# Patient Record
Sex: Female | Born: 1945
Health system: Southern US, Community
[De-identification: ages and names within clinical notes are randomized; demographics above are authoritative.]

## PROBLEM LIST (undated history)

## (undated) DIAGNOSIS — F0390 Unspecified dementia without behavioral disturbance: Secondary | ICD-10-CM

## (undated) DIAGNOSIS — E785 Hyperlipidemia, unspecified: Secondary | ICD-10-CM

## (undated) DIAGNOSIS — I10 Essential (primary) hypertension: Secondary | ICD-10-CM

## (undated) HISTORY — DX: Unspecified dementia, unspecified severity, without behavioral disturbance, psychotic disturbance, mood disturbance, and anxiety: F03.90

## (undated) HISTORY — DX: Hyperlipidemia, unspecified: E78.5

## (undated) HISTORY — DX: Essential (primary) hypertension: I10

---

## 2013-03-15 DIAGNOSIS — F919 Conduct disorder, unspecified: Secondary | ICD-10-CM | POA: Diagnosis not present

## 2013-03-15 DIAGNOSIS — F329 Major depressive disorder, single episode, unspecified: Secondary | ICD-10-CM | POA: Diagnosis not present

## 2013-03-15 DIAGNOSIS — F3289 Other specified depressive episodes: Secondary | ICD-10-CM | POA: Diagnosis not present

## 2013-03-17 DIAGNOSIS — F3289 Other specified depressive episodes: Secondary | ICD-10-CM | POA: Diagnosis not present

## 2013-03-17 DIAGNOSIS — F0280 Dementia in other diseases classified elsewhere without behavioral disturbance: Secondary | ICD-10-CM | POA: Diagnosis not present

## 2013-03-17 DIAGNOSIS — F509 Eating disorder, unspecified: Secondary | ICD-10-CM | POA: Diagnosis not present

## 2013-03-17 DIAGNOSIS — Z560 Unemployment, unspecified: Secondary | ICD-10-CM | POA: Diagnosis not present

## 2013-03-17 DIAGNOSIS — F339 Major depressive disorder, recurrent, unspecified: Secondary | ICD-10-CM | POA: Diagnosis not present

## 2013-03-17 DIAGNOSIS — G309 Alzheimer's disease, unspecified: Secondary | ICD-10-CM | POA: Diagnosis not present

## 2013-03-17 DIAGNOSIS — R636 Underweight: Secondary | ICD-10-CM | POA: Diagnosis not present

## 2013-03-17 DIAGNOSIS — Z681 Body mass index (BMI) 19 or less, adult: Secondary | ICD-10-CM | POA: Diagnosis not present

## 2013-03-17 DIAGNOSIS — F329 Major depressive disorder, single episode, unspecified: Secondary | ICD-10-CM | POA: Diagnosis not present

## 2013-06-12 DIAGNOSIS — F068 Other specified mental disorders due to known physiological condition: Secondary | ICD-10-CM | POA: Diagnosis not present

## 2013-06-12 DIAGNOSIS — Z0000 Encounter for general adult medical examination without abnormal findings: Secondary | ICD-10-CM | POA: Diagnosis not present

## 2013-06-25 DIAGNOSIS — F329 Major depressive disorder, single episode, unspecified: Secondary | ICD-10-CM | POA: Diagnosis not present

## 2013-06-25 DIAGNOSIS — F3289 Other specified depressive episodes: Secondary | ICD-10-CM | POA: Diagnosis not present

## 2013-09-15 DIAGNOSIS — F0393 Unspecified dementia, unspecified severity, with mood disturbance: Secondary | ICD-10-CM | POA: Diagnosis not present

## 2013-09-15 DIAGNOSIS — F0390 Unspecified dementia without behavioral disturbance: Secondary | ICD-10-CM | POA: Diagnosis not present

## 2013-12-16 ENCOUNTER — Ambulatory Visit: Payer: Medicare Other | Attending: Family Medicine | Admitting: Family Medicine

## 2013-12-16 ENCOUNTER — Encounter: Payer: Self-pay | Admitting: Family Medicine

## 2013-12-16 VITALS — BP 122/71 | HR 72 | Temp 98.0°F | Resp 18 | Ht 68.0 in | Wt 113.0 lb

## 2013-12-16 DIAGNOSIS — Z23 Encounter for immunization: Secondary | ICD-10-CM

## 2013-12-16 DIAGNOSIS — F0390 Unspecified dementia without behavioral disturbance: Secondary | ICD-10-CM | POA: Insufficient documentation

## 2013-12-16 DIAGNOSIS — E785 Hyperlipidemia, unspecified: Secondary | ICD-10-CM

## 2013-12-16 DIAGNOSIS — F0391 Unspecified dementia with behavioral disturbance: Secondary | ICD-10-CM | POA: Diagnosis not present

## 2013-12-16 DIAGNOSIS — I10 Essential (primary) hypertension: Secondary | ICD-10-CM

## 2013-12-16 DIAGNOSIS — Z760 Encounter for issue of repeat prescription: Secondary | ICD-10-CM | POA: Insufficient documentation

## 2013-12-16 LAB — LIPID PANEL
Cholesterol: 220 mg/dL — ABNORMAL HIGH (ref 0–200)
HDL: 61 mg/dL (ref >39)
LDL CALC: 139 mg/dL — AB (ref 0–99)
TRIGLYCERIDES: 98 mg/dL (ref <150)
Total CHOL/HDL Ratio: 3.6 Ratio
VLDL: 20 mg/dL (ref 0–40)

## 2013-12-16 LAB — COMPLETE METABOLIC PANEL WITHOUT GFR
ALT: 21 U/L (ref 0–35)
AST: 29 U/L (ref 0–37)
Albumin: 4.7 g/dL (ref 3.5–5.2)
Alkaline Phosphatase: 63 U/L (ref 39–117)
BUN: 21 mg/dL (ref 6–23)
CO2: 30 meq/L (ref 19–32)
Calcium: 9.5 mg/dL (ref 8.4–10.5)
Chloride: 100 meq/L (ref 96–112)
Creat: 0.94 mg/dL (ref 0.50–1.10)
GFR, Est African American: 73 mL/min
GFR, Est Non African American: 63 mL/min
Glucose, Bld: 137 mg/dL — ABNORMAL HIGH (ref 70–99)
Potassium: 4.4 meq/L (ref 3.5–5.3)
Sodium: 141 meq/L (ref 135–145)
Total Bilirubin: 0.5 mg/dL (ref 0.2–1.2)
Total Protein: 7.9 g/dL (ref 6.0–8.3)

## 2013-12-16 LAB — VITAMIN B12: Vitamin B-12: 1316 pg/mL — ABNORMAL HIGH (ref 211–911)

## 2013-12-16 MED ORDER — SIMVASTATIN 20 MG PO TABS: 20.0000 mg | ORAL_TABLET | Freq: Every day | ORAL | Status: DC

## 2013-12-16 MED ORDER — HYDROCHLOROTHIAZIDE 25 MG PO TABS: 25.0000 mg | ORAL_TABLET | Freq: Every day | ORAL | Status: DC

## 2013-12-16 MED ORDER — DONEPEZIL HCL 10 MG PO TABS: 10.0000 mg | ORAL_TABLET | Freq: Every day | ORAL | Status: DC

## 2013-12-16 NOTE — Assessment & Plan Note (Addendum)
A: normal BP P:  Refilled HCTZ CMP

## 2013-12-16 NOTE — Assessment & Plan Note (Signed)
A: stable on aricept. Check B12, HIV.  P: Refilled aricept  MMSE at f/u

## 2013-12-16 NOTE — Progress Notes (Signed)
Establish care Medicine refill
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
## 2013-12-16 NOTE — Assessment & Plan Note (Signed)
A: on statin P: Check lipids Refill simvastatin

## 2013-12-16 NOTE — Patient Instructions (Signed)
Deanna [name],  Thank you for coming in today. It was a pleasure meeting you. I look forward to being your primary doctor.   I have sent refills for your medications to the onsite pharmacy.   You will be called with lab results  F/u in 3 months for hypertension and dementia, sooner if needed.   Recommend screening: mammogram and colonoscopy.   Dr. Adrian Blackwater

## 2013-12-17 LAB — HIV ANTIBODY (ROUTINE TESTING W REFLEX): HIV 1&2 Ab, 4th Generation: NONREACTIVE

## 2013-12-22 ENCOUNTER — Telehealth: Payer: Self-pay | Admitting: Family Medicine

## 2013-12-22 ENCOUNTER — Telehealth: Payer: Self-pay | Admitting: *Deleted

## 2013-12-22 NOTE — Telephone Encounter (Signed)
Message copied by Betti Cruz on Tue Dec 22, 2013  2:05 PM ------      Message from: Boykin Nearing      Created: Tue Dec 22, 2013 12:09 PM       Screening for dementia labs: HIV negative, B12 normal       CMP- normal       Cholesterol high, continue simvastatin, we will repeat LDL  in 6 months ------

## 2013-12-22 NOTE — Telephone Encounter (Signed)
Pt returning nurse's call, please f/u with pt.   °

## 2013-12-22 NOTE — Telephone Encounter (Signed)
Unable to LVM, voice message full

## 2013-12-24 ENCOUNTER — Telehealth: Payer: Self-pay | Admitting: Family Medicine

## 2013-12-24 NOTE — Telephone Encounter (Signed)
Pt's son calling back in regards to phone call the pt. Received from [place], please f/u with pt.

## 2013-12-25 ENCOUNTER — Telehealth: Payer: Self-pay | Admitting: Emergency Medicine

## 2013-12-25 NOTE — Telephone Encounter (Signed)
Pt son given labs results with instructions to diet/exercise

## 2014-03-11 ENCOUNTER — Telehealth: Payer: Self-pay | Admitting: Family Medicine

## 2014-03-11 ENCOUNTER — Encounter: Payer: Self-pay | Admitting: *Deleted

## 2014-03-11 ENCOUNTER — Telehealth: Payer: Self-pay | Admitting: *Deleted

## 2014-03-11 ENCOUNTER — Ambulatory Visit: Payer: Medicare Other | Attending: Family Medicine | Admitting: Family Medicine

## 2014-03-11 ENCOUNTER — Encounter: Payer: Self-pay | Admitting: Family Medicine

## 2014-03-11 VITALS — BP 107/72 | HR 78 | Temp 97.5°F | Resp 16 | Ht 68.0 in | Wt 110.0 lb

## 2014-03-11 DIAGNOSIS — R634 Abnormal weight loss: Secondary | ICD-10-CM | POA: Diagnosis not present

## 2014-03-11 DIAGNOSIS — F0390 Unspecified dementia without behavioral disturbance: Secondary | ICD-10-CM | POA: Insufficient documentation

## 2014-03-11 DIAGNOSIS — H43391 Other vitreous opacities, right eye: Secondary | ICD-10-CM | POA: Diagnosis not present

## 2014-03-11 DIAGNOSIS — R636 Underweight: Secondary | ICD-10-CM | POA: Diagnosis not present

## 2014-03-11 DIAGNOSIS — I10 Essential (primary) hypertension: Secondary | ICD-10-CM | POA: Diagnosis not present

## 2014-03-11 DIAGNOSIS — E785 Hyperlipidemia, unspecified: Secondary | ICD-10-CM | POA: Insufficient documentation

## 2014-03-11 MED ORDER — ENSURE PLUS PO LIQD: 237.0000 mL | Freq: Two times a day (BID) | ORAL | Status: DC

## 2014-03-11 MED ORDER — MEMANTINE HCL 5 MG PO TABS
5.0000 mg | ORAL_TABLET | Freq: Two times a day (BID) | ORAL | Status: DC
Start: 2014-03-11 — End: 2014-03-11

## 2014-03-11 MED ORDER — HYDROCHLOROTHIAZIDE 12.5 MG PO TABS: 12.5000 mg | ORAL_TABLET | Freq: Every day | ORAL | Status: DC

## 2014-03-11 NOTE — Progress Notes (Signed)
   Subjective:    Patient ID: Deanna [name], female    DOB: [birth year], 69 y.o.   MRN: [id number] CC: f/u HTN, HLD,  and dementia  HPI 69 yo F presents with her son whom she lives with for f/u:  1. HTN: taking HCTZ. No headache, chest pain or shortness of breath. No syncope, presyncope or dizziness.   2. Dementia: taking aricept. Has some confusion around the house, like when walking to restroom. No mood lability. Cannot recall recent events clearly. Living with son. Doing crossword puzzles. Planning to volunteer outside of the home. Appetite is fair. Was taking ensure supplements prior to 2 month visit to [place] to stay with her sisters. Interested in seeing as neurologist for additional evaluation and treatment of dementia.   3. Floaters in R eye: x 2-3 weeks. No pain. No flashing lights. No loss of vision. Has worn reading glasses in the past.   Soc Hx: non smoker  Review of Systems As per HPI     Objective:   Physical Exam BP 107/72 mmHg  Pulse 78  Temp(Src) 97.5 F (36.4 C) (Oral)  Resp 16  Ht 5\' 8"  (1.727 m)  Wt 110 lb (49.896 kg)  BMI 16.73 kg/m2  SpO2 99%  Wt Readings from Last 3 Encounters:  03/11/14 110 lb (49.896 kg)  12/16/13 113 lb (51.256 kg)    BP Readings from Last 3 Encounters:  03/11/14 107/72  12/16/13 122/71  General appearance: alert, cooperative, appears older than stated age and no distress, thin  Eyes: negative findings: lids and lashes normal and conjunctivae and sclerae normal, positive findings: pupillary abnormality: constricted b/l, slightly cloudy on the R.  Lungs: clear to auscultation bilaterally Heart: regular rate and rhythm, S1, S2 normal, no murmur, click, rub or gallop Extremities: extremities normal, atraumatic, no cyanosis or edema  Visual acuity 20/50 both eyes.       Assessment & Plan:

## 2014-03-11 NOTE — Progress Notes (Signed)
Pt comes in for 3 mnth f/u Htn,Cholesterol and Dementia Pt is compliant with taking medications daily Need refills on all meds Son is requesting Neuro referral for worsening Dementia Poor appetite noted with 3lb weight loss from last visit

## 2014-03-11 NOTE — Patient Instructions (Signed)
Deanna [name],  Thank you for coming back to see me,   1. HTN: BP very well controlled. If you develop dizziness, lightheadedness please call me right away.  Drink plenty of water. Due for repeat labs in 06/2013.   2. Dementia: Adding namenda with goal of slowing progression, 5 mg once daiky for 10 days then twice daily (max dose is 20 mg daily divided into 10 mg doses).  Continue aricept Continue stimulating activites Neurology referral.  3. Floaters in R eye:  Opthalmology referral for dilated eye exam to rule out cataract and any potential problem in the back of your eye.  4. Weight loss: Add ensure 20 minutes before meals for appetite stimulation.   F/u in 3 months  Dr. Adrian Blackwater

## 2014-03-11 NOTE — Assessment & Plan Note (Signed)
A: Dementia: persistent. Suspect slight progression. Still able to perform ADLs.  P: presribed namenda, but the co-pay was around $300. namenda on hold for now.  Continue aricept Continue stimulating activities, adding more community activities.  Neurology referral.

## 2014-03-11 NOTE — Assessment & Plan Note (Signed)
A: BP well controlled. No signs of orthostasis. SBP down to 107 from 122.  P: Continue HCTZ, decrease to 12.5 mg daily.

## 2014-03-11 NOTE — Assessment & Plan Note (Signed)
Weight loss: Add ensure 20 minutes before meals for appetite stimulation.

## 2014-03-11 NOTE — Telephone Encounter (Signed)
Attempted to call patient. Unable to leave VM as mailbox full. Changes to treatment plan   1. Regarding dementia: since the namenda co-pay was so high (around $300) we will not start this medication now.  Perhaps the neurologist may have a more affordable medication suggestion or other treatment options. Referral placed.   2. Regarding BP: Well controlled But given SBP lower than previous 122 last visit to 107 today, please decrease HCTZ to 12.5 mg ( 1/2 of 25 mg tablet) daily. Dr. Adrian Blackwater does not want Deanna [name] to experience symptoms of low BP.   Call with questions.  Dr. Adrian Blackwater

## 2014-03-11 NOTE — Telephone Encounter (Signed)
Unable to contact Pt, voice mail is full Letter send to pt

## 2014-03-24 ENCOUNTER — Ambulatory Visit (INDEPENDENT_AMBULATORY_CARE_PROVIDER_SITE_OTHER): Payer: Medicare Other | Admitting: Neurology

## 2014-03-24 ENCOUNTER — Encounter: Payer: Self-pay | Admitting: Neurology

## 2014-03-24 VITALS — BP 112/65 | HR 77 | Ht 68.0 in | Wt 112.8 lb

## 2014-03-24 DIAGNOSIS — R300 Dysuria: Secondary | ICD-10-CM

## 2014-03-24 DIAGNOSIS — R5383 Other fatigue: Secondary | ICD-10-CM | POA: Insufficient documentation

## 2014-03-24 DIAGNOSIS — R4189 Other symptoms and signs involving cognitive functions and awareness: Secondary | ICD-10-CM | POA: Insufficient documentation

## 2014-03-24 DIAGNOSIS — R5382 Chronic fatigue, unspecified: Secondary | ICD-10-CM | POA: Diagnosis not present
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
## 2014-03-24 NOTE — Patient Instructions (Signed)
Overall you are doing fairly well but I do want to suggest a few things today:   Remember to drink plenty of fluid, eat healthy meals and do not skip any meals. Try to eat protein with a every meal and eat a healthy snack such as fruit or nuts in between meals. Try to keep a regular sleep-wake schedule and try to exercise daily, particularly in the form of walking, 20-30 minutes a day, if you can.   As far as your medications are concerned, I would like to suggest: Holding off on any new medications until workup is complete.   As far as diagnostic testing: MRI of the brain, lab tests, Neuropsychiatric testing  I would like to see you back in 3 months, sooner if we need to. Please call us with any interim questions, concerns, problems, updates or refill requests.   Please also call us for any test results so we can go over those with you on the phone.  My clinical assistant and will answer any of your questions and relay your messages to me and also relay most of my messages to you.   Our phone number is [phone]. We also have an after hours call service for urgent matters and there is a physician on-call for urgent questions. For any emergencies you know to call 911 or go to the nearest emergency room

## 2014-03-25 LAB — URINALYSIS, ROUTINE W REFLEX MICROSCOPIC
Bilirubin, UA: NEGATIVE
GLUCOSE, UA: NEGATIVE
Leukocytes, UA: NEGATIVE
NITRITE UA: NEGATIVE
Specific Gravity, UA: 1.027 (ref 1.005–1.030)
Urobilinogen, Ur: 0.2 mg/dL (ref 0.2–1.0)
pH, UA: 5.5 (ref 5.0–7.5)

## 2014-03-25 LAB — MICROSCOPIC EXAMINATION: Casts: NONE SEEN /lpf

## 2014-03-25 LAB — SPECIMEN STATUS REPORT

## 2014-03-26 LAB — CBC
HEMATOCRIT: 44 % (ref 34.0–46.6)
HEMOGLOBIN: 14.4 g/dL (ref 11.1–15.9)
MCH: 29.8 pg (ref 26.6–33.0)
MCHC: 32.7 g/dL (ref 31.5–35.7)
MCV: 91 fL (ref 79–97)
Platelets: 373 10*3/uL (ref 150–379)
RBC: 4.84 x10E6/uL (ref 3.77–5.28)
RDW: 13.3 % (ref 12.3–15.4)
WBC: 6.1 10*3/uL (ref 3.4–10.8)

## 2014-03-26 LAB — COMPREHENSIVE METABOLIC PANEL
ALT: 17 IU/L (ref 0–32)
AST: 26 IU/L (ref 0–40)
Albumin/Globulin Ratio: 1.8 (ref 1.1–2.5)
Albumin: 4.9 g/dL — ABNORMAL HIGH (ref 3.6–4.8)
Alkaline Phosphatase: 68 IU/L (ref 39–117)
BUN / CREAT RATIO: 17 (ref 11–26)
BUN: 19 mg/dL (ref 8–27)
CO2: 29 mmol/L (ref 18–29)
CREATININE: 1.11 mg/dL — AB (ref 0.57–1.00)
Calcium: 10.4 mg/dL — ABNORMAL HIGH (ref 8.7–10.3)
Chloride: 97 mmol/L (ref 97–108)
GFR calc non Af Amer: 52 mL/min/{1.73_m2} — ABNORMAL LOW (ref >59)
GFR, EST AFRICAN AMERICAN: 60 mL/min/{1.73_m2} (ref >59)
GLUCOSE: 114 mg/dL — AB (ref 65–99)
Globulin, Total: 2.8 g/dL (ref 1.5–4.5)
POTASSIUM: 5.6 mmol/L — AB (ref 3.5–5.2)
Sodium: 144 mmol/L (ref 134–144)
TOTAL PROTEIN: 7.7 g/dL (ref 6.0–8.5)
Total Bilirubin: 0.5 mg/dL (ref 0.0–1.2)

## 2014-03-26 LAB — THYROID PANEL WITH TSH
Free Thyroxine Index: 2.8 (ref 1.2–4.9)
T3 Uptake Ratio: 33 % (ref 24–39)
T4, Total: 8.4 ug/dL (ref 4.5–12.0)
TSH: 2.7 u[IU]/mL (ref 0.450–4.500)

## 2014-03-26 LAB — AMMONIA: Ammonia: 165 ug/dL (ref 19–87)

## 2014-03-26 LAB — VITAMIN B1, WHOLE BLOOD: Thiamine: 124.1 nmol/L (ref 66.5–200.0)

## 2014-03-26 LAB — SYPHILIS: RPR WITH REFLEX TO RPR TITER: RPR Ser Ql: NONREACTIVE

## 2014-03-26 LAB — SEDIMENTATION RATE: SED RATE: 6 mm/h (ref 0–40)

## 2014-03-26 LAB — METHYLMALONIC ACID, SERUM: Methylmalonic Acid: 167 nmol/L (ref 0–378)

## 2014-03-26 LAB — SPECIMEN STATUS REPORT

## 2014-04-05 ENCOUNTER — Telehealth: Payer: Self-pay | Admitting: Neurology

## 2014-04-05 NOTE — Telephone Encounter (Signed)
Deanna [name] - I have tried calling this patient a few times and and the voicemail is full so I can't leave a message. Can you try again tomorrow and if they do not answer, we will need to send them a letter. Her Ammonia level is high. I wanted to ask patient some more questions to figure out why that it (she doesn't have liver disease as far as I know but other things can cause high ammonia levels (GI bleeding, renal disease, alcohol, narcotics and others). This may the cause of her encephalopathy. I would like to repeat the test if you can get through to the patient or her son. Otherwise lets send a letter with the results and see if she can follow up with her primary care. Thank you.

## 2014-04-06 ENCOUNTER — Ambulatory Visit
Admission: RE | Admit: 2014-04-06 | Discharge: 2014-04-06 | Disposition: A | Discharge status: Home / Self Care | Payer: Medicare Other | Source: Ambulatory Visit | Attending: Neurology | Admitting: Neurology

## 2014-04-06 DIAGNOSIS — R4189 Other symptoms and signs involving cognitive functions and awareness: Secondary | ICD-10-CM

## 2014-04-06 NOTE — Telephone Encounter (Signed)
Tried calling patient. Voicemail inbox is full and I was unable to leave a message. I will go ahead and send a letter regarding lab results and to follow up were her PCP.

## 2014-04-07 LAB — COMPREHENSIVE DRUG ANALYSIS,UR: PDF: 0

## 2014-04-09 ENCOUNTER — Encounter: Payer: Self-pay | Admitting: *Deleted

## 2014-04-09 NOTE — Progress Notes (Signed)
Printed out MRI results and will mail them to the patient.

## 2014-04-16 NOTE — Telephone Encounter (Signed)
Pt's son is returning your call regarding lab results.  Please call back and advise.

## 2014-04-20 ENCOUNTER — Telehealth: Payer: Self-pay | Admitting: Family Medicine

## 2014-04-20 NOTE — Telephone Encounter (Signed)
Pt calling for MRI results, please f/u with pt.

## 2014-04-20 NOTE — Telephone Encounter (Signed)
Dr. Jaynee Eagles tried calling again and left a message with the lab results and MRI brain results. Told them to have her follow up with her PCP.

## 2014-04-25 NOTE — Telephone Encounter (Signed)
Patient to call [facility] [phone]  For results They have been trying to get ahold of patient to give results

## 2014-04-26 DIAGNOSIS — H43393 Other vitreous opacities, bilateral: Secondary | ICD-10-CM | POA: Diagnosis not present

## 2014-04-26 DIAGNOSIS — H2513 Age-related nuclear cataract, bilateral: Secondary | ICD-10-CM | POA: Diagnosis not present

## 2014-04-26 NOTE — Telephone Encounter (Signed)
Stated has results already

## 2014-05-07 ENCOUNTER — Telehealth: Payer: Self-pay | Admitting: Family Medicine

## 2014-05-07 ENCOUNTER — Telehealth: Payer: Self-pay | Admitting: Emergency Medicine

## 2014-05-07 NOTE — Telephone Encounter (Signed)
Called patient's son to clarify reason for FL2 form- [facility] vs SNF vs home care.  Patient and son pursuing ICF for dementia.   Please call patient's son.  FL2 for and copy of MRI report ready for pick up

## 2014-05-07 NOTE — Telephone Encounter (Signed)
FL2  

## 2014-05-11 NOTE — Telephone Encounter (Signed)
Patient called office and wanted the FL-2 faxed to  [phone] Attn [place] [facility] [place], same was sent.

## 2014-05-17 ENCOUNTER — Ambulatory Visit: Payer: Medicare Other

## 2014-05-19 ENCOUNTER — Ambulatory Visit: Payer: Medicare Other

## 2014-05-26 ENCOUNTER — Telehealth: Payer: Self-pay | Admitting: Emergency Medicine

## 2014-05-26 ENCOUNTER — Encounter: Payer: Self-pay | Admitting: Emergency Medicine

## 2014-05-26 NOTE — Telephone Encounter (Signed)
From [facility], ref state compliance order.

## 2014-06-10 ENCOUNTER — Ambulatory Visit: Payer: Medicare Other | Attending: Psychology | Admitting: Psychology

## 2014-06-10 DIAGNOSIS — F03A0 Unspecified dementia, mild, without behavioral disturbance, psychotic disturbance, mood disturbance, and anxiety: Secondary | ICD-10-CM

## 2014-06-10 DIAGNOSIS — F0390 Unspecified dementia without behavioral disturbance: Secondary | ICD-10-CM | POA: Diagnosis not present
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
## 2014-06-11 ENCOUNTER — Encounter: Payer: Self-pay | Admitting: Psychology

## 2014-06-15 ENCOUNTER — Telehealth: Payer: Self-pay | Admitting: Family Medicine

## 2014-06-15 ENCOUNTER — Ambulatory Visit: Payer: Medicare Other | Attending: Family Medicine | Admitting: Family Medicine

## 2014-06-15 ENCOUNTER — Encounter: Payer: Self-pay | Admitting: Family Medicine

## 2014-06-15 VITALS — BP 123/73 | HR 73 | Temp 98.3°F | Resp 18 | Ht 68.0 in | Wt 123.0 lb

## 2014-06-15 DIAGNOSIS — R252 Cramp and spasm: Secondary | ICD-10-CM | POA: Insufficient documentation

## 2014-06-15 DIAGNOSIS — E7220 Disorder of urea cycle metabolism, unspecified: Secondary | ICD-10-CM | POA: Insufficient documentation

## 2014-06-15 DIAGNOSIS — R7989 Other specified abnormal findings of blood chemistry: Secondary | ICD-10-CM | POA: Diagnosis not present

## 2014-06-15 DIAGNOSIS — F0390 Unspecified dementia without behavioral disturbance: Secondary | ICD-10-CM

## 2014-06-15 DIAGNOSIS — R109 Unspecified abdominal pain: Secondary | ICD-10-CM

## 2014-06-15 DIAGNOSIS — I10 Essential (primary) hypertension: Secondary | ICD-10-CM

## 2014-06-15 DIAGNOSIS — R799 Abnormal finding of blood chemistry, unspecified: Secondary | ICD-10-CM | POA: Diagnosis not present

## 2014-06-15 LAB — COMPLETE METABOLIC PANEL WITH GFR
ALK PHOS: 61 U/L (ref 39–117)
ALT: 54 U/L — ABNORMAL HIGH (ref 0–35)
AST: 33 U/L (ref 0–37)
Albumin: 4.4 g/dL (ref 3.5–5.2)
BUN: 18 mg/dL (ref 6–23)
CALCIUM: 9.5 mg/dL (ref 8.4–10.5)
CO2: 30 mEq/L (ref 19–32)
Chloride: 103 mEq/L (ref 96–112)
Creat: 0.91 mg/dL (ref 0.50–1.10)
GFR, EST NON AFRICAN AMERICAN: 65 mL/min
GFR, Est African American: 76 mL/min
Glucose, Bld: 90 mg/dL (ref 70–99)
POTASSIUM: 5.1 meq/L (ref 3.5–5.3)
Sodium: 143 mEq/L (ref 135–145)
Total Bilirubin: 0.5 mg/dL (ref 0.2–1.2)
Total Protein: 7.4 g/dL (ref 6.0–8.3)

## 2014-06-15 LAB — POCT URINALYSIS DIPSTICK
Bilirubin, UA: NEGATIVE
Glucose, UA: NEGATIVE
KETONES UA: NEGATIVE
LEUKOCYTES UA: NEGATIVE
Nitrite, UA: NEGATIVE
PH UA: 5.5
PROTEIN UA: NEGATIVE
SPEC GRAV UA: 1.025
Urobilinogen, UA: 1

## 2014-06-15 NOTE — Patient Instructions (Addendum)
  1. Elevated ammonia level: No asterixis on exam. Thought content is normal. Following instructions well.  Repeating  ammonia level, CMP-pending  UA Urine culture -pending  FOBT- negative   2. Hand cramping: Please stop hydrochlorothiazide. Blood pressure checks daily at [facility] for the next week.  Goal BP < 150/90 Will need a different BP medicine (norvasc 2.5 mg)  if BP > 150/90 x 2 days   You will be called with results  F/u in 3 months   Dr. Adrian Blackwater

## 2014-06-15 NOTE — Assessment & Plan Note (Signed)
A: nomotensive today with hand cramping. P: d/c HCTZ

## 2014-06-15 NOTE — Progress Notes (Signed)
F/U Complaining on cramping on fingers

## 2014-06-15 NOTE — Progress Notes (Signed)
   Subjective:    Patient ID: Deanna [name], female    DOB: October 26, 1945, 69 y.o.   MRN: [id number] CC: elevated ammonia levels, hand cramping  HPI 69 yo F with dementia, presents alone for OV brought in by driver from [facility]   1. Elevated ammonia level: denies confusion, dysuria, blood in stool.  2. Hand cramping: when holding the phone or gripping for prolonged time. Fingers only. No numbness. Just cramping.   Soc Hx: non smoker  Review of Systems  Constitutional: Negative.   Gastrointestinal: Negative for diarrhea, constipation and blood in stool.  Genitourinary: Positive for frequency. Negative for dysuria.  Musculoskeletal: Positive for myalgias.  Neurological: Negative.        Objective:   Physical Exam BP 123/73 mmHg  Pulse 73  Temp(Src) 98.3 F (36.8 C) (Oral)  Resp 18  Ht 5\' 8"  (1.727 m)  Wt 123 lb (55.792 kg)  BMI 18.71 kg/m2  SpO2 100%  Wt Readings from Last 3 Encounters:  06/15/14 123 lb (55.792 kg)  04/06/14 113 lb (51.256 kg)  03/24/14 112 lb 12.8 oz (51.166 kg)   BP Readings from Last 3 Encounters:  06/15/14 123/73  03/24/14 112/65  03/11/14 107/72    General appearance: alert, cooperative and no distress Eyes: conjunctivae/corneas clear. PERRL, EOM's intact.  Throat: lips, mucosa, and tongue normal; teeth and gums normal Back: mild L flank tenderness Lungs: clear to auscultation bilaterally Heart: regular rate and rhythm, S1, S2 normal, no murmur, click, rub or gallop Abdomen: soft, non-tender; bowel sounds normal; no masses,  no organomegaly  Rectal: normal tone, guaic negative stool in rectal vault  Neurologic: Alert and oriented X 3, normal strength and tone. Normal symmetric reflexes. Normal coordination and gait, negative asterixis       Assessment & Plan:

## 2014-06-15 NOTE — Assessment & Plan Note (Signed)
  1. Elevated ammonia level: No asterixis on exam. Thought content is normal. Following instructions well.  Repeating  ammonia level, CMP-pending  UA Urine culture -pending  FOBT- negative

## 2014-06-15 NOTE — Telephone Encounter (Signed)
Patient's son called back. I gave him an update of the plan and evaluation of elevated ammonia level (see office note). Also gave feedback about my concern that she came unaccompanied to the visit.  Son voices appreciation of feedback. He will likely come with his mom with future appointments.

## 2014-06-15 NOTE — Telephone Encounter (Signed)
Called patient's son. Left VM.  Stable exam today.  Evaluating elevated ammonia level- repeat blood work, UA and U culture, FOBT. Stopping HCTZ due to hand cramping.

## 2014-06-16 LAB — URINE CULTURE
COLONY COUNT: NO GROWTH
Organism ID, Bacteria: NO GROWTH

## 2014-06-16 LAB — AMMONIA: Ammonia: 60 umol/L — ABNORMAL HIGH (ref 16–53)

## 2014-06-17 ENCOUNTER — Telehealth: Payer: Self-pay | Admitting: *Deleted

## 2014-06-17 NOTE — Telephone Encounter (Signed)
-----   Message from Boykin Nearing, MD sent at 06/16/2014  9:04 AM EDT ----- Normal CMP except slightly elevated ALT, liver transaminase

## 2014-06-17 NOTE — Telephone Encounter (Signed)
Left voice message to return call 

## 2014-06-17 NOTE — Telephone Encounter (Signed)
-----   Message from Boykin Nearing, MD sent at 06/16/2014  2:22 PM EDT ----- Ammonia level down to 60. This is just slightly elevated. At normal ranges is 16-53. As long as urine culture normal, no further w/u needed

## 2014-07-20 ENCOUNTER — Encounter: Payer: Self-pay | Admitting: Neurology

## 2014-07-20 ENCOUNTER — Ambulatory Visit (INDEPENDENT_AMBULATORY_CARE_PROVIDER_SITE_OTHER): Payer: Medicare Other | Admitting: Neurology

## 2014-07-20 VITALS — BP 139/70 | HR 76 | Temp 98.1°F | Ht 68.0 in | Wt 132.8 lb

## 2014-07-20 DIAGNOSIS — F0390 Unspecified dementia without behavioral disturbance: Secondary | ICD-10-CM
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
## 2014-11-09 ENCOUNTER — Telehealth: Payer: Self-pay | Admitting: Family Medicine

## 2014-11-09 NOTE — Telephone Encounter (Signed)
Called [facility] to clarify request for flu shot on site during flu clinic scheduled for 12/13/14  Signed request for shot and faxed

## 2014-12-14 DIAGNOSIS — Z23 Encounter for immunization: Secondary | ICD-10-CM | POA: Diagnosis not present

## 2015-04-18 ENCOUNTER — Other Ambulatory Visit: Payer: Self-pay | Admitting: Family Medicine

## 2015-04-18 NOTE — Telephone Encounter (Signed)
Pt. Son called requesting medications refills on the following medications:  donepezil (ARICEPT) 10 MG tablet  simvastatin (ZOCOR) 20 MG tablet  Please f/u with pt.

## 2015-04-21 NOTE — Telephone Encounter (Signed)
Pt. Called requesting a med refill on the following medications:  simvastatin (ZOCOR) 20 MG tablet  donepezil (ARICEPT) 10 MG tablet   Please f/u with pt.

## 2015-04-22 MED FILL — SIMVASTATIN 20 MG TABLET: 20 | 30 days supply | Qty: 90 | Fill #0

## 2015-04-22 MED FILL — DONEPEZIL HCL 10 MG TABLET: 10 | 30 days supply | Qty: 30 | Fill #0

## 2015-05-23 MED FILL — DONEPEZIL HCL 10 MG TABLET: 10 | 30 days supply | Qty: 30 | Fill #1

## 2015-06-29 MED FILL — SIMVASTATIN 20 MG TABLET: 20 | 90 days supply | Qty: 90 | Fill #1

## 2015-07-15 MED FILL — DONEPEZIL HCL 10 MG TABLET: 10 | 30 days supply | Qty: 30 | Fill #2

## 2015-07-20 ENCOUNTER — Ambulatory Visit: Payer: Medicare Other | Admitting: Neurology

## 2015-08-03 ENCOUNTER — Encounter (HOSPITAL_COMMUNITY): Payer: Self-pay | Admitting: *Deleted

## 2015-08-03 ENCOUNTER — Emergency Department (HOSPITAL_COMMUNITY)
Admission: EM | Admit: 2015-08-03 | Discharge: 2015-08-04 | Disposition: A | Discharge status: Home / Self Care | Payer: Medicare Other | Attending: Emergency Medicine | Admitting: Emergency Medicine

## 2015-08-03 DIAGNOSIS — R441 Visual hallucinations: Secondary | ICD-10-CM | POA: Diagnosis present

## 2015-08-03 DIAGNOSIS — I10 Essential (primary) hypertension: Secondary | ICD-10-CM | POA: Diagnosis not present

## 2015-08-03 DIAGNOSIS — Z79899 Other long term (current) drug therapy: Secondary | ICD-10-CM | POA: Diagnosis not present

## 2015-08-03 DIAGNOSIS — F0391 Unspecified dementia with behavioral disturbance: Secondary | ICD-10-CM | POA: Insufficient documentation

## 2015-08-03 DIAGNOSIS — F03918 Unspecified dementia, unspecified severity, with other behavioral disturbance: Secondary | ICD-10-CM | POA: Diagnosis present

## 2015-08-03 LAB — RAPID URINE DRUG SCREEN, HOSP PERFORMED
Amphetamines: NOT DETECTED
Barbiturates: NOT DETECTED
Benzodiazepines: NOT DETECTED
COCAINE: NOT DETECTED
OPIATES: NOT DETECTED
Tetrahydrocannabinol: NOT DETECTED

## 2015-08-03 LAB — COMPREHENSIVE METABOLIC PANEL
ALBUMIN: 4.3 g/dL (ref 3.5–5.0)
ALT: 17 U/L (ref 14–54)
AST: 22 U/L (ref 15–41)
Alkaline Phosphatase: 65 U/L (ref 38–126)
Anion gap: 7 (ref 5–15)
BUN: 11 mg/dL (ref 6–20)
CHLORIDE: 106 mmol/L (ref 101–111)
CO2: 29 mmol/L (ref 22–32)
CREATININE: 0.87 mg/dL (ref 0.44–1.00)
Calcium: 8.8 mg/dL — ABNORMAL LOW (ref 8.9–10.3)
GFR calc Af Amer: >60 mL/min (ref >60)
GFR calc non Af Amer: >60 mL/min (ref >60)
GLUCOSE: 125 mg/dL — AB (ref 65–99)
POTASSIUM: 3.5 mmol/L (ref 3.5–5.1)
Sodium: 142 mmol/L (ref 135–145)
Total Bilirubin: 0.8 mg/dL (ref 0.3–1.2)
Total Protein: 7 g/dL (ref 6.5–8.1)

## 2015-08-03 LAB — URINALYSIS, ROUTINE W REFLEX MICROSCOPIC
Bilirubin Urine: NEGATIVE
GLUCOSE, UA: NEGATIVE mg/dL
Ketones, ur: NEGATIVE mg/dL
LEUKOCYTES UA: NEGATIVE
NITRITE: NEGATIVE
PH: 6 (ref 5.0–8.0)
Protein, ur: NEGATIVE mg/dL
Specific Gravity, Urine: 1.027 (ref 1.005–1.030)

## 2015-08-03 LAB — CBC
HEMATOCRIT: 40.1 % (ref 36.0–46.0)
HEMOGLOBIN: 13 g/dL (ref 12.0–15.0)
MCH: 29.3 pg (ref 26.0–34.0)
MCHC: 32.4 g/dL (ref 30.0–36.0)
MCV: 90.5 fL (ref 78.0–100.0)
Platelets: 436 10*3/uL — ABNORMAL HIGH (ref 150–400)
RBC: 4.43 MIL/uL (ref 3.87–5.11)
RDW: 13.5 % (ref 11.5–15.5)
WBC: 7.6 10*3/uL (ref 4.0–10.5)

## 2015-08-03 LAB — AMMONIA: Ammonia: 27 umol/L (ref 9–35)

## 2015-08-03 LAB — URINE MICROSCOPIC-ADD ON

## 2015-08-03 LAB — ETHANOL: Alcohol, Ethyl (B): <5 mg/dL (ref <5)

## 2015-08-03 MED ORDER — RISPERIDONE 0.5 MG PO TABS
0.5000 mg | ORAL_TABLET | Freq: Every day | ORAL | Status: DC
  Administered 2015-08-03: 0.5 mg via ORAL
  Filled 2015-08-03: qty 1

## 2015-08-03 MED ORDER — DONEPEZIL HCL 5 MG PO TABS
10.0000 mg | ORAL_TABLET | Freq: Every day | ORAL | Status: DC
  Administered 2015-08-03: 10 mg via ORAL
  Filled 2015-08-03: qty 2

## 2015-08-03 MED ORDER — LORAZEPAM 0.5 MG PO TABS
0.5000 mg | ORAL_TABLET | Freq: Two times a day (BID) | ORAL | Status: DC | PRN
  Administered 2015-08-03: 0.5 mg via ORAL
  Filled 2015-08-03: qty 1

## 2015-08-03 MED ORDER — SIMVASTATIN 20 MG PO TABS
20.0000 mg | ORAL_TABLET | Freq: Every day | ORAL | Status: DC
  Administered 2015-08-03 – 2015-08-04 (×2): 20 mg via ORAL
  Filled 2015-08-03 (×2): qty 1

## 2015-08-03 NOTE — ED Provider Notes (Signed)
CSN: [id number]     Arrival date & time 08/03/15  1250 History   First MD Initiated Contact with Patient 08/03/15 1307     Chief Complaint  Patient presents with  . Medical Clearance     (Consider location/radiation/quality/duration/timing/severity/associated sxs/prior Treatment) HPI Patient has a history of dementia. Her son reports at baseline she has problems with short-term memory recall. Yesterday symptoms were much worse with intermittent laughing and episodes of combativeness. She was having visual hallucinations and confusion. He reports she had the impression that people and the television were judging her and interacting with her. He has not observed his mother to be sick. She has not had fevers or chills. No coughing, vomiting or diarrhea. Patient is uncertain about urinary symptoms. The son reports she may have a UTI. She is only on 2 medications, Aricept and Zocor. No recent medication changes. The patient had recently been staying with her other son in [place] for approximately 8 weeks. Her son with her today reports he had picked her up in [place] this weekend. He states his brother described episodes of confusion where she was confusing the 2 brothers with each other and thinking that they were going to the [place]. Past Medical History  Diagnosis Date  . Dementia Dx 2015  . Hyperlipidemia DX 2015  . Hypertension Dx 2015   Past Surgical History  Procedure Laterality Date  . Cesarean section  1975, 1978    Family History  Problem Relation Age of Onset  . Cancer Neg Hx   . Heart disease Neg Hx   . Dementia Neg Hx    Social History  Substance Use Topics  . Smoking status: Never Smoker   . Smokeless tobacco: Never Used  . Alcohol Use: No   OB History    No data available     Review of Systems 10 Systems reviewed and are negative for acute change except as noted in the HPI.   Allergies  Review of patient's allergies indicates no known allergies.  Home  Medications   Prior to Admission medications   Medication Sig Start Date End Date Taking? Authorizing Provider  donepezil (ARICEPT) 10 MG tablet TAKE 1 TABLET BY MOUTH AT BEDTIME. 04/22/15  Yes Josalyn Funches, MD  simvastatin (ZOCOR) 20 MG tablet TAKE 1 TABLET BY MOUTH DAILY. 04/22/15  Yes Josalyn Funches, MD  citalopram (CELEXA) 10 MG tablet Take 1 tablet (10 mg total) by mouth daily. 08/04/15   Patrecia Pour, NP  ENSURE PLUS (ENSURE PLUS) LIQD Take 237 mLs by mouth 2 (two) times daily before a meal. 30 minutes before 2 meals Patient not taking: Reported on 08/03/2015 03/11/14   Boykin Nearing, MD  LORazepam (ATIVAN) 0.5 MG tablet Take 1 tablet (0.5 mg total) by mouth 2 (two) times daily as needed for anxiety (agitation). 08/04/15   Patrecia Pour, NP  risperiDONE (RISPERDAL) 0.5 MG tablet Take 1 tablet (0.5 mg total) by mouth at bedtime. 08/04/15   Patrecia Pour, NP   BP 145/87 mmHg  Pulse 97  Temp(Src) 98.2 F (36.8 C) (Oral)  Resp 16  SpO2 100% Physical Exam  Constitutional: She appears well-developed and well-nourished.  Patient is alert and calm at this time. She has no respiratory distress.  HENT:  Head: Normocephalic and atraumatic.  Mouth/Throat: Oropharynx is clear and moist.  Eyes: EOM are normal. Pupils are equal, round, and reactive to light. No scleral icterus.  Neck: Neck supple. No thyromegaly present.  Cardiovascular: Normal rate, regular  rhythm, normal heart sounds and intact distal pulses.   Pulmonary/Chest: Effort normal and breath sounds normal.  Abdominal: Soft. Bowel sounds are normal. She exhibits no distension. There is no tenderness.  Musculoskeletal: Normal range of motion. She exhibits no edema or tenderness.  Lymphadenopathy:    She has no cervical adenopathy.  Neurological: She is alert. She has normal strength. No cranial nerve deficit. She exhibits normal muscle tone. Coordination normal. GCS eye subscore is 4. GCS verbal subscore is 5. GCS motor subscore is  6.  Skin: Skin is warm, dry and intact.  Psychiatric: She has a normal mood and affect.    ED Course  Procedures (including critical care time) Labs Review Labs Reviewed  COMPREHENSIVE METABOLIC PANEL - Abnormal; Notable for the following:    Glucose, Bld 125 (*)    Calcium 8.8 (*)    All other components within normal limits  CBC - Abnormal; Notable for the following:    Platelets 436 (*)    All other components within normal limits  URINALYSIS, ROUTINE W REFLEX MICROSCOPIC (NOT AT [facility]) - Abnormal; Notable for the following:    APPearance CLOUDY (*)    Hgb urine dipstick TRACE (*)    All other components within normal limits  URINE MICROSCOPIC-ADD ON - Abnormal; Notable for the following:    Squamous Epithelial / LPF 6-30 (*)    Bacteria, UA MANY (*)    All other components within normal limits  ETHANOL  URINE RAPID DRUG SCREEN, HOSP PERFORMED  [facility]    Imaging Review No results found. I have personally reviewed and evaluated these images and lab results as part of my medical decision-making.   EKG Interpretation None      MDM   Final diagnoses:  Dementia, with behavioral disturbance   Patient is medically cleared. Vital signs are stable. Diagnostic evaluation does not suggest any infection or electrolyte derangement. On physical examination the patient is clinically well in appearance. She will be evaluated for behavioral disturbance with some degree of baseline dementia.    Charlesetta Shanks, MD 08/06/15 [phone]

## 2015-08-03 NOTE — Progress Notes (Signed)
Entered in d/c instructions [facility] on 08/17/2015 YOU HAVE A SCHEDULED 1 YR FOLLOW UP WITH ANTONIA AHEM AT 0900 08/17/15 AT [place] [phone]

## 2015-08-03 NOTE — BHH Counselor (Signed)
Per Waylan Boga, NP patient to remain in ED overnight for med clearance/ observance. Contacted charge nurse at [facility] ED and informed her of disposition. Khaliel Morey K. Nash Shearer, LPC-A, [facility]  Counselor 08/03/2015 5:46 PM

## 2015-08-03 NOTE — BH Assessment (Signed)
Tele Assessment Note   Deanna [name] is an 70 y.o. female, who presents to [name] ED accompanied by son  Deanna [name] who has durable power of attorney. Patient son states that he was concerned due to recent behaviors regarding AVH and states of confusion and combatitive states from patient earlier today and last night. Per son, patient resides with son and his wife and they were concerned about possible UTI and medical causes of recent behavior. Patient was diagnosed with dementia at or around unspecified time frame and was in hosptal for about x 2 months due to AVH and delusional states where she was first evaluated and diagnosed with dementia.  Patient denies current SI/HI or past history of and son confirms. Patient acknowledges past history of dementia and confused states with loss of memory and AVH. Patient deniess current AVH or delusions, and son confirms. However, concern was for earlier this date delusional sates from patent and AVH. Per patient and son AVH is with no command. Patient acknowledges past hospitilization inpatient psychiatric for delusional sates and AVH at  [facility]. Patient denies current or past history of outpatient psychiatric care. Patient denies hx. Of substance abuse.   Patient is dressed in scrubs and at time of assessement, pt was coherent and slightly audible yet cooperative. Patient is alert and oriented x4. Patient speech was within normal limits and motor behavior appeared normal. Patient thought process is coherent. Patient does not appear to be responding to internal stimuli. Patient was cooperative throughout the assessment and  Son states that he is agreeable to inpatient psychiatric treatment, unable to confirm with patient at current.   Diagnosis: Dimentia  Past Medical History:  Past Medical History  Diagnosis Date  . Dementia Dx 2015  . Hyperlipidemia DX 2015  . Hypertension Dx 2015    Past Surgical History  Procedure Laterality Date  .  Cesarean section  1975, 1978     Family History:  Family History  Problem Relation Age of Onset  . Cancer Neg Hx   . Heart disease Neg Hx   . Dementia Neg Hx     Social History:  reports that she has never smoked. She has never used smokeless tobacco. She reports that she does not drink alcohol or use illicit drugs.  Additional Social History:  Alcohol / Drug Use Pain Medications: SEE MAR Prescriptions: SEE MAR Over the Counter: SEE MAR History of alcohol / drug use?: No history of alcohol / drug abuse Longest period of sobriety (when/how long): N/A  CIWA: CIWA-Ar BP: 117/81 mmHg Pulse Rate: 83 COWS:    PATIENT STRENGTHS: (choose at least two) Active sense of humor Average or above average intelligence Capable of independent living  Allergies: No Known Allergies  Home Medications:  (Not in a hospital admission)  OB/GYN Status:  No LMP recorded. Patient is postmenopausal.  General Assessment Data Location of Assessment: [facility] TTS Assessment: In system Is this a Tele or Face-to-Face Assessment?: Tele Assessment Is this an Initial Assessment or a Re-assessment for this encounter?: Initial Assessment Marital status: Single Is patient pregnant?: No Pregnancy Status: No Living Arrangements: Children (lives with son/ caregiver) Can pt return to current living arrangement?: Yes Admission Status: Voluntary Is patient capable of signing voluntary admission?: Yes Referral Source: Self/Family/Friend Insurance type:  Passenger transport manager)     Crisis Care Plan Living Arrangements: Children (lives with son/ caregiver) Name of Psychiatrist: none Name of Therapist: none  Education Status Is patient currently in school?: No Current Grade:  na Highest grade of school patient has completed: na Name of school: na Contact person: Deanna [name] (Son)  Risk to self with the past 6 months Suicidal Ideation: No Has patient been a risk to self within the past 6 months prior to  admission? : No Suicidal Intent: No Has patient had any suicidal intent within the past 6 months prior to admission? : No Is patient at risk for suicide?: No Suicidal Plan?: No-Not Currently/Within Last 6 Months Has patient had any suicidal plan within the past 6 months prior to admission? : No Access to Means: No What has been your use of drugs/alcohol within the last 12 months?: na Previous Attempts/Gestures: No How many times?: 0 Other Self Harm Risks: n/a Triggers for Past Attempts: None known Intentional Self Injurious Behavior: None Family Suicide History: No Recent stressful life event(s): Other (Comment) Persecutory voices/beliefs?: No Depression: No Substance abuse history and/or treatment for substance abuse?: No Suicide prevention information given to non-admitted patients: Not applicable  Risk to Others within the past 6 months Homicidal Ideation: No-Not Currently/Within Last 6 Months Does patient have any lifetime risk of violence toward others beyond the six months prior to admission? : No Thoughts of Harm to Others: No Current Homicidal Intent: No Current Homicidal Plan: No Access to Homicidal Means: No Identified Victim: n History of harm to others?: No Assessment of Violence: None Noted Violent Behavior Description: na Does patient have access to weapons?: No Criminal Charges Pending?: No Does patient have a court date: No Is patient on probation?: No  Psychosis Hallucinations: Auditory, Visual (none current/ per son most recent episode dimenia with avh') Delusions: None noted  Mental Status Report Appearance/Hygiene: In scrubs Eye Contact: Good Motor Activity: Unremarkable Speech: Soft, Slow, Other (Comment) (slightly hard understand but audible) Level of Consciousness: Alert Mood: Ashamed/humiliated Affect: Appropriate to circumstance Anxiety Level: Moderate Thought Processes: Coherent, Relevant Judgement: Partial Orientation: Person, Place, Time,  Situation, Appropriate for developmental age Obsessive Compulsive Thoughts/Behaviors: None  Cognitive Functioning Concentration: Decreased Memory: Recent Intact, Remote Intact IQ: Average Insight: Fair Impulse Control: Fair Appetite: Good Weight Loss: 0 (0) Weight Gain: 0 Sleep: No Change Total Hours of Sleep: 5 Vegetative Symptoms: None  ADLScreening [facility] Assessment Services) Patient's cognitive ability adequate to safely complete daily activities?: Yes Patient able to express need for assistance with ADLs?: Yes Independently performs ADLs?: Yes (appropriate for developmental age)  Prior Inpatient Therapy Prior Inpatient Therapy: Yes Prior Therapy Dates: unknown Prior Therapy Facilty/Provider(s): [facility] Reason for Treatment: dimentia  Prior Outpatient Therapy Prior Outpatient Therapy: No Prior Therapy Dates: n Prior Therapy Facilty/Provider(s): n Reason for Treatment: n Does patient have an ACCT team?: No Does patient have Intensive In-House Services?  : No Does patient have [facility] services? : No Does patient have P4CC services?: No  ADL Screening (condition at time of admission) Patient's cognitive ability adequate to safely complete daily activities?: Yes Is the patient deaf or have difficulty hearing?: No Does the patient have difficulty seeing, even when wearing glasses/contacts?: No Does the patient have difficulty concentrating, remembering, or making decisions?: Yes (hx. of dimentia w/ recent episode) Patient able to express need for assistance with ADLs?: Yes Does the patient have difficulty dressing or bathing?: No Independently performs ADLs?: Yes (appropriate for developmental age) Does the patient have difficulty walking or climbing stairs?: No Weakness of Legs: None Weakness of Arms/Hands: None  Home Assistive Devices/Equipment Home Assistive Devices/Equipment: None    Abuse/Neglect Assessment (Assessment to be complete while patient  is alone) Physical Abuse: Denies Verbal Abuse: Denies Sexual Abuse: Denies Exploitation of patient/patient's resources: Denies Self-Neglect: Denies Values / Beliefs Cultural Requests During Hospitalization: None Spiritual Requests During Hospitalization: None   Advance Directives (For Healthcare) Does patient have an advance directive?: Yes Type of Advance Directive: [place] (pt son has durable power of attorney & Advance Medical Directive) Does patient want to make changes to advanced directive?: No - Patient declined Copy of advanced directive(s) in chart?: No - copy requested    Additional Information 1:1 In Past 12 Months?: No CIRT Risk: No Elopement Risk: No Does patient have medical clearance?: No (pt to be observed overnight for med clearance)     Disposition: Per Diamantina Monks, NP pt to remain overnight for medical clearance/ observation. Disposition Initial Assessment Completed for this Encounter: Yes Disposition of Patient: Other dispositions Other disposition(s):  (stay overnight med clearance)  Kristeen Mans 08/03/2015 5:19 PM

## 2015-08-03 NOTE — ED Notes (Signed)
Bed: [id number] Expected date:  Expected time:  Means of arrival:  Comments: Triage 4

## 2015-08-03 NOTE — ED Notes (Signed)
MD at bedside. Pfeiffer EDP

## 2015-08-03 NOTE — ED Notes (Addendum)
Pt's son reports yesterday pt became combative and aggressive towards family and was having visual hallucinations.  Reports hx of dementia and was dx x 2 years ago and was hospitalized in [place].  Son reports pt lives with him, his wife does not feel safe in the house with pt d/t her behavior  Yesterday.  Pt is A&O  X 3.  Pt is calm and cooperative at this time.  Son is concerned that pt might have UTI.

## 2015-08-04 DIAGNOSIS — F03918 Unspecified dementia, unspecified severity, with other behavioral disturbance: Secondary | ICD-10-CM | POA: Diagnosis present

## 2015-08-04 DIAGNOSIS — F0391 Unspecified dementia with behavioral disturbance: Secondary | ICD-10-CM

## 2015-08-04 MED ORDER — CITALOPRAM HYDROBROMIDE 10 MG PO TABS
10.0000 mg | ORAL_TABLET | Freq: Every day | ORAL | Status: DC
  Administered 2015-08-04: 10 mg via ORAL
  Filled 2015-08-04: qty 1

## 2015-08-04 MED ORDER — DONEPEZIL HCL 5 MG PO TABS: 10.0000 mg | ORAL_TABLET | Freq: Every day | ORAL | Status: DC

## 2015-08-04 MED ORDER — CITALOPRAM HYDROBROMIDE 10 MG PO TABS: 10.0000 mg | ORAL_TABLET | Freq: Every day | ORAL | Status: DC

## 2015-08-04 MED ORDER — RISPERIDONE 0.5 MG PO TABS: 0.5000 mg | ORAL_TABLET | Freq: Every day | ORAL | Status: DC

## 2015-08-04 MED ORDER — LORAZEPAM 0.5 MG PO TABS: 0.5000 mg | ORAL_TABLET | Freq: Two times a day (BID) | ORAL | Status: DC | PRN

## 2015-08-04 NOTE — ED Notes (Signed)
Pt waiting for contact with son for transport and home assistance.

## 2015-08-04 NOTE — ED Notes (Signed)
Pt started to walk out of room. Easily verbally redirected. Pt continues to be anxious about leaving

## 2015-08-04 NOTE — Consult Note (Signed)
Deanna [name] Consult   Reason for Consult:  Aggression, visual hallucination Referring Physician:  EDP Patient Identification: Deanna [name] MRN:  [id number] Principal Diagnosis: Dementia with behavioral disturbance Diagnosis:   Patient Active Problem List   Diagnosis Date Noted  . Dementia with behavioral disturbance [F03.91] 08/04/2015    Priority: High  . Increased ammonia level [R79.89] 06/15/2014  . Left flank pain [R10.9] 06/15/2014  . Cognitive changes [R41.89] 03/24/2014  . Fatigue [R53.83] 03/24/2014  . Vitreous floaters of right eye [H43.391] 03/11/2014  . Underweight [R63.6] 03/11/2014  . HTN (hypertension) [I10] 12/16/2013  . HLD (hyperlipidemia) [E78.5] 12/16/2013  . Dementia [F03.90] 12/16/2013    Total Time spent with patient: 45 minutes  Subjective:   Deanna [name] is a 70 y.o. female patient admitted with aggressive behavior.  HPI: Deanna [name] is an 70 y.o. Female with 2 year history of Dementia who was brought to [name] ED by son Deanna [name] who has durable power of attorney. Patient is a poor historian , she does not know the reason while she was brought to the hospital yesterday. Her son states that he was concerned about his mother's recent change in behavior. Patient has become more combative, aggressive and recently endorsing visual hallucinations. Today, patient seems calm, she denies SI/HI, delusional thinking, psychosis or depressive symptoms. Patient acknowledges past hospitalization inpatient psychiatric for delusional sates and AVH at [facility]. She denies drugs and alcohol abuse.  Past Psychiatric History: History of Delusional d/o  Risk to Self: Suicidal Ideation: No Suicidal Intent: No Is patient at risk for suicide?: No Suicidal Plan?: No-Not Currently/Within Last 6 Months Access to Means: No What has been your use of drugs/alcohol within the last 12 months?: na How many times?: 0 Other Self Harm Risks: n/a Triggers  for Past Attempts: None known Intentional Self Injurious Behavior: None Risk to Others: Homicidal Ideation: No-Not Currently/Within Last 6 Months Thoughts of Harm to Others: No Current Homicidal Intent: No Current Homicidal Plan: No Access to Homicidal Means: No Identified Victim: n History of harm to others?: No Assessment of Violence: None Noted Violent Behavior Description: na Does patient have access to weapons?: No Criminal Charges Pending?: No Does patient have a court date: No Prior Inpatient Therapy: Prior Inpatient Therapy: Yes Prior Therapy Dates: unknown Prior Therapy Facilty/Provider(s): [facility] Reason for Treatment: dimentia Prior Outpatient Therapy: Prior Outpatient Therapy: No Prior Therapy Dates: n Prior Therapy Facilty/Provider(s): n Reason for Treatment: n Does patient have an ACCT team?: No Does patient have Intensive In-House Services?  : No Does patient have [facility] services? : No Does patient have P4CC services?: No  Past Medical History:  Past Medical History  Diagnosis Date  . Dementia Dx 2015  . Hyperlipidemia DX 2015  . Hypertension Dx 2015    Past Surgical History  Procedure Laterality Date  . Cesarean section  1975, 1978    Family History:  Family History  Problem Relation Age of Onset  . Cancer Neg Hx   . Heart disease Neg Hx   . Dementia Neg Hx    Family Psychiatric  History: Social History:  History  Alcohol Use No     History  Drug Use No    Social History   Social History  . Marital Status: Widowed    Spouse Name: N/A  . Number of Children: 2   . Years of Education: GED    Occupational History  . 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 Smoking status: Never Smoker   . Smokeless tobacco: Never Used  . Alcohol Use: No  . Drug Use: No  . Sexual Activity: No   Other Topics Concern  . None   Social History Narrative   From [place].     Lives with son Deanna [name] moved from [place] to [place] in 11/2013.    Patient is right handed    Patient has 2 children    Patient has some college education    Patient is retired       Additional Social History:    Allergies:  No Known Allergies  Labs:  Results for orders placed or performed during the hospital encounter of 08/03/15 (from the past 48 hour(s))  Rapid urine drug screen (hospital performed)     Status: None   Collection Time: 08/03/15  1:34 PM  Result Value Ref Range   Opiates NONE DETECTED NONE DETECTED   Cocaine NONE DETECTED NONE DETECTED   Benzodiazepines NONE DETECTED NONE DETECTED   Amphetamines NONE DETECTED NONE DETECTED   Tetrahydrocannabinol NONE DETECTED NONE DETECTED   Barbiturates NONE DETECTED NONE DETECTED    Comment:        DRUG SCREEN FOR MEDICAL PURPOSES ONLY.  IF CONFIRMATION IS NEEDED FOR ANY PURPOSE, NOTIFY LAB WITHIN 5 DAYS.        LOWEST DETECTABLE LIMITS FOR URINE DRUG SCREEN Drug Class       Cutoff (ng/mL) Amphetamine      1000 Barbiturate      200 Benzodiazepine   540 Tricyclics       086 Opiates          300 Cocaine          300 THC              50   Urinalysis, Routine w reflex microscopic (not at [facility])     Status: Abnormal   Collection Time: 08/03/15  1:34 PM  Result Value Ref Range   Color, Urine YELLOW YELLOW   APPearance CLOUDY (A) CLEAR   Specific Gravity, Urine 1.027 1.005 - 1.030   pH 6.0 5.0 - 8.0   Glucose, UA NEGATIVE NEGATIVE mg/dL   Hgb urine dipstick TRACE (A) NEGATIVE   Bilirubin Urine NEGATIVE NEGATIVE   Ketones, ur NEGATIVE NEGATIVE mg/dL   Protein, ur NEGATIVE NEGATIVE mg/dL   Nitrite NEGATIVE NEGATIVE   Leukocytes, UA NEGATIVE NEGATIVE  Urine microscopic-add on     Status: Abnormal   Collection Time: 08/03/15  1:34 PM  Result Value Ref Range   Squamous Epithelial / LPF 6-30 (A) NONE SEEN   WBC, UA 0-5 0 - 5 WBC/hpf   RBC / HPF 0-5 0 - 5 RBC/hpf   Bacteria, UA MANY (A) NONE SEEN   Urine-Other MUCOUS  PRESENT   Comprehensive metabolic panel     Status: Abnormal   Collection Time: 08/03/15  1:44 PM  Result Value Ref Range   Sodium 142 135 - 145 mmol/L   Potassium 3.5 3.5 - 5.1 mmol/L   Chloride 106 101 - 111 mmol/L   CO2 29 22 - 32 mmol/L   Glucose, Bld 125 (H) 65 - 99 mg/dL   BUN 11 6 - 20 mg/dL   Creatinine, Ser 0.87 0.44 - 1.00 mg/dL   Calcium 8.8 (L) 8.9 - 10.3 mg/dL   Total Protein 7.0 6.5 - 8.1 g/dL   Albumin 4.3 3.5 -  5.0 g/dL   AST 22 15 - 41 U/L   ALT 17 14 - 54 U/L   Alkaline Phosphatase 65 38 - 126 U/L   Total Bilirubin 0.8 0.3 - 1.2 mg/dL   GFR calc non Af Amer >60 >60 mL/min   GFR calc Af Amer >60 >60 mL/min    Comment: (NOTE) The eGFR has been calculated using the CKD EPI equation. This calculation has not been validated in all clinical situations. eGFR's persistently <60 mL/min signify possible Chronic Kidney Disease.    Anion gap 7 5 - 15  cbc     Status: Abnormal   Collection Time: 08/03/15  1:44 PM  Result Value Ref Range   WBC 7.6 4.0 - 10.5 K/uL   RBC 4.43 3.87 - 5.11 MIL/uL   Hemoglobin 13.0 12.0 - 15.0 g/dL   HCT 40.1 36.0 - 46.0 %   MCV 90.5 78.0 - 100.0 fL   MCH 29.3 26.0 - 34.0 pg   MCHC 32.4 30.0 - 36.0 g/dL   RDW 13.5 11.5 - 15.5 %   Platelets 436 (H) 150 - 400 K/uL  Ethanol     Status: None   Collection Time: 08/03/15  1:47 PM  Result Value Ref Range   Alcohol, Ethyl (B) <5 <5 mg/dL    Comment:        LOWEST DETECTABLE LIMIT FOR SERUM ALCOHOL IS 5 mg/dL FOR MEDICAL PURPOSES ONLY   Ammonia     Status: None   Collection Time: 08/03/15  1:48 PM  Result Value Ref Range   Ammonia 27 9 - 35 umol/L    Current Facility-Administered Medications  Medication Dose Route Frequency Provider Last Rate Last Dose  . citalopram (CELEXA) tablet 10 mg  10 mg Oral Daily Corena Pilgrim, MD   10 mg at 08/04/15 0921  . donepezil (ARICEPT) tablet 10 mg  10 mg Oral QHS Thamas Appleyard, MD      . LORazepam (ATIVAN) tablet 0.5 mg  0.5 mg Oral BID PRN  Patrecia Pour, NP   0.5 mg at 08/03/15 2126  . risperiDONE (RISPERDAL) tablet 0.5 mg  0.5 mg Oral QHS Shemeika Starzyk, MD      . simvastatin (ZOCOR) tablet 20 mg  20 mg Oral Daily Patrecia Pour, NP   20 mg at 08/04/15 6734   Current Outpatient Prescriptions  Medication Sig Dispense Refill  . donepezil (ARICEPT) 10 MG tablet TAKE 1 TABLET BY MOUTH AT BEDTIME. 90 tablet 1  . simvastatin (ZOCOR) 20 MG tablet TAKE 1 TABLET BY MOUTH DAILY. 90 tablet 1  . ENSURE PLUS (ENSURE PLUS) LIQD Take 237 mLs by mouth 2 (two) times daily before a meal. 30 minutes before 2 meals (Patient not taking: Reported on 08/03/2015) 14220 mL 0    Musculoskeletal: Strength & Muscle Tone: within normal limits Gait & Station: normal Patient leans: N/A  Psychiatric Specialty Exam: Physical Exam  Psychiatric: Her speech is normal. Judgment normal. Her mood appears anxious. She is aggressive, actively hallucinating and combative. She exhibits abnormal recent memory.    Review of Systems  Constitutional: Negative.   HENT: Negative.   Eyes: Negative.   Respiratory: Negative.   Cardiovascular: Negative.   Gastrointestinal: Negative.   Genitourinary: Negative.   Musculoskeletal: Negative.   Skin: Negative.   Endo/Heme/Allergies: Negative.   Psychiatric/Behavioral: Positive for hallucinations. The patient is nervous/anxious.     Blood pressure 135/65, pulse 89, temperature 97.5 F (36.4 C), temperature source Oral, resp. rate 18, SpO2 100 %.  There is no weight on file to calculate BMI.  General Appearance: Casual  Eye Contact:  Good  Speech:  Clear and Coherent  Volume:  Normal  Mood:  Dysphoric  Affect:  Constricted  Thought Process:  Coherent  Orientation:  Other:  to person and place  Thought Content:  Logical  Suicidal Thoughts:  No  Homicidal Thoughts:  No  Memory:  Immediate;   Fair Recent;   Poor Remote;   Poor  Judgement:  Fair  Insight:  Fair  Psychomotor Activity:  Decreased  Concentration:   Concentration: Fair and Attention Span: Fair  Recall:  [employer] of Knowledge:  Fair  Language:  Good  Akathisia:  No  Handed:  Right  AIMS (if indicated):     Assets:  Communication Skills Social Support  ADL's:  Intact  Cognition:  Impaired,  Moderate  Sleep:   fair     Treatment Plan Summary: Daily contact with patient to assess and evaluate symptoms and progress in treatment. PLAN: -Crisis stabilization. -Start Celexa '10mg'$  daily for aggressive behavior. -Start Risperdal 0.'5mg'$  qhs for psychosis.  Disposition: No evidence of imminent risk to self or others at present.   Patient does not meet criteria for psychiatric inpatient admission. Will benefit from referral to a psychiatrist in the community upon discharge.  Corena Pilgrim, MD 08/04/2015 10:37 AM

## 2015-08-04 NOTE — Discharge Instructions (Addendum)
Dementia °Dementia is a word that is used to describe problems with the brain and how it works. People with dementia have memory loss. They may also have problems with thinking, speaking, or solving problems. It can affect how they act around people, how they do their job, their mood, and their personality. These changes may not show up for a long time. Family or friends may not notice problems in the early part of this disease. °HOME CARE °The following tips are for the person living with, or caring for, the person with dementia. °Make the home safe. °· Remove locks on bathroom doors. °· Use childproof locks on cabinets where alcohol, cleaning supplies, or chemicals are stored. °· Put outlet covers in electrical outlets. °· Put in childproof locks to keep doors and windows safe. °· Remove stove knobs, or put in safety knobs that shut off on their own. °· Lower the temperature on water heaters. °· Label medicines. Lock them in a safe place. °· Keep knives, lighters, matches, power tools, and guns out of reach or in a safe place. °· Remove objects that might break or can hurt the person. °· Make sure lighting is good inside and outside. °· Put in grab bars if needed. °· Use a device that detects falls or other needs for help. °Lessen confusion. °· Keep familiar objects and people around. °· Use night lights or low lit (dim) lights at night. °· Label objects or areas. °· Use reminders, notes, or directions for daily activities or tasks. °· Keep a simple routine that is the same for waking, meals, bathing, dressing, and bedtime. °· Create a calm and quiet home. °· Put up clocks and calendars. °· Keep emergency numbers and the home address near all phones. °· Help show the different times of day. Open the curtains during the day to let light in. °Speak clearly and directly. °· Choose simple words and short sentences. °· Use a gentle, calm voice. °· Do not interrupt. °· If the person has a hard time finding a word to  use, give them the word or thought. °· Ask 1 question at a time. Give enough time for the person to answer. Repeat the question if the person does not answer. °Do things that lessen restlessness. °· Provide a comfortable bed. °· Have the same bedtime routine every night. °· Have a regular walking and activity schedule. °· Lessen naps during the day. °· Do not let the person drink a lot of caffeine. °· Go to events that are not overwhelming. °Eat well and drink fluids. °· Lessen distractions during meal times and snacks. °· Avoid foods that are too hot or too cold. °· Watch how the person chews and swallows. This is to make sure they do not choke. °Other °· Keep all vision, hearing, dental, and medical visits with the doctor. °· Only give medicines as told by the doctor. °· Watch the person's driving ability. Do not let the person drive if he or she cannot drive safely. °· Use a program that helps find a person if they become missing. You may need to register with this program. °GET HELP RIGHT AWAY IF:  °· A fever of 102° F (38.9° C) develops. °· Confusion develops or gets worse. °· Sleepiness develops or gets worse. °· Staying awake is hard to do. °· New behavior problems start like mood swings, aggression, and seeing things that are not there. °· Problems with balance, speech, or falling develop. °· Problems swallowing develop. °· Any   problems of another sickness develop. MAKE SURE YOU:  Understand these instructions.  Will watch his or her condition.  Will get help right away if he or she is not doing well or gets worse.   This information is not intended to replace advice given to you by your health care provider. Make sure you discuss any questions you have with your health care provider.   Document Released: 02/02/2008 Document Revised: 05/14/2011 Document Reviewed: 07/17/2010 Elsevier Interactive Patient Education [employer].    For your ongoing behavioral health needs:  You are  advised to follow up with an outpatient psychiatrist.  Contact one of the following practices at your earliest opportunity to schedule an intake appointment:       [facility] [place], [place] [address]      [phone]       [facility]      [address]      [place], [place] [address]      [phone]

## 2015-08-04 NOTE — ED Notes (Signed)
This nurse called son. He states he will be here closer to 1900 to pick up pt.

## 2015-08-04 NOTE — ED Notes (Addendum)
Pt standing at doorway. Pt is anxious about being picked up. This nurse assured her that her son is supposed to be picking her up around 1800.

## 2015-08-04 NOTE — BH Assessment (Addendum)
Psychiatry team (Dr. Darleene Cleaver and Waylan Boga, DNP) evaluated patient this morning. It was recommended that patient is discharged home with son this am. Patient resides with her son [name] [phone]. He is also noted as having durable power of attorney. The psychiatry team requested that patient's son is contacted and notified of the discharge. The son will also be notified that patient's medications have been adjusted as well as provided with medications for agitation/hallucinations. Writer did in fact contact patient's son and left a voicemail.

## 2015-08-04 NOTE — ED Notes (Signed)
Pt given a Sprite on request.

## 2015-08-04 NOTE — ED Notes (Signed)
Pt given meal tray.

## 2015-08-04 NOTE — Progress Notes (Signed)
Patient's son was  given discharge instructions including prescriptions, medication samples, and f/u appointments. He states understanding. He states receipt of all of his mother's belongings.

## 2015-08-05 MED FILL — ?CITALOPRAM HBR 10 MG TABLE: 10 | 30 days supply | Qty: 30 | Fill #0

## 2015-08-05 MED FILL — risperiDONE 0.5 MG TABS: 0.5 | 30 days supply | Qty: 30 | Fill #0

## 2015-08-06 ENCOUNTER — Emergency Department (HOSPITAL_COMMUNITY)
Admission: EM | Admit: 2015-08-06 | Discharge: 2015-08-08 | Disposition: A | Discharge status: Other Acute Inpatient Hospital | Payer: Medicare Other | Attending: Emergency Medicine | Admitting: Emergency Medicine

## 2015-08-06 ENCOUNTER — Encounter (HOSPITAL_COMMUNITY): Payer: Self-pay | Admitting: Emergency Medicine

## 2015-08-06 DIAGNOSIS — F03918 Unspecified dementia, unspecified severity, with other behavioral disturbance: Secondary | ICD-10-CM | POA: Diagnosis present

## 2015-08-06 DIAGNOSIS — R443 Hallucinations, unspecified: Secondary | ICD-10-CM | POA: Insufficient documentation

## 2015-08-06 DIAGNOSIS — I10 Essential (primary) hypertension: Secondary | ICD-10-CM | POA: Diagnosis not present

## 2015-08-06 DIAGNOSIS — R4182 Altered mental status, unspecified: Secondary | ICD-10-CM | POA: Diagnosis not present

## 2015-08-06 DIAGNOSIS — Z049 Encounter for examination and observation for unspecified reason: Secondary | ICD-10-CM

## 2015-08-06 DIAGNOSIS — F0391 Unspecified dementia with behavioral disturbance: Secondary | ICD-10-CM | POA: Diagnosis present

## 2015-08-06 DIAGNOSIS — E785 Hyperlipidemia, unspecified: Secondary | ICD-10-CM | POA: Insufficient documentation

## 2015-08-06 DIAGNOSIS — Z0389 Encounter for observation for other suspected diseases and conditions ruled out: Secondary | ICD-10-CM | POA: Diagnosis not present

## 2015-08-06 LAB — COMPREHENSIVE METABOLIC PANEL
ALT: 18 U/L (ref 14–54)
AST: 28 U/L (ref 15–41)
Albumin: 4.9 g/dL (ref 3.5–5.0)
Alkaline Phosphatase: 69 U/L (ref 38–126)
Anion gap: 8 (ref 5–15)
BUN: 21 mg/dL — ABNORMAL HIGH (ref 6–20)
CHLORIDE: 105 mmol/L (ref 101–111)
CO2: 29 mmol/L (ref 22–32)
CREATININE: 1.29 mg/dL — AB (ref 0.44–1.00)
Calcium: 9.3 mg/dL (ref 8.9–10.3)
GFR, EST AFRICAN AMERICAN: 48 mL/min — AB (ref >60)
GFR, EST NON AFRICAN AMERICAN: 42 mL/min — AB (ref >60)
Glucose, Bld: 111 mg/dL — ABNORMAL HIGH (ref 65–99)
POTASSIUM: 3.5 mmol/L (ref 3.5–5.1)
Sodium: 142 mmol/L (ref 135–145)
Total Bilirubin: 0.6 mg/dL (ref 0.3–1.2)
Total Protein: 8 g/dL (ref 6.5–8.1)

## 2015-08-06 LAB — RAPID URINE DRUG SCREEN, HOSP PERFORMED
Amphetamines: NOT DETECTED
BARBITURATES: NOT DETECTED
Benzodiazepines: NOT DETECTED
Cocaine: NOT DETECTED
Opiates: NOT DETECTED
TETRAHYDROCANNABINOL: NOT DETECTED

## 2015-08-06 LAB — URINALYSIS, ROUTINE W REFLEX MICROSCOPIC
Glucose, UA: NEGATIVE mg/dL
Hgb urine dipstick: NEGATIVE
KETONES UR: NEGATIVE mg/dL
LEUKOCYTES UA: NEGATIVE
NITRITE: NEGATIVE
PROTEIN: NEGATIVE mg/dL
Specific Gravity, Urine: 1.029 (ref 1.005–1.030)
pH: 5.5 (ref 5.0–8.0)

## 2015-08-06 LAB — CBC WITH DIFFERENTIAL/PLATELET
Basophils Absolute: 0 10*3/uL (ref 0.0–0.1)
Basophils Relative: 0 %
EOS PCT: 0 %
Eosinophils Absolute: 0 10*3/uL (ref 0.0–0.7)
HCT: 42.3 % (ref 36.0–46.0)
Hemoglobin: 13.7 g/dL (ref 12.0–15.0)
LYMPHS ABS: 1.2 10*3/uL (ref 0.7–4.0)
LYMPHS PCT: 17 %
MCH: 29.2 pg (ref 26.0–34.0)
MCHC: 32.4 g/dL (ref 30.0–36.0)
MCV: 90.2 fL (ref 78.0–100.0)
MONO ABS: 0.6 10*3/uL (ref 0.1–1.0)
Monocytes Relative: 8 %
NEUTROS PCT: 75 %
Neutro Abs: 5.4 10*3/uL (ref 1.7–7.7)
Platelets: 469 10*3/uL — ABNORMAL HIGH (ref 150–400)
RBC: 4.69 MIL/uL (ref 3.87–5.11)
RDW: 13.4 % (ref 11.5–15.5)
WBC: 7.2 10*3/uL (ref 4.0–10.5)

## 2015-08-06 LAB — ETHANOL: Alcohol, Ethyl (B): <5 mg/dL (ref <5)

## 2015-08-06 MED ORDER — ACETAMINOPHEN 325 MG PO TABS: 650.0000 mg | ORAL_TABLET | ORAL | Status: DC | PRN

## 2015-08-06 MED ORDER — RISPERIDONE 0.5 MG PO TABS
0.5000 mg | ORAL_TABLET | Freq: Every day | ORAL | Status: DC
  Administered 2015-08-06: 0.5 mg via ORAL
  Filled 2015-08-06: qty 1

## 2015-08-06 MED ORDER — ALUM & MAG HYDROXIDE-SIMETH 200-200-20 MG/5ML PO SUSP: 30.0000 mL | ORAL | Status: DC | PRN

## 2015-08-06 MED ORDER — DONEPEZIL HCL 5 MG PO TABS
10.0000 mg | ORAL_TABLET | Freq: Every day | ORAL | Status: DC
  Administered 2015-08-06 – 2015-08-07 (×2): 10 mg via ORAL
  Filled 2015-08-06 (×2): qty 2

## 2015-08-06 MED ORDER — LORAZEPAM 1 MG PO TABS: 1.0000 mg | ORAL_TABLET | Freq: Three times a day (TID) | ORAL | Status: DC | PRN

## 2015-08-06 MED ORDER — ONDANSETRON HCL 4 MG PO TABS
4.0000 mg | ORAL_TABLET | Freq: Three times a day (TID) | ORAL | Status: DC | PRN
Start: 2015-08-06 — End: 2015-08-08

## 2015-08-06 MED ORDER — IBUPROFEN 200 MG PO TABS: 600.0000 mg | ORAL_TABLET | Freq: Three times a day (TID) | ORAL | Status: DC | PRN

## 2015-08-06 MED ORDER — SODIUM CHLORIDE 0.9 % IV BOLUS (SEPSIS)
1000.0000 mL | Freq: Once | INTRAVENOUS | Status: AC
  Administered 2015-08-06: 1000 mL via INTRAVENOUS

## 2015-08-06 MED ORDER — CITALOPRAM HYDROBROMIDE 10 MG PO TABS
10.0000 mg | ORAL_TABLET | Freq: Every day | ORAL | Status: DC
  Administered 2015-08-07: 10 mg via ORAL
  Filled 2015-08-06 (×2): qty 1

## 2015-08-06 MED ORDER — SIMVASTATIN 20 MG PO TABS
20.0000 mg | ORAL_TABLET | Freq: Every day | ORAL | Status: DC
  Administered 2015-08-06 – 2015-08-08 (×3): 20 mg via ORAL
  Filled 2015-08-06 (×4): qty 1

## 2015-08-06 NOTE — BHH Counselor (Signed)
Per Waylan Boga, NP recommend inpatient gero-psych care referral. Allena Napoleon. Nash Shearer, LPC-A, [facility]  Counselor 08/06/2015 6:14 PM

## 2015-08-06 NOTE — ED Provider Notes (Signed)
CSN: [id number]     Arrival date & time 08/06/15  1459 History   First MD Initiated Contact with Patient 08/06/15 1526     Chief Complaint  Patient presents with  . Hallucinations     (Consider location/radiation/quality/duration/timing/severity/associated sxs/prior Treatment) HPI Comments: Patient here with her son with increasing hallucinations. She denies any suicidal ideations. No homicidal ideations. Seen here 2 days ago for similar symptoms and was evaluated by psychiatry who felt that this was read baseline dementia. Her son states that she has had some cognitive deficits but that the behavioral issues are new which have manifested as emotional lability.She has been sleeping appropriately denies any sundowning. No recent fever, vomiting, diarrhea. She is not complaining of any pain except for a cyst on the left side of her face which is been in for quite some time. The psychiatrist she saw 2 days ago prescribed her medications which she has been compliant with.  The history is provided by the patient and a relative.    Past Medical History  Diagnosis Date  . Dementia Dx 2015  . Hyperlipidemia DX 2015  . Hypertension Dx 2015   Past Surgical History  Procedure Laterality Date  . Cesarean section  1975, 1978    Family History  Problem Relation Age of Onset  . Cancer Neg Hx   . Heart disease Neg Hx   . Dementia Neg Hx    Social History  Substance Use Topics  . Smoking status: Never Smoker   . Smokeless tobacco: Never Used  . Alcohol Use: No   OB History    No data available     Review of Systems  All other systems reviewed and are negative.     Allergies  Review of patient's allergies indicates no known allergies.  Home Medications   Prior to Admission medications   Medication Sig Start Date End Date Taking? Authorizing Provider  citalopram (CELEXA) 10 MG tablet Take 1 tablet (10 mg total) by mouth daily. 08/04/15  Yes Patrecia Pour, NP  donepezil (ARICEPT) 10  MG tablet TAKE 1 TABLET BY MOUTH AT BEDTIME. 04/22/15  Yes Josalyn Funches, MD  risperiDONE (RISPERDAL) 0.5 MG tablet Take 1 tablet (0.5 mg total) by mouth at bedtime. 08/04/15  Yes Patrecia Pour, NP  simvastatin (ZOCOR) 20 MG tablet TAKE 1 TABLET BY MOUTH DAILY. 04/22/15  Yes Josalyn Funches, MD  ENSURE PLUS (ENSURE PLUS) LIQD Take 237 mLs by mouth 2 (two) times daily before a meal. 30 minutes before 2 meals Patient not taking: Reported on 08/03/2015 03/11/14   Boykin Nearing, MD  LORazepam (ATIVAN) 0.5 MG tablet Take 1 tablet (0.5 mg total) by mouth 2 (two) times daily as needed for anxiety (agitation). 08/04/15   Patrecia Pour, NP   BP 125/66 mmHg  Pulse 89  Temp(Src) 99 F (37.2 C) (Oral)  Resp 16  SpO2 97% Physical Exam  Constitutional: She is oriented to person, place, and time. She appears well-developed and well-nourished.  Non-toxic appearance. No distress.  HENT:  Head: Normocephalic and atraumatic.  Eyes: Conjunctivae, EOM and lids are normal. Pupils are equal, round, and reactive to light.  Neck: Normal range of motion. Neck supple. No tracheal deviation present. No thyroid mass present.  Cardiovascular: Normal rate, regular rhythm and normal heart sounds.  Exam reveals no gallop.   No murmur heard. Pulmonary/Chest: Effort normal and breath sounds normal. No stridor. No respiratory distress. She has no decreased breath sounds. She has no wheezes. She  has no rhonchi. She has no rales.  Abdominal: Soft. Normal appearance and bowel sounds are normal. She exhibits no distension. There is no tenderness. There is no rebound and no CVA tenderness.  Musculoskeletal: Normal range of motion. She exhibits no edema or tenderness.  Neurological: She is alert and oriented to person, place, and time. She has normal strength. No cranial nerve deficit or sensory deficit. GCS eye subscore is 4. GCS verbal subscore is 5. GCS motor subscore is 6.  Skin: Skin is warm and dry. No abrasion and no rash noted.   Psychiatric: Her affect is blunt. Her speech is delayed. She is withdrawn.  Nursing note and vitals reviewed.   ED Course  Procedures (including critical care time) Labs Review Labs Reviewed  URINE CULTURE  CBC WITH DIFFERENTIAL/PLATELET  COMPREHENSIVE METABOLIC PANEL  ETHANOL  URINE RAPID DRUG SCREEN, HOSP PERFORMED  URINALYSIS, ROUTINE W REFLEX MICROSCOPIC (NOT AT [facility])    Imaging Review No results found. I have personally reviewed and evaluated these images and lab results as part of my medical decision-making.   EKG Interpretation None      MDM   Final diagnoses:  None    Patient medically cleared and disposition will be per psychiatry service    Lacretia Leigh, MD 08/06/15 2035

## 2015-08-06 NOTE — ED Notes (Signed)
Pt ate fruit cup only, did not eat full meal try.

## 2015-08-06 NOTE — ED Notes (Addendum)
Pts son brought the pt in stating she woke up this morning with hallucinating thoughts. Pt was seen 3 days ago for the same reason. Son is concerned it is getting worse. Denies SI/HI at present time, son states his mother sometimes has violent thoughts from "voices". Pt also has a lump on the left cheek she is concerned about.

## 2015-08-06 NOTE — BHH Counselor (Signed)
This counselor contacted son of pt. Deanna [name] [phone] regarding pt. Disposition. Son states that yes he does normally sign consent forms for pt. Care with durable power of attorney and agrees that she can sign but would prefer to be present once pt. Referrals are accepted for Gero-Psych placement.  Son is now aware that gero-psych referral is in place, and agrees. Son noted that hospital should contact him once we receive acceptance for placement. [name] K. Nash Shearer, LPC-A, [facility]  Counselor 08/06/2015 6:28 PM

## 2015-08-06 NOTE — BH Assessment (Signed)
Tele Assessment Note   Deanna [name] is an 70 y.o. female, African American who presents to [facility] ER for complaints of worsening dimentia symptoms and AVH with no commands. Patient has been seen recently at [facility] and was d/c / proscribed medications. Patient voice was very low yet audible. Patient states that she does have re-occuring problems with AVH and that the voices are with no command, but that the voices are of someone who is not there, and the VH are of some figures or persons who are not there. Patient states that she only sleeps up to 4 hours per night for past weeks. Patient does reside with son [name] who has durable power of attorney.  Patient denies current SI/ HI or plan and denies past history of. Patient acknowledges current AVH with no command voices. Patient denies HI or substance abuse history. Patient acknowledges hx of inpatient psychiatric care with [facility] at unknown time frame, where she was admitted and first diagnosed with dementia. Patient denies current or past outpatient psychiatric care.  Patient is dressed in scrubs and is alert and oriented x4. Patient speech was within normal limits[yet soft and slightly audible], and motor behavior appeared normal. Patient thought process is coherent. Patient does not appear to be responding to internal stimuli. Patient was cooperative throughout the assessment and states that she is agreeable to inpatient psychiatric treatment.      Diagnosis: Dimentia with behavioral disturbance  Past Medical History:  Past Medical History  Diagnosis Date  . Dementia Dx 2015  . Hyperlipidemia DX 2015  . Hypertension Dx 2015    Past Surgical History  Procedure Laterality Date  . Cesarean section  1975, 1978     Family History:  Family History  Problem Relation Age of Onset  . Cancer Neg Hx   . Heart disease Neg Hx   . Dementia Neg Hx     Social History:  reports that she has never smoked. She has never used  smokeless tobacco. She reports that she does not drink alcohol or use illicit drugs.  Additional Social History:  Alcohol / Drug Use Pain Medications: SEE MAR Prescriptions: SEE MAR Over the Counter: SEE MAR History of alcohol / drug use?: No history of alcohol / drug abuse Longest period of sobriety (when/how long): N/A  CIWA: CIWA-Ar BP: 145/87 mmHg Pulse Rate: 97 COWS:    PATIENT STRENGTHS: (choose at least two) Active sense of humor Average or above average intelligence  Allergies: No Known Allergies  Home Medications:  (Not in a hospital admission)  OB/GYN Status:  No LMP recorded. Patient is postmenopausal.  General Assessment Data Location of Assessment: [facility] ED TTS Assessment: In system Is this a Tele or Face-to-Face Assessment?: Face-to-Face Is this an Initial Assessment or a Re-assessment for this encounter?: Initial Assessment Marital status: Single Is patient pregnant?: No Pregnancy Status: No Living Arrangements: Children Can pt return to current living arrangement?: Yes Admission Status: Voluntary Is patient capable of signing voluntary admission?: Yes Referral Source: Self/Family/Friend Insurance type: Medicare     [place] Living Arrangements: Children Name of Psychiatrist: n Name of Therapist: n  Education Status Is patient currently in school?: No Current Grade: na Highest grade of school patient has completed: na Name of school: na Contact person: [place] to self with the past 6 months Suicidal Ideation: No-Not Currently/Within Last 6 Months Has patient been a risk to self within the past 6 months prior to admission? : No  Suicidal Intent: No Has patient had any suicidal intent within the past 6 months prior to admission? : No Is patient at risk for suicide?: No Suicidal Plan?: No-Not Currently/Within Last 6 Months Has patient had any suicidal plan within the past 6 months prior to admission? : No Access to Means:  No What has been your use of drugs/alcohol within the last 12 months?: na Previous Attempts/Gestures: No How many times?: 0 Other Self Harm Risks: n/a Triggers for Past Attempts: None known Intentional Self Injurious Behavior: None Family Suicide History: No Recent stressful life event(s): Other (Comment) Persecutory voices/beliefs?: No Depression: No Substance abuse history and/or treatment for substance abuse?: No Suicide prevention information given to non-admitted patients: Not applicable  Risk to Others within the past 6 months Homicidal Ideation: No Does patient have any lifetime risk of violence toward others beyond the six months prior to admission? : No Thoughts of Harm to Others: No-Not Currently Present/Within Last 6 Months Current Homicidal Intent: No Current Homicidal Plan: No Access to Homicidal Means: No Identified Victim: n History of harm to others?: No Assessment of Violence: None Noted Violent Behavior Description: na Does patient have access to weapons?: No Criminal Charges Pending?: No Does patient have a court date: No Is patient on probation?: No  Psychosis Hallucinations: Auditory Delusions: None noted  Mental Status Report Appearance/Hygiene: In scrubs Eye Contact: Fair Motor Activity: Unremarkable Speech: Soft, Slow Level of Consciousness: Alert Mood: Ashamed/humiliated Affect: Appropriate to circumstance Anxiety Level: Moderate Thought Processes: Coherent, Relevant Judgement: Partial Orientation: Person, Place, Time, Situation, Appropriate for developmental age Obsessive Compulsive Thoughts/Behaviors: None  Cognitive Functioning Concentration: Decreased Memory: Recent Intact, Remote Intact IQ: Average Insight: Fair Impulse Control: Fair Appetite: Good Weight Loss: 0 Weight Gain: 0 Sleep: No Change Total Hours of Sleep: 4 Vegetative Symptoms: None  ADLScreening [facility] Assessment Services) Patient's cognitive ability adequate to  safely complete daily activities?: Yes Patient able to express need for assistance with ADLs?: Yes Independently performs ADLs?: Yes (appropriate for developmental age)  Prior Inpatient Therapy Prior Inpatient Therapy: Yes Prior Therapy Dates: unknown Prior Therapy Facilty/Provider(s): [facility] Reason for Treatment: d  Prior Outpatient Therapy Prior Outpatient Therapy: No Prior Therapy Dates: n Prior Therapy Facilty/Provider(s): n Reason for Treatment: n Does patient have an ACCT team?: No Does patient have Intensive In-House Services?  : No Does patient have [facility] services? : No Does patient have P4CC services?: No  ADL Screening (condition at time of admission) Patient's cognitive ability adequate to safely complete daily activities?: Yes Is the patient deaf or have difficulty hearing?: No Does the patient have difficulty seeing, even when wearing glasses/contacts?: No Does the patient have difficulty concentrating, remembering, or making decisions?: No Patient able to express need for assistance with ADLs?: Yes Does the patient have difficulty dressing or bathing?: No Independently performs ADLs?: Yes (appropriate for developmental age) Does the patient have difficulty walking or climbing stairs?: No Weakness of Legs: None Weakness of Arms/Hands: None  Home Assistive Devices/Equipment Home Assistive Devices/Equipment: None    Abuse/Neglect Assessment (Assessment to be complete while patient is alone) Physical Abuse: Denies Verbal Abuse: Denies Sexual Abuse: Denies Exploitation of patient/patient's resources: Denies Self-Neglect: Denies Values / Beliefs Cultural Requests During Hospitalization: None Spiritual Requests During Hospitalization: None   Advance Directives (For Healthcare) Does patient have an advance directive?: Yes Would patient like information on creating an advanced directive?: No - patient declined information Type of Advance Directive:  (pt  son has durable power of attorney) Does patient  want to make changes to advanced directive?: No - Patient declined Copy of advanced directive(s) in chart?: No - copy requested    Additional Information 1:1 In Past 12 Months?: No CIRT Risk: No Elopement Risk: No Does patient have medical clearance?: No     Disposition:  Per Waylan Boga, NP meets criteria for inpatient gero-psych placement care. Disposition Initial Assessment Completed for this Encounter: Yes Disposition of Patient: Other dispositions Other disposition(s): Other (Comment) (tbd upon consult with extender)  Kristeen Mans 08/06/2015 6:02 PM

## 2015-08-07 DIAGNOSIS — F0391 Unspecified dementia with behavioral disturbance: Secondary | ICD-10-CM

## 2015-08-07 LAB — URINE CULTURE

## 2015-08-07 MED ORDER — HYDROXYZINE HCL 10 MG PO TABS
10.0000 mg | ORAL_TABLET | Freq: Two times a day (BID) | ORAL | Status: DC
Start: 2015-08-07 — End: 2015-08-08
  Administered 2015-08-07 – 2015-08-08 (×3): 10 mg via ORAL
  Filled 2015-08-07 (×5): qty 1

## 2015-08-07 MED ORDER — HALOPERIDOL 1 MG PO TABS
0.5000 mg | ORAL_TABLET | Freq: Two times a day (BID) | ORAL | Status: DC
  Administered 2015-08-07 – 2015-08-08 (×3): 0.5 mg via ORAL
  Filled 2015-08-07 (×3): qty 1

## 2015-08-07 MED ORDER — CITALOPRAM HYDROBROMIDE 20 MG PO TABS
20.0000 mg | ORAL_TABLET | Freq: Every day | ORAL | Status: DC
  Administered 2015-08-08: 20 mg via ORAL
  Filled 2015-08-07: qty 1

## 2015-08-07 NOTE — Progress Notes (Addendum)
Disposition CSW completed patient referrals to the following inpatient Geropsychiatry facilities:   [facility]  CSW will continue to follow patient for placement needs.  [place] Disposition CSW [phone]

## 2015-08-07 NOTE — Consult Note (Signed)
[place] Psychiatry Consult   Reason for Consult: psychosis, aggressive behavior Referring Physician:  EDP Patient Identification: Deanna [name] MRN:  [id number] Principal Diagnosis: Dementia with behavioral disturbance Diagnosis:   Patient Active Problem List   Diagnosis Date Noted  . Dementia with behavioral disturbance [F03.91] 08/04/2015    Priority: High  . Increased ammonia level [R79.89] 06/15/2014  . Left flank pain [R10.9] 06/15/2014  . Cognitive changes [R41.89] 03/24/2014  . Fatigue [R53.83] 03/24/2014  . Vitreous floaters of right eye [H43.391] 03/11/2014  . Underweight [R63.6] 03/11/2014  . HTN (hypertension) [I10] 12/16/2013  . HLD (hyperlipidemia) [E78.5] 12/16/2013  . Dementia [F03.90] 12/16/2013    Total Time spent with patient: 45 minutes  Subjective:   [name] is a 70 y.o. female patient admitted with hallucinations.  HPI: Patient with history of Dementia who was brought to the ED yesterday by her due to increased psychosis, emotional lability and aggressive behavior. Patient was evaluated and treated for same at [facility] ED last week, he was discharged after few days. Patient was observed talking to herself and acting bizarre. She is a poor historian and her son reports that her behavior is worse in the evenings when se become combative and aggressive. Patient denies drugs and alcohol use.  Past Psychiatric History: Dementia with behavior disturbance.  Risk to Self: Is patient at risk for suicide?: No Risk to Others:   Prior Inpatient Therapy:   Prior Outpatient Therapy:    Past Medical History:  Past Medical History  Diagnosis Date  . Dementia Dx 2015  . Hyperlipidemia DX 2015  . Hypertension Dx 2015    Past Surgical History  Procedure Laterality Date  . Cesarean section  1975, 1978    Family History:  Family History  Problem Relation Age of Onset  . Cancer Neg Hx   . Heart disease Neg Hx   . Dementia Neg Hx    Family  Psychiatric  History:  Social History:  History  Alcohol Use No     History  Drug Use No    Social History   Social History  . Marital Status: Widowed    Spouse Name: N/A  . Number of Children: 2   . Years of Education: GED    Occupational History  . 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 Smoking status: Never Smoker   . Smokeless tobacco: Never Used  . Alcohol Use: No  . Drug Use: No  . Sexual Activity: No   Other Topics Concern  . None   Social History Narrative   From [place].    Lives with son Deanna [name] moved from [place] to [place] in 11/2013.    Patient is right handed    Patient has 2 children    Patient has some college education    Patient is retired       Additional Social History:    Allergies:  No Known Allergies  Labs:  Results for orders placed or performed during the hospital encounter of 08/06/15 (from the past 48 hour(s))  Urine rapid drug screen (hosp performed)     Status: None   Collection Time: 08/06/15  3:31 PM  Result Value Ref Range   Opiates NONE DETECTED NONE DETECTED   Cocaine NONE DETECTED NONE DETECTED   Benzodiazepines NONE DETECTED NONE DETECTED   Amphetamines NONE DETECTED NONE DETECTED   Tetrahydrocannabinol NONE DETECTED NONE  DETECTED   Barbiturates NONE DETECTED NONE DETECTED    Comment:        DRUG SCREEN FOR MEDICAL PURPOSES ONLY.  IF CONFIRMATION IS NEEDED FOR ANY PURPOSE, NOTIFY LAB WITHIN 5 DAYS.        LOWEST DETECTABLE LIMITS FOR URINE DRUG SCREEN Drug Class       Cutoff (ng/mL) Amphetamine      1000 Barbiturate      200 Benzodiazepine   200 Tricyclics       300 Opiates          300 Cocaine          300 THC              50   Urinalysis, Routine w reflex microscopic (not at [facility])     Status: Abnormal   Collection Time: 08/06/15  3:32 PM  Result Value Ref Range   Color, Urine AMBER (A) YELLOW    Comment: BIOCHEMICALS MAY BE AFFECTED BY  COLOR   APPearance CLOUDY (A) CLEAR   Specific Gravity, Urine 1.029 1.005 - 1.030   pH 5.5 5.0 - 8.0   Glucose, UA NEGATIVE NEGATIVE mg/dL   Hgb urine dipstick NEGATIVE NEGATIVE   Bilirubin Urine SMALL (A) NEGATIVE   Ketones, ur NEGATIVE NEGATIVE mg/dL   Protein, ur NEGATIVE NEGATIVE mg/dL   Nitrite NEGATIVE NEGATIVE   Leukocytes, UA NEGATIVE NEGATIVE    Comment: MICROSCOPIC NOT DONE ON URINES WITH NEGATIVE PROTEIN, BLOOD, LEUKOCYTES, NITRITE, OR GLUCOSE <1000 mg/dL.  CBC with Differential/Platelet     Status: Abnormal   Collection Time: 08/06/15  3:59 PM  Result Value Ref Range   WBC 7.2 4.0 - 10.5 K/uL   RBC 4.69 3.87 - 5.11 MIL/uL   Hemoglobin 13.7 12.0 - 15.0 g/dL   HCT 75.3 16.7 - 99.3 %   MCV 90.2 78.0 - 100.0 fL   MCH 29.2 26.0 - 34.0 pg   MCHC 32.4 30.0 - 36.0 g/dL   RDW 27.3 66.1 - 97.2 %   Platelets 469 (H) 150 - 400 K/uL   Neutrophils Relative % 75 %   Neutro Abs 5.4 1.7 - 7.7 K/uL   Lymphocytes Relative 17 %   Lymphs Abs 1.2 0.7 - 4.0 K/uL   Monocytes Relative 8 %   Monocytes Absolute 0.6 0.1 - 1.0 K/uL   Eosinophils Relative 0 %   Eosinophils Absolute 0.0 0.0 - 0.7 K/uL   Basophils Relative 0 %   Basophils Absolute 0.0 0.0 - 0.1 K/uL  Comprehensive metabolic panel     Status: Abnormal   Collection Time: 08/06/15  3:59 PM  Result Value Ref Range   Sodium 142 135 - 145 mmol/L   Potassium 3.5 3.5 - 5.1 mmol/L   Chloride 105 101 - 111 mmol/L   CO2 29 22 - 32 mmol/L   Glucose, Bld 111 (H) 65 - 99 mg/dL   BUN 21 (H) 6 - 20 mg/dL   Creatinine, Ser 4.10 (H) 0.44 - 1.00 mg/dL   Calcium 9.3 8.9 - 01.5 mg/dL   Total Protein 8.0 6.5 - 8.1 g/dL   Albumin 4.9 3.5 - 5.0 g/dL   AST 28 15 - 41 U/L   ALT 18 14 - 54 U/L   Alkaline Phosphatase 69 38 - 126 U/L   Total Bilirubin 0.6 0.3 - 1.2 mg/dL   GFR calc non Af Amer 42 (L) >60 mL/min   GFR calc Af Amer 48 (L) >60 mL/min    Comment: (NOTE)  The eGFR has been calculated using the CKD EPI equation. This calculation has  not been validated in all clinical situations. eGFR's persistently <60 mL/min signify possible Chronic Kidney Disease.    Anion gap 8 5 - 15  Ethanol     Status: None   Collection Time: 08/06/15  3:59 PM  Result Value Ref Range   Alcohol, Ethyl (B) <5 <5 mg/dL    Comment:        LOWEST DETECTABLE LIMIT FOR SERUM ALCOHOL IS 5 mg/dL FOR MEDICAL PURPOSES ONLY     Current Facility-Administered Medications  Medication Dose Route Frequency Provider Last Rate Last Dose  . acetaminophen (TYLENOL) tablet 650 mg  650 mg Oral Q4H PRN Lacretia Leigh, MD      . alum & mag hydroxide-simeth (MAALOX/MYLANTA) 200-200-20 MG/5ML suspension 30 mL  30 mL Oral PRN Lacretia Leigh, MD      . Derrill Memo ON 08/08/2015] citalopram (CELEXA) tablet 20 mg  20 mg Oral Daily Keyler Hoge, MD      . donepezil (ARICEPT) tablet 10 mg  10 mg Oral QHS Lacretia Leigh, MD   10 mg at 08/06/15 2256  . haloperidol (HALDOL) tablet 0.5 mg  0.5 mg Oral BID Corena Pilgrim, MD      . hydrOXYzine (ATARAX/VISTARIL) tablet 10 mg  10 mg Oral BID PC Deziah Renwick, MD      . ibuprofen (ADVIL,MOTRIN) tablet 600 mg  600 mg Oral Q8H PRN Lacretia Leigh, MD      . LORazepam (ATIVAN) tablet 1 mg  1 mg Oral Q8H PRN Lacretia Leigh, MD      . ondansetron [facility]) tablet 4 mg  4 mg Oral Q8H PRN Lacretia Leigh, MD      . simvastatin (ZOCOR) tablet 20 mg  20 mg Oral Daily Lacretia Leigh, MD   20 mg at 08/07/15 1034   Current Outpatient Prescriptions  Medication Sig Dispense Refill  . citalopram (CELEXA) 10 MG tablet Take 1 tablet (10 mg total) by mouth daily. 30 tablet 0  . donepezil (ARICEPT) 10 MG tablet TAKE 1 TABLET BY MOUTH AT BEDTIME. 90 tablet 1  . risperiDONE (RISPERDAL) 0.5 MG tablet Take 1 tablet (0.5 mg total) by mouth at bedtime. 30 tablet 0  . simvastatin (ZOCOR) 20 MG tablet TAKE 1 TABLET BY MOUTH DAILY. 90 tablet 1  . ENSURE PLUS (ENSURE PLUS) LIQD Take 237 mLs by mouth 2 (two) times daily before a meal. 30 minutes before 2 meals  (Patient not taking: Reported on 08/03/2015) 14220 mL 0  . LORazepam (ATIVAN) 0.5 MG tablet Take 1 tablet (0.5 mg total) by mouth 2 (two) times daily as needed for anxiety (agitation). 30 tablet 0    Musculoskeletal: Strength & Muscle Tone: within normal limits Gait & Station: normal Patient leans: N/A  Psychiatric Specialty Exam: Physical Exam  Psychiatric: Her speech is normal. Judgment and thought content normal. Her mood appears anxious. She is agitated, aggressive, actively hallucinating and combative. Cognition and memory are normal.    Review of Systems  Constitutional: Negative.   HENT: Negative.   Eyes: Negative.   Respiratory: Negative.   Cardiovascular: Negative.   Gastrointestinal: Negative.   Genitourinary: Negative.   Musculoskeletal: Negative.   Skin: Negative.   Neurological: Negative.   Endo/Heme/Allergies: Negative.   Psychiatric/Behavioral: Positive for hallucinations. The patient is nervous/anxious.     Blood pressure 145/70, pulse 82, temperature 99 F (37.2 C), temperature source Oral, resp. rate 16, SpO2 100 %.There is no weight on file  to calculate BMI.  General Appearance: Casual  Eye Contact:  Minimal  Speech:  Clear and Coherent  Volume:  Decreased  Mood:  Anxious and Irritable  Affect:  Constricted  Thought Process:  Disorganized and Descriptions of Associations: Tangential  Orientation:  Other:  place and person  Thought Content:  Delusions and Hallucinations: Visual  Suicidal Thoughts:  No  Homicidal Thoughts:  No  Memory:  Immediate;   Fair Recent;   Poor Remote;   Poor  Judgement:  Impaired  Insight:  Lacking  Psychomotor Activity:  Restlessness  Concentration:  Concentration: Fair and Attention Span: Fair  Recall:  [employer] of Knowledge:  Fair  Language:  Good  Akathisia:  No  Handed:  Right  AIMS (if indicated):     Assets:  Communication Skills Social Support  ADL's:  Impaired  Cognition:  Impaired,  Moderate  Sleep:   poor      Treatment Plan Summary: Daily contact with patient to assess and evaluate symptoms and progress in treatment. PLAN: -Crisis stabilization -Discontinue Risperdal. -Increase Celexa to 79m daily for aggression -Start Haldol 0.519mBID for psychosis. -Start Hydroxyzine 1015mid for anxiety/agitation  Disposition: Recommend psychiatric Inpatient admission when medically cleared. Supportive therapy provided about ongoing stressors. Patient will be referred to a Geriatric inpatient facility  AkiCorena PilgrimD 08/07/2015 10:43 AM

## 2015-08-07 NOTE — ED Notes (Signed)
Received call from [facility] can not accept patient they are at capacity.

## 2015-08-08 ENCOUNTER — Other Ambulatory Visit (HOSPITAL_COMMUNITY): Payer: Self-pay

## 2015-08-08 ENCOUNTER — Other Ambulatory Visit: Payer: Self-pay

## 2015-08-08 ENCOUNTER — Emergency Department (HOSPITAL_COMMUNITY): Payer: Medicare Other

## 2015-08-08 DIAGNOSIS — E559 Vitamin D deficiency, unspecified: Secondary | ICD-10-CM | POA: Diagnosis present

## 2015-08-08 DIAGNOSIS — Z79899 Other long term (current) drug therapy: Secondary | ICD-10-CM | POA: Diagnosis not present

## 2015-08-08 DIAGNOSIS — E782 Mixed hyperlipidemia: Secondary | ICD-10-CM | POA: Diagnosis not present

## 2015-08-08 DIAGNOSIS — E46 Unspecified protein-calorie malnutrition: Secondary | ICD-10-CM | POA: Diagnosis not present

## 2015-08-08 DIAGNOSIS — F0390 Unspecified dementia without behavioral disturbance: Secondary | ICD-10-CM | POA: Diagnosis not present

## 2015-08-08 DIAGNOSIS — K5901 Slow transit constipation: Secondary | ICD-10-CM | POA: Diagnosis not present

## 2015-08-08 DIAGNOSIS — E876 Hypokalemia: Secondary | ICD-10-CM | POA: Diagnosis not present

## 2015-08-08 DIAGNOSIS — F0391 Unspecified dementia with behavioral disturbance: Secondary | ICD-10-CM | POA: Diagnosis not present

## 2015-08-08 DIAGNOSIS — R110 Nausea: Secondary | ICD-10-CM | POA: Diagnosis not present

## 2015-08-08 DIAGNOSIS — K117 Disturbances of salivary secretion: Secondary | ICD-10-CM | POA: Diagnosis not present

## 2015-08-08 DIAGNOSIS — R441 Visual hallucinations: Secondary | ICD-10-CM | POA: Diagnosis present

## 2015-08-08 DIAGNOSIS — R443 Hallucinations, unspecified: Secondary | ICD-10-CM | POA: Diagnosis not present

## 2015-08-08 DIAGNOSIS — I4581 Long QT syndrome: Secondary | ICD-10-CM | POA: Diagnosis present

## 2015-08-08 DIAGNOSIS — Z681 Body mass index (BMI) 19 or less, adult: Secondary | ICD-10-CM | POA: Diagnosis not present

## 2015-08-08 DIAGNOSIS — E785 Hyperlipidemia, unspecified: Secondary | ICD-10-CM | POA: Diagnosis present

## 2015-08-08 DIAGNOSIS — R7303 Prediabetes: Secondary | ICD-10-CM | POA: Diagnosis not present

## 2015-08-08 DIAGNOSIS — R4182 Altered mental status, unspecified: Secondary | ICD-10-CM | POA: Diagnosis not present

## 2015-08-08 DIAGNOSIS — F17200 Nicotine dependence, unspecified, uncomplicated: Secondary | ICD-10-CM | POA: Diagnosis not present

## 2015-08-08 DIAGNOSIS — R636 Underweight: Secondary | ICD-10-CM | POA: Diagnosis not present

## 2015-08-08 DIAGNOSIS — E440 Moderate protein-calorie malnutrition: Secondary | ICD-10-CM | POA: Diagnosis present

## 2015-08-08 DIAGNOSIS — I10 Essential (primary) hypertension: Secondary | ICD-10-CM | POA: Diagnosis present

## 2015-08-08 DIAGNOSIS — R9431 Abnormal electrocardiogram [ECG] [EKG]: Secondary | ICD-10-CM | POA: Diagnosis not present

## 2015-08-08 NOTE — BH Assessment (Signed)
[facility] Assessment Progress Note  Per Corena Pilgrim, MD, this pt requires psychiatric hospitalization at this time.  Waylan Boga, DNP, reports that this pt meets criteria for IVC; EDP Brantley Stage, MD concurs with this opinion and has initiated IVC.  Documents have been faxed to [facility], and at 15:50 Lattie Corns confirms receipt.  As of this writing, service of Findings and Custody Order is pending.  The following facilities have been contacted to seek placement for this pt, with results as noted:  Beds available, information sent, decision pending:  [place]:  [facility] (due to medical acuity)   At capacity:  [facility] [place] Triage Specialist [phone]

## 2015-08-08 NOTE — Progress Notes (Signed)
This ED CM spoke with pt on 08/03/15 Pt states pcp remains [facility] although has not seen in a while  Entered in d/c instructions [facility] on 08/17/2015 You have a scheduled 08/17/15 appt 0900 with Dr Anice Paganini [address] [place] [phone]

## 2015-08-08 NOTE — ED Notes (Signed)
Report called to [place] at [place], [phone]. Pt calm and gave no meaningful answers to assessment questions. All belongings sent over with sheriff.

## 2015-08-08 NOTE — Progress Notes (Signed)
Deanna [name] of [facility] reached out to CSW. She states that pt has been accepted to facility and is welcomed to come after 8:00.  CSW made nurse aware.  Accepting Physician: Dr. Lona Millard  Nurse Report Number: [phone]  Willette Brace [id number] ED CSW 08/08/2015 6:09 PM

## 2015-08-08 NOTE — Consult Note (Signed)
[facility] Consult   Reason for Consult: psychosis, aggressive behavior Referring Physician:  EDP Patient Identification: Deanna [name] MRN:  [id number] Principal Diagnosis: Dementia with behavioral disturbance Diagnosis:   Patient Active Problem List   Diagnosis Date Noted  . Dementia with behavioral disturbance [F03.91] 08/04/2015    Priority: High  . Increased ammonia level [R79.89] 06/15/2014  . Left flank pain [R10.9] 06/15/2014  . Cognitive changes [R41.89] 03/24/2014  . Fatigue [R53.83] 03/24/2014  . Vitreous floaters of right eye [H43.391] 03/11/2014  . Underweight [R63.6] 03/11/2014  . HTN (hypertension) [I10] 12/16/2013  . HLD (hyperlipidemia) [E78.5] 12/16/2013  . Dementia [F03.90] 12/16/2013    Total Time spent with patient: 30 minutes  Subjective:   Deanna [name] is a 70 y.o. female patient admitted with hallucinations.  HPI: Patient with history of Dementia who was brought to the ED yesterday by her due to increased psychosis, emotional lability and aggressive behavior. Patient was evaluated and treated for same at [facility] ED last week, he was discharged after few days. Patient was observed talking to herself and acting bizarre. She is a poor historian and her son reports that her behavior is worse in the evenings when se become combative and aggressive. Patient denies drugs and alcohol use.  Today:  Patient continues to wander, frequent redirection needed.    Past Psychiatric History: Dementia with behavior disturbance.  Risk to Self: Is patient at risk for suicide?: No Risk to Others:  none Prior Inpatient Therapy:  none Prior Outpatient Therapy:  none  Past Medical History:  Past Medical History  Diagnosis Date  . Dementia Dx 2015  . Hyperlipidemia DX 2015  . Hypertension Dx 2015    Past Surgical History  Procedure Laterality Date  . Cesarean section  1975, 1978    Family History:  Family History  Problem Relation Age of Onset   . Cancer Neg Hx   . Heart disease Neg Hx   . Dementia Neg Hx    Family Psychiatric  History:  Social History:  History  Alcohol Use No     History  Drug Use No    Social History   Social History  . Marital Status: Widowed    Spouse Name: N/A  . Number of Children: 2   . Years of Education: GED    Occupational History  . 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 Smoking status: Never Smoker   . Smokeless tobacco: Never Used  . Alcohol Use: No  . Drug Use: No  . Sexual Activity: No   Other Topics Concern  . None   Social History Narrative   From [place].    Lives with son [name] moved from [place] to [place] in 11/2013.    Patient is right handed    Patient has 2 children    Patient has some college education    Patient is retired       Additional Social History:    Allergies:  No Known Allergies  Labs:  Results for orders placed or performed during the hospital encounter of 08/06/15 (from the past 48 hour(s))  Urine rapid drug screen (hosp performed)     Status: None   Collection Time: 08/06/15  3:31 PM  Result Value Ref Range   Opiates NONE DETECTED NONE DETECTED   Cocaine NONE DETECTED NONE DETECTED   Benzodiazepines NONE DETECTED NONE DETECTED  Amphetamines NONE DETECTED NONE DETECTED   Tetrahydrocannabinol NONE DETECTED NONE DETECTED   Barbiturates NONE DETECTED NONE DETECTED    Comment:        DRUG SCREEN FOR MEDICAL PURPOSES ONLY.  IF CONFIRMATION IS NEEDED FOR ANY PURPOSE, NOTIFY LAB WITHIN 5 DAYS.        LOWEST DETECTABLE LIMITS FOR URINE DRUG SCREEN Drug Class       Cutoff (ng/mL) Amphetamine      1000 Barbiturate      200 Benzodiazepine   540 Tricyclics       086 Opiates          300 Cocaine          300 THC              50   Urine culture     Status: Abnormal   Collection Time: 08/06/15  3:31 PM  Result Value Ref Range   Specimen Description URINE, CLEAN CATCH     Special Requests NONE    Culture MULTIPLE SPECIES PRESENT, SUGGEST RECOLLECTION (A)    Report Status 08/07/2015 FINAL   Urinalysis, Routine w reflex microscopic (not at [facility])     Status: Abnormal   Collection Time: 08/06/15  3:32 PM  Result Value Ref Range   Color, Urine AMBER (A) YELLOW    Comment: BIOCHEMICALS MAY BE AFFECTED BY COLOR   APPearance CLOUDY (A) CLEAR   Specific Gravity, Urine 1.029 1.005 - 1.030   pH 5.5 5.0 - 8.0   Glucose, UA NEGATIVE NEGATIVE mg/dL   Hgb urine dipstick NEGATIVE NEGATIVE   Bilirubin Urine SMALL (A) NEGATIVE   Ketones, ur NEGATIVE NEGATIVE mg/dL   Protein, ur NEGATIVE NEGATIVE mg/dL   Nitrite NEGATIVE NEGATIVE   Leukocytes, UA NEGATIVE NEGATIVE    Comment: MICROSCOPIC NOT DONE ON URINES WITH NEGATIVE PROTEIN, BLOOD, LEUKOCYTES, NITRITE, OR GLUCOSE <1000 mg/dL.  CBC with Differential/Platelet     Status: Abnormal   Collection Time: 08/06/15  3:59 PM  Result Value Ref Range   WBC 7.2 4.0 - 10.5 K/uL   RBC 4.69 3.87 - 5.11 MIL/uL   Hemoglobin 13.7 12.0 - 15.0 g/dL   HCT 42.3 36.0 - 46.0 %   MCV 90.2 78.0 - 100.0 fL   MCH 29.2 26.0 - 34.0 pg   MCHC 32.4 30.0 - 36.0 g/dL   RDW 13.4 11.5 - 15.5 %   Platelets 469 (H) 150 - 400 K/uL   Neutrophils Relative % 75 %   Neutro Abs 5.4 1.7 - 7.7 K/uL   Lymphocytes Relative 17 %   Lymphs Abs 1.2 0.7 - 4.0 K/uL   Monocytes Relative 8 %   Monocytes Absolute 0.6 0.1 - 1.0 K/uL   Eosinophils Relative 0 %   Eosinophils Absolute 0.0 0.0 - 0.7 K/uL   Basophils Relative 0 %   Basophils Absolute 0.0 0.0 - 0.1 K/uL  Comprehensive metabolic panel     Status: Abnormal   Collection Time: 08/06/15  3:59 PM  Result Value Ref Range   Sodium 142 135 - 145 mmol/L   Potassium 3.5 3.5 - 5.1 mmol/L   Chloride 105 101 - 111 mmol/L   CO2 29 22 - 32 mmol/L   Glucose, Bld 111 (H) 65 - 99 mg/dL   BUN 21 (H) 6 - 20 mg/dL   Creatinine, Ser 1.29 (H) 0.44 - 1.00 mg/dL   Calcium 9.3 8.9 - 10.3 mg/dL   Total Protein 8.0 6.5 -  8.1 g/dL   Albumin  4.9 3.5 - 5.0 g/dL   AST 28 15 - 41 U/L   ALT 18 14 - 54 U/L   Alkaline Phosphatase 69 38 - 126 U/L   Total Bilirubin 0.6 0.3 - 1.2 mg/dL   GFR calc non Af Amer 42 (L) >60 mL/min   GFR calc Af Amer 48 (L) >60 mL/min    Comment: (NOTE) The eGFR has been calculated using the CKD EPI equation. This calculation has not been validated in all clinical situations. eGFR's persistently <60 mL/min signify possible Chronic Kidney Disease.    Anion gap 8 5 - 15  Ethanol     Status: None   Collection Time: 08/06/15  3:59 PM  Result Value Ref Range   Alcohol, Ethyl (B) <5 <5 mg/dL    Comment:        LOWEST DETECTABLE LIMIT FOR SERUM ALCOHOL IS 5 mg/dL FOR MEDICAL PURPOSES ONLY     Current Facility-Administered Medications  Medication Dose Route Frequency Provider Last Rate Last Dose  . acetaminophen (TYLENOL) tablet 650 mg  650 mg Oral Q4H PRN Lacretia Leigh, MD      . alum & mag hydroxide-simeth (MAALOX/MYLANTA) 200-200-20 MG/5ML suspension 30 mL  30 mL Oral PRN Lacretia Leigh, MD      . citalopram (CELEXA) tablet 20 mg  20 mg Oral Daily Corena Pilgrim, MD   20 mg at 08/08/15 0835  . donepezil (ARICEPT) tablet 10 mg  10 mg Oral QHS Lacretia Leigh, MD   10 mg at 08/07/15 2131  . haloperidol (HALDOL) tablet 0.5 mg  0.5 mg Oral BID Corena Pilgrim, MD   0.5 mg at 08/08/15 0836  . hydrOXYzine (ATARAX/VISTARIL) tablet 10 mg  10 mg Oral BID PC Corena Pilgrim, MD   10 mg at 08/08/15 0835  . ibuprofen (ADVIL,MOTRIN) tablet 600 mg  600 mg Oral Q8H PRN Lacretia Leigh, MD      . LORazepam (ATIVAN) tablet 1 mg  1 mg Oral Q8H PRN Lacretia Leigh, MD      . ondansetron [facility]) tablet 4 mg  4 mg Oral Q8H PRN Lacretia Leigh, MD      . simvastatin (ZOCOR) tablet 20 mg  20 mg Oral Daily Lacretia Leigh, MD   20 mg at 08/08/15 6433   Current Outpatient Prescriptions  Medication Sig Dispense Refill  . citalopram (CELEXA) 10 MG tablet Take 1 tablet (10 mg total) by mouth daily. 30 tablet 0  .  donepezil (ARICEPT) 10 MG tablet TAKE 1 TABLET BY MOUTH AT BEDTIME. 90 tablet 1  . risperiDONE (RISPERDAL) 0.5 MG tablet Take 1 tablet (0.5 mg total) by mouth at bedtime. 30 tablet 0  . simvastatin (ZOCOR) 20 MG tablet TAKE 1 TABLET BY MOUTH DAILY. 90 tablet 1  . ENSURE PLUS (ENSURE PLUS) LIQD Take 237 mLs by mouth 2 (two) times daily before a meal. 30 minutes before 2 meals (Patient not taking: Reported on 08/03/2015) 14220 mL 0  . LORazepam (ATIVAN) 0.5 MG tablet Take 1 tablet (0.5 mg total) by mouth 2 (two) times daily as needed for anxiety (agitation). 30 tablet 0    Musculoskeletal: Strength & Muscle Tone: within normal limits Gait & Station: normal Patient leans: N/A  Psychiatric Specialty Exam: Physical Exam  Constitutional: She is oriented to person, place, and time. She appears well-developed and well-nourished.  HENT:  Head: Normocephalic.  Neck: Normal range of motion.  Respiratory: Effort normal.  Musculoskeletal: Normal range of motion.  Neurological: She is alert and oriented to person,  place, and time.  Skin: Skin is warm and dry.  Psychiatric: Her speech is normal. Judgment and thought content normal. Her mood appears anxious. She is agitated, aggressive, actively hallucinating and combative. Cognition and memory are normal.    Review of Systems  Constitutional: Negative.   HENT: Negative.   Eyes: Negative.   Respiratory: Negative.   Cardiovascular: Negative.   Gastrointestinal: Negative.   Genitourinary: Negative.   Musculoskeletal: Negative.   Skin: Negative.   Neurological: Negative.   Endo/Heme/Allergies: Negative.   Psychiatric/Behavioral: Positive for hallucinations. The patient is nervous/anxious.     Blood pressure 114/85, pulse 96, temperature 98.5 F (36.9 C), temperature source Oral, resp. rate 20, SpO2 98 %.There is no weight on file to calculate BMI.  General Appearance: Casual  Eye Contact:  Minimal  Speech:  Clear and Coherent  Volume:   Decreased  Mood:  Anxious and Irritable  Affect:  Constricted  Thought Process:  Disorganized and Descriptions of Associations: Tangential  Orientation:  Other:  place and person  Thought Content:  Delusions and Hallucinations: Visual  Suicidal Thoughts:  No  Homicidal Thoughts:  No  Memory:  Immediate;   Fair Recent;   Poor Remote;   Poor  Judgement:  Impaired  Insight:  Lacking  Psychomotor Activity:  Restlessness  Concentration:  Concentration: Fair and Attention Span: Fair  Recall:  [employer] of Knowledge:  Fair  Language:  Good  Akathisia:  No  Handed:  Right  AIMS (if indicated):     Assets:  Communication Skills Social Support  ADL's:  Impaired  Cognition:  Impaired,  Moderate  Sleep:   poor     Treatment Plan Summary: Daily contact with patient to assess and evaluate symptoms and progress in treatment.  Dementia with behavioral disturbances: PLAN: -Crisis stabilization -Continue Celexa to 71m daily for aggression, Haldol 0.581mBID for psychosis, Hydroxyzine 1058mid for anxiety/agitation, Aricept 10 mg daily for dementia, and Ativan 1 mg every 8 hours PRN anxiety. -Individual counseling  Disposition: Recommend psychiatric Inpatient admission when medically cleared. Supportive therapy provided about ongoing stressors. Patient will be referred to a Geriatric inpatient facility  LORWaylan BogaP 08/08/2015 10:11 AM Patient seen face-to-face for psychiatric evaluation, chart reviewed and case discussed with the physician extender and developed treatment plan. Reviewed the information documented and agree with the treatment plan. MojCorena PilgrimD

## 2015-08-08 NOTE — ED Notes (Addendum)
Pt is sitting on the side of the bed and is confused. At times pt mumbles . Pt remains safe . Pt needed to be prompted to eat and had to be shown her spoon and fork and how to put food on them. The writer sat with the pt while she ate her breakfast. Per charge nurse pt is not IVC'd. 9:30am Charge nurse made aware that Dr. Darleene Cleaver would like for the pt to have sitter. 12:30p_pt taken to the x-ray dept. 1:55pm - Pt s/p an EKG at the bedside. 6:15pm phoned Alben Spittle to give report. Shirlee Limerick asked that the next shift call before 8pm to give report. Phoned the sheriff for transportation. A message was left. (6:20pm)-7pm Report to oncoming shift.

## 2015-08-17 ENCOUNTER — Ambulatory Visit: Payer: Medicare Other | Admitting: Neurology

## 2015-08-31 MED FILL — ?SERTRALINE HCL 100 MG TAB: 100 | 30 days supply | Qty: 30 | Fill #0

## 2015-08-31 MED FILL — ?BUPROPION HCL XL 150 MG TA: 150 | 30 days supply | Qty: 30 | Fill #0

## 2015-08-31 MED FILL — ?MIRTAZAPINE 30 MG TABLET: 30 | 30 days supply | Qty: 30 | Fill #0

## 2015-08-31 MED FILL — ?OLANZAPINE 5 MG TABLET: 5 | 30 days supply | Qty: 30 | Fill #0

## 2015-10-06 MED FILL — DONEPEZIL HCL 10 MG TABLET: 10 | 30 days supply | Qty: 30 | Fill #3

## 2015-10-19 ENCOUNTER — Encounter: Payer: Self-pay | Admitting: Physician Assistant

## 2015-10-19 ENCOUNTER — Ambulatory Visit: Payer: Medicare Other | Attending: Internal Medicine | Admitting: Physician Assistant

## 2015-10-19 VITALS — BP 150/81 | HR 73 | Temp 98.3°F | Wt 120.4 lb

## 2015-10-19 DIAGNOSIS — F0391 Unspecified dementia with behavioral disturbance: Secondary | ICD-10-CM

## 2015-10-19 DIAGNOSIS — F03918 Unspecified dementia, unspecified severity, with other behavioral disturbance: Secondary | ICD-10-CM

## 2015-10-19 MED ORDER — OLANZAPINE 2.5 MG PO TABS: 5.0000 mg | ORAL_TABLET | Freq: Every day | ORAL | Status: DC

## 2015-10-19 MED ORDER — MIRTAZAPINE 30 MG PO TABS: 30.0000 mg | ORAL_TABLET | Freq: Every day | ORAL | 3 refills | Status: DC

## 2015-10-19 MED ORDER — BUPROPION HCL ER (XL) 150 MG PO TB24: 150.0000 mg | ORAL_TABLET | Freq: Every day | ORAL | 3 refills | Status: DC

## 2015-10-19 MED ORDER — SERTRALINE HCL 100 MG PO TABS: 100.0000 mg | ORAL_TABLET | Freq: Every day | ORAL | 3 refills | Status: DC

## 2015-10-19 MED FILL — ?MIRTAZAPINE 30 MG TABLET: 30 | 30 days supply | Qty: 30 | Fill #0

## 2015-10-19 MED FILL — ?BUPROPION HCL XL 150 MG TA: 150 | 30 days supply | Qty: 30 | Fill #0

## 2015-10-19 MED FILL — ?SERTRALINE HCL 100 MG TAB: 100 | 30 days supply | Qty: 30 | Fill #0

## 2015-10-19 NOTE — Progress Notes (Signed)
Pt is here today with her son for the following:  ED follow up for dementia Medication refill

## 2015-10-19 NOTE — Progress Notes (Addendum)
Deanna [name]  [id number]  [id number]  DOB - 10/30/1945  Chief Complaint  Patient presents with  . Hospitalization Follow-up    Ed - Dementia  . Medication Refill       Subjective:   Deanna [name] is a 70 y.o. female here today for hospital follow up. She has a history of dementia, hyperlipidemia and hypertension. She actually was hospitalized back in June of 2017. This was for a behavioral disturbance. She had suddenly become combative and aggressive with her family. Her son took her to the [facility] emergency department and multiple medications were tried. All labs were ok. No imaging studies available except for a normal chest xray. Ultimately she went home on a regimen of Zyprexa 5 mg daily: Mirtazapine 30 mg nightly: Zoloft 100 mg daily: and Wellbutrin 150 mg daily. She's been compliant with her medications. They have not seen any more behavioral disturbances. She has no new complaints today. She came in because she is out of medications and is in need of refills.  ROS: GEN: denies fever or chills, denies change in weight Skin: denies lesions or rashes HEENT: denies headache, earache, epistaxis, sore throat, or neck pain LUNGS: denies SHOB, dyspnea, PND, orthopnea CV: denies CP or palpitations ABD: denies abd pain, N or V EXT: denies muscle spasms or swelling; no pain in lower ext, no weakness NEURO: denies numbness or tingling, denies sz, stroke or TIA  ALLERGIES: No Known Allergies  PAST MEDICAL HISTORY: Past Medical History:  Diagnosis Date  . Dementia Dx 2015  . Hyperlipidemia DX 2015  . Hypertension Dx 2015    PAST SURGICAL HISTORY: Past Surgical History:  Procedure Laterality Date  . [place]: Prior to Admission medications   Medication Sig Start Date End Date Taking? Authorizing Provider  buPROPion (WELLBUTRIN XL) 150 MG 24 hr tablet Take 1 tablet (150 mg total) by mouth daily. 10/19/15 10/18/16 Yes  Tauna Macfarlane Daneil Dan, PA-C  cholecalciferol (VITAMIN D) 1000 units tablet Take 1,000 Units by mouth daily. 08/29/15 08/28/16 Yes Historical Provider, MD  donepezil (ARICEPT) 10 MG tablet TAKE 1 TABLET BY MOUTH AT BEDTIME. 04/22/15  Yes Josalyn Funches, MD  Multiple Vitamin (THERA) TABS Take 100 mg by mouth daily. 08/29/15  Yes Historical Provider, MD  sertraline (ZOLOFT) 100 MG tablet Take 1 tablet (100 mg total) by mouth daily. 10/19/15 10/18/16 Yes Creighton Longley Daneil Dan, PA-C  simvastatin (ZOCOR) 20 MG tablet TAKE 1 TABLET BY MOUTH DAILY. 04/22/15  Yes Josalyn Funches, MD  mirtazapine (REMERON) 30 MG tablet Take 1 tablet (30 mg total) by mouth at bedtime. 10/19/15   Brayton Caves, PA-C     Objective:   Vitals:   10/19/15 1218  BP: (!) 150/81  Pulse: 73  Temp: 98.3 F (36.8 C)  TempSrc: Oral  Weight: 120 lb 6.4 oz (54.6 kg)    Exam General appearance : Awake, alert, not in any distress. Speech Clear. Not toxic looking HEENT: Atraumatic and Normocephalic, pupils equally reactive to light and accomodation Neck: supple, no JVD. No cervical lymphadenopathy.  Chest:Good air entry bilaterally, no added sounds  CVS: S1 S2 regular, no murmurs.  Abdomen: Bowel sounds present, Non tender and not distended with no gaurding, rigidity or rebound. Extremities: B/L Lower Ext shows no edema, both legs are warm to touch Neurology: Awake alert, and oriented X 1, CN II-XII intact, Non focal   Assessment & Plan  1. Dementia with behavioral disturbance  -Cont  current med regimen-refills provided  -Cont Supportive care of family 2. Elevated BP  -repeat in 2 weeks (she is a little anxious today)   Return in about 2 weeks (around 11/02/2015). for routine health maintenance and BP check.   The patient was given clear instructions to go to ER or return to medical center if symptoms don't improve, worsen or new problems develop. The patient verbalized understanding. The patient was told to call to get lab results if  they haven't heard anything in the next week.   This note has been created with Surveyor, quantity. Any transcriptional errors are unintentional.    Zettie Pho, PA-C [facility] [place], [place]   10/19/2015, 12:45 PM

## 2015-10-29 DIAGNOSIS — E870 Hyperosmolality and hypernatremia: Secondary | ICD-10-CM | POA: Diagnosis not present

## 2015-10-29 DIAGNOSIS — Z046 Encounter for general psychiatric examination, requested by authority: Secondary | ICD-10-CM | POA: Diagnosis not present

## 2015-10-29 DIAGNOSIS — R4182 Altered mental status, unspecified: Secondary | ICD-10-CM | POA: Diagnosis not present

## 2015-10-29 DIAGNOSIS — E785 Hyperlipidemia, unspecified: Secondary | ICD-10-CM | POA: Diagnosis not present

## 2015-10-29 DIAGNOSIS — E440 Moderate protein-calorie malnutrition: Secondary | ICD-10-CM | POA: Diagnosis not present

## 2015-10-29 DIAGNOSIS — F323 Major depressive disorder, single episode, severe with psychotic features: Secondary | ICD-10-CM | POA: Diagnosis not present

## 2015-10-29 DIAGNOSIS — F0391 Unspecified dementia with behavioral disturbance: Secondary | ICD-10-CM | POA: Diagnosis not present

## 2015-10-29 DIAGNOSIS — N390 Urinary tract infection, site not specified: Secondary | ICD-10-CM | POA: Diagnosis not present

## 2015-10-29 DIAGNOSIS — Z681 Body mass index (BMI) 19 or less, adult: Secondary | ICD-10-CM | POA: Diagnosis not present

## 2015-10-29 DIAGNOSIS — N179 Acute kidney failure, unspecified: Secondary | ICD-10-CM | POA: Diagnosis not present

## 2015-10-30 DIAGNOSIS — Z046 Encounter for general psychiatric examination, requested by authority: Secondary | ICD-10-CM | POA: Diagnosis not present

## 2015-10-31 ENCOUNTER — Telehealth: Payer: Self-pay | Admitting: Family Medicine

## 2015-10-31 DIAGNOSIS — F0391 Unspecified dementia with behavioral disturbance: Secondary | ICD-10-CM

## 2015-10-31 DIAGNOSIS — E782 Mixed hyperlipidemia: Secondary | ICD-10-CM | POA: Diagnosis not present

## 2015-10-31 DIAGNOSIS — R9431 Abnormal electrocardiogram [ECG] [EKG]: Secondary | ICD-10-CM | POA: Diagnosis not present

## 2015-10-31 DIAGNOSIS — F323 Major depressive disorder, single episode, severe with psychotic features: Secondary | ICD-10-CM | POA: Diagnosis not present

## 2015-10-31 DIAGNOSIS — E876 Hypokalemia: Secondary | ICD-10-CM | POA: Diagnosis not present

## 2015-10-31 DIAGNOSIS — N179 Acute kidney failure, unspecified: Secondary | ICD-10-CM | POA: Diagnosis not present

## 2015-10-31 DIAGNOSIS — E559 Vitamin D deficiency, unspecified: Secondary | ICD-10-CM | POA: Diagnosis not present

## 2015-10-31 DIAGNOSIS — E870 Hyperosmolality and hypernatremia: Secondary | ICD-10-CM | POA: Diagnosis not present

## 2015-10-31 DIAGNOSIS — I10 Essential (primary) hypertension: Secondary | ICD-10-CM | POA: Diagnosis present

## 2015-10-31 DIAGNOSIS — F0281 Dementia in other diseases classified elsewhere with behavioral disturbance: Secondary | ICD-10-CM | POA: Diagnosis not present

## 2015-10-31 DIAGNOSIS — F03918 Unspecified dementia, unspecified severity, with other behavioral disturbance: Secondary | ICD-10-CM

## 2015-10-31 DIAGNOSIS — K5901 Slow transit constipation: Secondary | ICD-10-CM | POA: Diagnosis not present

## 2015-10-31 DIAGNOSIS — I4581 Long QT syndrome: Secondary | ICD-10-CM | POA: Diagnosis not present

## 2015-10-31 DIAGNOSIS — E440 Moderate protein-calorie malnutrition: Secondary | ICD-10-CM | POA: Diagnosis not present

## 2015-10-31 DIAGNOSIS — Z111 Encounter for screening for respiratory tuberculosis: Secondary | ICD-10-CM | POA: Diagnosis not present

## 2015-10-31 DIAGNOSIS — Z681 Body mass index (BMI) 19 or less, adult: Secondary | ICD-10-CM | POA: Diagnosis not present

## 2015-10-31 DIAGNOSIS — F333 Major depressive disorder, recurrent, severe with psychotic symptoms: Secondary | ICD-10-CM | POA: Diagnosis not present

## 2015-10-31 DIAGNOSIS — G309 Alzheimer's disease, unspecified: Secondary | ICD-10-CM | POA: Diagnosis not present

## 2015-10-31 DIAGNOSIS — E785 Hyperlipidemia, unspecified: Secondary | ICD-10-CM | POA: Diagnosis present

## 2015-10-31 DIAGNOSIS — Z79899 Other long term (current) drug therapy: Secondary | ICD-10-CM | POA: Diagnosis not present

## 2015-10-31 NOTE — Telephone Encounter (Signed)
Patient needs pyrexa to be sent to the pharmacy. Please follow up.

## 2015-11-03 MED ORDER — OLANZAPINE 5 MG PO TABS: 5.0000 mg | ORAL_TABLET | Freq: Every day | ORAL | 2 refills | Status: DC

## 2015-11-03 NOTE — Telephone Encounter (Signed)
Pt was called on 8/31, mailbox was full so a message could not be left.

## 2015-11-03 NOTE — Telephone Encounter (Signed)
Zyprexa refilled Patient also referred to neuropsychology

## 2015-11-04 ENCOUNTER — Ambulatory Visit: Payer: Self-pay | Admitting: Family Medicine

## 2015-11-15 DIAGNOSIS — E784 Other hyperlipidemia: Secondary | ICD-10-CM | POA: Diagnosis not present

## 2015-11-15 DIAGNOSIS — F329 Major depressive disorder, single episode, unspecified: Secondary | ICD-10-CM | POA: Diagnosis not present

## 2015-11-15 DIAGNOSIS — G309 Alzheimer's disease, unspecified: Secondary | ICD-10-CM | POA: Diagnosis not present

## 2015-11-15 DIAGNOSIS — I10 Essential (primary) hypertension: Secondary | ICD-10-CM | POA: Diagnosis not present

## 2015-11-15 DIAGNOSIS — R5381 Other malaise: Secondary | ICD-10-CM | POA: Diagnosis not present

## 2015-11-16 DIAGNOSIS — Z79899 Other long term (current) drug therapy: Secondary | ICD-10-CM | POA: Diagnosis not present

## 2015-11-16 DIAGNOSIS — E785 Hyperlipidemia, unspecified: Secondary | ICD-10-CM | POA: Diagnosis not present

## 2015-12-06 DIAGNOSIS — E785 Hyperlipidemia, unspecified: Secondary | ICD-10-CM | POA: Diagnosis not present

## 2015-12-06 DIAGNOSIS — F0280 Dementia in other diseases classified elsewhere without behavioral disturbance: Secondary | ICD-10-CM | POA: Diagnosis not present

## 2015-12-06 DIAGNOSIS — G309 Alzheimer's disease, unspecified: Secondary | ICD-10-CM | POA: Diagnosis not present

## 2015-12-06 DIAGNOSIS — E559 Vitamin D deficiency, unspecified: Secondary | ICD-10-CM | POA: Diagnosis not present

## 2015-12-21 DIAGNOSIS — E785 Hyperlipidemia, unspecified: Secondary | ICD-10-CM | POA: Diagnosis not present

## 2015-12-21 DIAGNOSIS — I10 Essential (primary) hypertension: Secondary | ICD-10-CM | POA: Diagnosis not present

## 2015-12-21 DIAGNOSIS — R634 Abnormal weight loss: Secondary | ICD-10-CM | POA: Diagnosis not present

## 2015-12-21 DIAGNOSIS — K5900 Constipation, unspecified: Secondary | ICD-10-CM | POA: Diagnosis not present

## 2015-12-21 DIAGNOSIS — G4700 Insomnia, unspecified: Secondary | ICD-10-CM | POA: Diagnosis not present

## 2015-12-21 DIAGNOSIS — F0281 Dementia in other diseases classified elsewhere with behavioral disturbance: Secondary | ICD-10-CM | POA: Diagnosis not present

## 2015-12-21 DIAGNOSIS — F329 Major depressive disorder, single episode, unspecified: Secondary | ICD-10-CM | POA: Diagnosis not present

## 2015-12-21 DIAGNOSIS — E559 Vitamin D deficiency, unspecified: Secondary | ICD-10-CM | POA: Diagnosis not present

## 2015-12-28 DIAGNOSIS — K5900 Constipation, unspecified: Secondary | ICD-10-CM | POA: Diagnosis not present

## 2015-12-28 DIAGNOSIS — I10 Essential (primary) hypertension: Secondary | ICD-10-CM | POA: Diagnosis not present

## 2015-12-28 DIAGNOSIS — E785 Hyperlipidemia, unspecified: Secondary | ICD-10-CM | POA: Diagnosis not present

## 2015-12-28 DIAGNOSIS — G4700 Insomnia, unspecified: Secondary | ICD-10-CM | POA: Diagnosis not present

## 2016-01-04 DIAGNOSIS — G309 Alzheimer's disease, unspecified: Secondary | ICD-10-CM | POA: Diagnosis not present

## 2016-01-04 DIAGNOSIS — K5900 Constipation, unspecified: Secondary | ICD-10-CM | POA: Diagnosis not present

## 2016-01-04 DIAGNOSIS — G4700 Insomnia, unspecified: Secondary | ICD-10-CM | POA: Diagnosis not present

## 2016-01-04 DIAGNOSIS — I10 Essential (primary) hypertension: Secondary | ICD-10-CM | POA: Diagnosis not present

## 2016-01-04 DIAGNOSIS — F329 Major depressive disorder, single episode, unspecified: Secondary | ICD-10-CM | POA: Diagnosis not present

## 2016-01-04 DIAGNOSIS — R634 Abnormal weight loss: Secondary | ICD-10-CM | POA: Diagnosis not present

## 2016-01-04 DIAGNOSIS — E785 Hyperlipidemia, unspecified: Secondary | ICD-10-CM | POA: Diagnosis not present

## 2016-01-18 DIAGNOSIS — F419 Anxiety disorder, unspecified: Secondary | ICD-10-CM | POA: Diagnosis not present

## 2016-01-18 DIAGNOSIS — R634 Abnormal weight loss: Secondary | ICD-10-CM | POA: Diagnosis not present

## 2016-01-18 DIAGNOSIS — K5900 Constipation, unspecified: Secondary | ICD-10-CM | POA: Diagnosis not present

## 2016-01-18 DIAGNOSIS — G4700 Insomnia, unspecified: Secondary | ICD-10-CM | POA: Diagnosis not present

## 2016-01-18 DIAGNOSIS — F329 Major depressive disorder, single episode, unspecified: Secondary | ICD-10-CM | POA: Diagnosis not present

## 2016-01-18 DIAGNOSIS — I10 Essential (primary) hypertension: Secondary | ICD-10-CM | POA: Diagnosis not present

## 2016-01-18 DIAGNOSIS — G309 Alzheimer's disease, unspecified: Secondary | ICD-10-CM | POA: Diagnosis not present

## 2016-01-18 DIAGNOSIS — E785 Hyperlipidemia, unspecified: Secondary | ICD-10-CM | POA: Diagnosis not present

## 2016-03-05 DIAGNOSIS — G4700 Insomnia, unspecified: Secondary | ICD-10-CM | POA: Diagnosis not present

## 2016-03-05 DIAGNOSIS — R634 Abnormal weight loss: Secondary | ICD-10-CM | POA: Diagnosis not present

## 2016-03-05 DIAGNOSIS — F419 Anxiety disorder, unspecified: Secondary | ICD-10-CM | POA: Diagnosis not present

## 2016-03-05 DIAGNOSIS — F329 Major depressive disorder, single episode, unspecified: Secondary | ICD-10-CM | POA: Diagnosis not present

## 2016-03-05 DIAGNOSIS — K5900 Constipation, unspecified: Secondary | ICD-10-CM | POA: Diagnosis not present

## 2016-03-05 DIAGNOSIS — G309 Alzheimer's disease, unspecified: Secondary | ICD-10-CM | POA: Diagnosis not present

## 2016-03-05 DIAGNOSIS — I10 Essential (primary) hypertension: Secondary | ICD-10-CM | POA: Diagnosis not present

## 2016-03-23 DIAGNOSIS — Z79899 Other long term (current) drug therapy: Secondary | ICD-10-CM | POA: Diagnosis not present

## 2016-03-23 DIAGNOSIS — E784 Other hyperlipidemia: Secondary | ICD-10-CM | POA: Diagnosis not present

## 2016-03-23 DIAGNOSIS — E559 Vitamin D deficiency, unspecified: Secondary | ICD-10-CM | POA: Diagnosis not present

## 2016-04-02 DIAGNOSIS — R634 Abnormal weight loss: Secondary | ICD-10-CM | POA: Diagnosis not present

## 2016-04-02 DIAGNOSIS — F419 Anxiety disorder, unspecified: Secondary | ICD-10-CM | POA: Diagnosis not present

## 2016-04-02 DIAGNOSIS — E785 Hyperlipidemia, unspecified: Secondary | ICD-10-CM | POA: Diagnosis not present

## 2016-04-02 DIAGNOSIS — G309 Alzheimer's disease, unspecified: Secondary | ICD-10-CM | POA: Diagnosis not present

## 2016-04-02 DIAGNOSIS — K5900 Constipation, unspecified: Secondary | ICD-10-CM | POA: Diagnosis not present

## 2016-04-02 DIAGNOSIS — F329 Major depressive disorder, single episode, unspecified: Secondary | ICD-10-CM | POA: Diagnosis not present

## 2016-04-02 DIAGNOSIS — G4700 Insomnia, unspecified: Secondary | ICD-10-CM | POA: Diagnosis not present

## 2016-04-02 DIAGNOSIS — I10 Essential (primary) hypertension: Secondary | ICD-10-CM | POA: Diagnosis not present

## 2016-04-30 DIAGNOSIS — F329 Major depressive disorder, single episode, unspecified: Secondary | ICD-10-CM | POA: Diagnosis not present

## 2016-04-30 DIAGNOSIS — R634 Abnormal weight loss: Secondary | ICD-10-CM | POA: Diagnosis not present

## 2016-04-30 DIAGNOSIS — E785 Hyperlipidemia, unspecified: Secondary | ICD-10-CM | POA: Diagnosis not present

## 2016-04-30 DIAGNOSIS — G4700 Insomnia, unspecified: Secondary | ICD-10-CM | POA: Diagnosis not present

## 2016-04-30 DIAGNOSIS — F419 Anxiety disorder, unspecified: Secondary | ICD-10-CM | POA: Diagnosis not present

## 2016-04-30 DIAGNOSIS — K5900 Constipation, unspecified: Secondary | ICD-10-CM | POA: Diagnosis not present

## 2016-04-30 DIAGNOSIS — I10 Essential (primary) hypertension: Secondary | ICD-10-CM | POA: Diagnosis not present

## 2016-04-30 DIAGNOSIS — F0281 Dementia in other diseases classified elsewhere with behavioral disturbance: Secondary | ICD-10-CM | POA: Diagnosis not present

## 2016-05-15 DIAGNOSIS — Z79899 Other long term (current) drug therapy: Secondary | ICD-10-CM | POA: Diagnosis not present

## 2016-05-28 DIAGNOSIS — F329 Major depressive disorder, single episode, unspecified: Secondary | ICD-10-CM | POA: Diagnosis not present

## 2016-05-28 DIAGNOSIS — F419 Anxiety disorder, unspecified: Secondary | ICD-10-CM | POA: Diagnosis not present

## 2016-05-28 DIAGNOSIS — R634 Abnormal weight loss: Secondary | ICD-10-CM | POA: Diagnosis not present

## 2016-05-28 DIAGNOSIS — G4700 Insomnia, unspecified: Secondary | ICD-10-CM | POA: Diagnosis not present

## 2016-05-28 DIAGNOSIS — I10 Essential (primary) hypertension: Secondary | ICD-10-CM | POA: Diagnosis not present

## 2016-05-28 DIAGNOSIS — G309 Alzheimer's disease, unspecified: Secondary | ICD-10-CM | POA: Diagnosis not present

## 2016-05-28 DIAGNOSIS — E785 Hyperlipidemia, unspecified: Secondary | ICD-10-CM | POA: Diagnosis not present

## 2016-05-28 DIAGNOSIS — K5900 Constipation, unspecified: Secondary | ICD-10-CM | POA: Diagnosis not present

## 2016-06-06 ENCOUNTER — Encounter (HOSPITAL_COMMUNITY): Payer: Self-pay

## 2016-06-06 ENCOUNTER — Emergency Department (HOSPITAL_COMMUNITY)
Admission: EM | Admit: 2016-06-06 | Discharge: 2016-06-06 | Disposition: A | Discharge status: Home / Self Care | Payer: Medicare Other | Attending: Emergency Medicine | Admitting: Emergency Medicine

## 2016-06-06 ENCOUNTER — Emergency Department (HOSPITAL_COMMUNITY): Payer: Medicare Other

## 2016-06-06 DIAGNOSIS — R220 Localized swelling, mass and lump, head: Secondary | ICD-10-CM | POA: Diagnosis present

## 2016-06-06 DIAGNOSIS — I10 Essential (primary) hypertension: Secondary | ICD-10-CM | POA: Diagnosis not present

## 2016-06-06 DIAGNOSIS — S0993XA Unspecified injury of face, initial encounter: Secondary | ICD-10-CM | POA: Diagnosis not present

## 2016-06-06 DIAGNOSIS — R402441 Other coma, without documented Glasgow coma scale score, or with partial score reported, in the field [EMT or ambulance]: Secondary | ICD-10-CM | POA: Diagnosis not present

## 2016-06-06 DIAGNOSIS — D367 Benign neoplasm of other specified sites: Secondary | ICD-10-CM | POA: Insufficient documentation

## 2016-06-06 DIAGNOSIS — R4182 Altered mental status, unspecified: Secondary | ICD-10-CM | POA: Diagnosis not present

## 2016-06-06 DIAGNOSIS — D2330 Other benign neoplasm of skin of unspecified part of face: Secondary | ICD-10-CM

## 2016-06-06 DIAGNOSIS — Z79899 Other long term (current) drug therapy: Secondary | ICD-10-CM | POA: Insufficient documentation

## 2016-06-06 LAB — CBC WITH DIFFERENTIAL/PLATELET
Basophils Absolute: 0 10*3/uL (ref 0.0–0.1)
Basophils Relative: 0 %
EOS ABS: 0.1 10*3/uL (ref 0.0–0.7)
Eosinophils Relative: 2 %
HEMATOCRIT: 42.7 % (ref 36.0–46.0)
HEMOGLOBIN: 13.7 g/dL (ref 12.0–15.0)
LYMPHS ABS: 1.6 10*3/uL (ref 0.7–4.0)
LYMPHS PCT: 18 %
MCH: 28.4 pg (ref 26.0–34.0)
MCHC: 32.1 g/dL (ref 30.0–36.0)
MCV: 88.6 fL (ref 78.0–100.0)
MONOS PCT: 7 %
Monocytes Absolute: 0.6 10*3/uL (ref 0.1–1.0)
NEUTROS ABS: 6.5 10*3/uL (ref 1.7–7.7)
NEUTROS PCT: 73 %
Platelets: 620 10*3/uL — ABNORMAL HIGH (ref 150–400)
RBC: 4.82 MIL/uL (ref 3.87–5.11)
RDW: 13.4 % (ref 11.5–15.5)
WBC: 8.9 10*3/uL (ref 4.0–10.5)

## 2016-06-06 LAB — BASIC METABOLIC PANEL
Anion gap: 8 (ref 5–15)
BUN: 12 mg/dL (ref 6–20)
CHLORIDE: 103 mmol/L (ref 101–111)
CO2: 32 mmol/L (ref 22–32)
Calcium: 9.1 mg/dL (ref 8.9–10.3)
Creatinine, Ser: 0.94 mg/dL (ref 0.44–1.00)
GFR calc Af Amer: >60 mL/min (ref >60)
GFR calc non Af Amer: >60 mL/min (ref >60)
Glucose, Bld: 113 mg/dL — ABNORMAL HIGH (ref 65–99)
POTASSIUM: 3.6 mmol/L (ref 3.5–5.1)
SODIUM: 143 mmol/L (ref 135–145)

## 2016-06-06 MED ORDER — SODIUM CHLORIDE 0.9 % IV SOLN
INTRAVENOUS | Status: DC
  Administered 2016-06-06: 13:00:00 via INTRAVENOUS

## 2016-06-06 MED ORDER — IOPAMIDOL (ISOVUE-300) INJECTION 61%
75.0000 mL | Freq: Once | INTRAVENOUS | Status: AC | PRN
  Administered 2016-06-06: 75 mL via INTRAVENOUS

## 2016-06-06 MED ORDER — IOPAMIDOL (ISOVUE-300) INJECTION 61%
INTRAVENOUS | Status: AC
  Filled 2016-06-06: qty 75

## 2016-06-06 MED ORDER — DOXYCYCLINE HYCLATE 100 MG PO CAPS
100.0000 mg | ORAL_CAPSULE | Freq: Two times a day (BID) | ORAL | 0 refills | Status: DC
Start: 2016-06-06 — End: 2016-09-01

## 2016-06-06 MED ORDER — BACITRACIN ZINC 500 UNIT/GM EX OINT
TOPICAL_OINTMENT | Freq: Once | CUTANEOUS | Status: AC
  Administered 2016-06-06: 1 via TOPICAL
  Filled 2016-06-06: qty 0.9

## 2016-06-06 MED ORDER — LIDOCAINE HCL (PF) 1 % IJ SOLN
5.0000 mL | Freq: Once | INTRAMUSCULAR | Status: AC
  Administered 2016-06-06: 5 mL
  Filled 2016-06-06: qty 30

## 2016-06-06 NOTE — ED Notes (Signed)
[facility] called for transport for Pt back to [facility].

## 2016-06-06 NOTE — ED Notes (Signed)
Dressing change completed.

## 2016-06-06 NOTE — Discharge Instructions (Signed)
Take the medications as prescribed, antibiotic ointment to the wound and change the dressing daily, cyst like material was incised and removed from the wound during the visit today

## 2016-06-06 NOTE — ED Triage Notes (Signed)
PT RECEIVED FROM [facility] FOR SWELLING TO THE LEFT CHEEK X2 DAYS, PER STAFF. PER EMS, THE PT CAME FROM THE MEMORY UNIT WITH A HX OF DEMENTIA. PER STAFF THERE WAS NO INJURY, AND  PT IS AT HER BASELINE OF AAOX1. DENIED PAIN ON PALPATION. AREA TO THE CHEEK IS RAISED AND HARD.

## 2016-06-06 NOTE — ED Provider Notes (Signed)
[facility] DEPT Provider Note   CSN: [id number] Arrival date & time: 06/06/16  1130     History   Chief Complaint Chief Complaint  Patient presents with  . Facial Swelling    L CHEEK    HPI Deanna [name] is a 71 y.o. female.  HPI Pt presents to the ED with left cheek swelling.  She has a history of dementia and resides in a nursing home so history is limited.  Staff noticed swelling on the left side of the cheek 2 days ago.  No known injury.  No history of fever or other consitutional symptoms.  Pt denies any complaints. Past Medical History:  Diagnosis Date  . Dementia Dx 2015  . Hyperlipidemia DX 2015  . Hypertension Dx 2015    Patient Active Problem List   Diagnosis Date Noted  . Dementia with behavioral disturbance 08/04/2015  . Increased ammonia level 06/15/2014  . Left flank pain 06/15/2014  . Cognitive changes 03/24/2014  . Fatigue 03/24/2014  . Vitreous floaters of right eye 03/11/2014  . Underweight 03/11/2014  . HTN (hypertension) 12/16/2013  . HLD (hyperlipidemia) 12/16/2013  . Dementia 12/16/2013    Past Surgical History:  Procedure Laterality Date  . [place]     OB History    No data available       Home Medications    Prior to Admission medications   Medication Sig Start Date End Date Taking? Authorizing Provider  buPROPion (WELLBUTRIN XL) 150 MG 24 hr tablet Take 1 tablet (150 mg total) by mouth daily. 10/19/15 10/18/16 Yes Tiffany Daneil Dan, PA-C  LORazepam (ATIVAN) 0.5 MG tablet Take 0.5 mg by mouth every 12 (twelve) hours as needed for anxiety (and/or insomnia).   Yes Historical Provider, MD  mirtazapine (REMERON) 15 MG tablet Take 15 mg by mouth at bedtime.   Yes Historical Provider, MD  Multiple Vitamin (THERA) TABS Take 1 tablet by mouth daily.    Yes Historical Provider, MD  OLANZapine (ZYPREXA) 5 MG tablet Take 1 tablet (5 mg total) by mouth at bedtime. 11/03/15  Yes Josalyn Funches, MD  polyethylene glycol (MIRALAX  / GLYCOLAX) packet Take 17 g by mouth daily as needed for mild constipation.   Yes Historical Provider, MD  senna-docusate (SENOKOT-S) 8.6-50 MG tablet Take 1 tablet by mouth 2 (two) times daily.   Yes Historical Provider, MD  sertraline (ZOLOFT) 100 MG tablet Take 1 tablet (100 mg total) by mouth daily. 10/19/15 10/18/16 Yes Tiffany Daneil Dan, PA-C  doxycycline (VIBRAMYCIN) 100 MG capsule Take 1 capsule (100 mg total) by mouth 2 (two) times daily. 06/06/16   Dorie Rank, MD    Family History Family History  Problem Relation Age of Onset  . Cancer Neg Hx   . Heart disease Neg Hx   . Dementia Neg Hx     Social History Social History  Substance Use Topics  . Smoking status: Never Smoker  . Smokeless tobacco: Never Used  . Alcohol use No     Allergies   Patient has no known allergies.   Review of Systems Review of Systems  All other systems reviewed and are negative.    Physical Exam Updated Vital Signs BP (!) 153/80   Pulse 95   Temp 98.9 F (37.2 C) (Oral)   Resp 16   Ht 5\' 5"  (1.651 m)   Wt 54.4 kg   SpO2 100%   BMI 19.97 kg/m   Physical Exam  Constitutional: No distress.  HENT:  Head: Normocephalic and atraumatic.    Right Ear: External ear normal.  Left Ear: External ear normal.  Edema and induration left cheek, ?fluctuance, no periorbital edema. No submandibular swelling  Eyes: Conjunctivae are normal. Right eye exhibits no discharge. Left eye exhibits no discharge. No scleral icterus.  Neck: Neck supple. No tracheal deviation present.  Cardiovascular: Normal rate, regular rhythm and intact distal pulses.   Pulmonary/Chest: Effort normal and breath sounds normal. No stridor. No respiratory distress. She has no wheezes. She has no rales.  Abdominal: Soft. Bowel sounds are normal. She exhibits no distension. There is no tenderness. There is no rebound and no guarding.  Musculoskeletal: She exhibits no edema or tenderness.  Neurological: She is alert. She has  normal strength. No cranial nerve deficit (no facial droop, extraocular movements intact, no slurred speech) or sensory deficit. She exhibits normal muscle tone. She displays no seizure activity. Coordination normal.  Skin: Skin is warm and dry. No rash noted. She is not diaphoretic.  Psychiatric: She has a normal mood and affect.  Nursing note and vitals reviewed.    ED Treatments / Results  Labs (all labs ordered are listed, but only abnormal results are displayed) Labs Reviewed  CBC WITH DIFFERENTIAL/PLATELET - Abnormal; Notable for the following:       Result Value   Platelets 620 (*)    All other components within normal limits  BASIC METABOLIC PANEL - Abnormal; Notable for the following:    Glucose, Bld 113 (*)    All other components within normal limits    Radiology Ct Maxillofacial W Contrast  Result Date: 06/06/2016 CLINICAL DATA:  Left facial swelling, question abscess EXAM: CT MAXILLOFACIAL WITH CONTRAST TECHNIQUE: Multidetector CT imaging of the maxillofacial structures was performed. Multiplanar CT image reconstructions were also generated. A small metallic BB was placed on the right temple in order to reliably differentiate right from left. CONTRAST:  53mL ISOVUE-300 IOPAMIDOL (ISOVUE-300) INJECTION 61% COMPARISON:  None. FINDINGS: Image quality degraded by motion. Multiple images were repeated due to motion. Osseous: Negative for fracture. No evidence of osteomyelitis. The patient is edentulous. Orbits: Negative Sinuses: Mild mucosal edema in the maxillary sinus bilaterally. Otherwise clear. Soft tissues: 13 mm Rim enhancing fluid collection in the left maxillary soft tissues below the zygomatic arch. This extends to the skin surface and has some surrounding edema. This is most consistent with a dermal abscess. Limited intracranial: Negative IMPRESSION: 13 mm dermal abscess below the zygomatic arch. Electronically Signed   By: Franchot Gallo M.D.   On: 06/06/2016 14:37     Procedures .Marland KitchenIncision and Drainage Date/Time: 06/06/2016 3:22 PM Performed by: Dorie Rank Authorized by: Dorie Rank   Consent:    Consent obtained:  Verbal   Consent given by:  Patient   Risks discussed:  Bleeding, incomplete drainage, pain and damage to other organs   Alternatives discussed:  No treatment Location:    Type:  Cyst   Location:  Head   Head/neck location: left cheek. Pre-procedure details:    Skin preparation:  Betadine Anesthesia (see MAR for exact dosages):    Anesthesia method:  Local infiltration   Local anesthetic:  Lidocaine 1% w/o epi Procedure type:    Complexity:  Simple Procedure details:    Incision types:  Single straight   Incision depth:  Subcutaneous   Scalpel blade:  11   Wound management:  Debrided   Drainage characteristics: sebaceous material.   Drainage amount:  Copious   Wound treatment:  Wound left open Post-procedure details:    Patient tolerance of procedure:  Tolerated well, no immediate complications   (including critical care time)  Medications Ordered in ED Medications  0.9 %  sodium chloride infusion ( Intravenous Stopped 06/06/16 1523)  iopamidol (ISOVUE-300) 61 % injection (not administered)  iopamidol (ISOVUE-300) 61 % injection 75 mL (75 mLs Intravenous Contrast Given 06/06/16 1407)  lidocaine (PF) (XYLOCAINE) 1 % injection 5 mL (5 mLs Infiltration Given by Other 06/06/16 1449)     Initial Impression / Assessment and Plan / ED Course  I have reviewed the triage vital signs and the nursing notes.  Pertinent labs & imaging results that were available during my care of the patient were reviewed by me and considered in my medical decision making (see chart for details).   CT scan suggests the possibility of the cutaneous abscess without any underlying abnormality.  Laboratory tests are reassuring. Patient's area of swelling was incised and drained and it turns out that it was a collection of sebaceous material.  Small amount  of purulent material within the cyst. Patient be discharged home with a course of doxycycline.    Final Clinical Impressions(s) / ED Diagnoses   Final diagnoses:  Cyst, dermoid, face    New Prescriptions New Prescriptions   DOXYCYCLINE (VIBRAMYCIN) 100 MG CAPSULE    Take 1 capsule (100 mg total) by mouth 2 (two) times daily.     Dorie Rank, MD 06/06/16 [phone]

## 2016-06-12 DIAGNOSIS — E785 Hyperlipidemia, unspecified: Secondary | ICD-10-CM | POA: Diagnosis not present

## 2016-06-12 DIAGNOSIS — I10 Essential (primary) hypertension: Secondary | ICD-10-CM | POA: Diagnosis not present

## 2016-06-12 DIAGNOSIS — R634 Abnormal weight loss: Secondary | ICD-10-CM | POA: Diagnosis not present

## 2016-06-12 DIAGNOSIS — L723 Sebaceous cyst: Secondary | ICD-10-CM | POA: Diagnosis not present

## 2016-06-12 DIAGNOSIS — F0391 Unspecified dementia with behavioral disturbance: Secondary | ICD-10-CM | POA: Diagnosis not present

## 2016-06-12 DIAGNOSIS — R54 Age-related physical debility: Secondary | ICD-10-CM | POA: Diagnosis not present

## 2016-06-21 ENCOUNTER — Emergency Department (HOSPITAL_COMMUNITY)
Admission: EM | Admit: 2016-06-21 | Discharge: 2016-06-21 | Disposition: A | Discharge status: Home / Self Care | Payer: Medicare Other | Attending: Emergency Medicine | Admitting: Emergency Medicine

## 2016-06-21 ENCOUNTER — Encounter (HOSPITAL_COMMUNITY): Payer: Self-pay | Admitting: Emergency Medicine

## 2016-06-21 DIAGNOSIS — Y929 Unspecified place or not applicable: Secondary | ICD-10-CM | POA: Diagnosis not present

## 2016-06-21 DIAGNOSIS — X58XXXA Exposure to other specified factors, initial encounter: Secondary | ICD-10-CM | POA: Insufficient documentation

## 2016-06-21 DIAGNOSIS — Y999 Unspecified external cause status: Secondary | ICD-10-CM | POA: Diagnosis not present

## 2016-06-21 DIAGNOSIS — I10 Essential (primary) hypertension: Secondary | ICD-10-CM | POA: Insufficient documentation

## 2016-06-21 DIAGNOSIS — Y939 Activity, unspecified: Secondary | ICD-10-CM | POA: Diagnosis not present

## 2016-06-21 DIAGNOSIS — Z79899 Other long term (current) drug therapy: Secondary | ICD-10-CM | POA: Diagnosis not present

## 2016-06-21 DIAGNOSIS — S0083XA Contusion of other part of head, initial encounter: Secondary | ICD-10-CM | POA: Diagnosis not present

## 2016-06-21 DIAGNOSIS — S0003XA Contusion of scalp, initial encounter: Secondary | ICD-10-CM | POA: Diagnosis not present

## 2016-06-21 DIAGNOSIS — H578 Other specified disorders of eye and adnexa: Secondary | ICD-10-CM | POA: Diagnosis not present

## 2016-06-21 DIAGNOSIS — S0993XA Unspecified injury of face, initial encounter: Secondary | ICD-10-CM | POA: Diagnosis present

## 2016-06-21 DIAGNOSIS — S0590XA Unspecified injury of unspecified eye and orbit, initial encounter: Secondary | ICD-10-CM | POA: Diagnosis not present
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
## 2016-06-21 NOTE — ED Provider Notes (Signed)
[facility] DEPT Provider Note   CSN: [id number] Arrival date & time: 06/21/16  1017     History   Chief Complaint Chief Complaint  Patient presents with  . Eye Pain     Level V caveat: Dementia   HPI Deanna [name] is a 71 y.o. female.  HPI Patient presents to the emergency department with complaints of discoloration noted around her right eye by the nursing staff at her facility.  Patient has dementia and is unable to provide any additional information.  As reported that the patient's had this discoloration on her right eyelid over the past 24 hours.  No known fall or injury.  No discharge noted coming from the hyper nursing staff.  [facility] emergency department for evaluation   Past Medical History:  Diagnosis Date  . Dementia Dx 2015  . Hyperlipidemia DX 2015  . Hypertension Dx 2015    Patient Active Problem List   Diagnosis Date Noted  . Dementia with behavioral disturbance 08/04/2015  . Increased ammonia level 06/15/2014  . Left flank pain 06/15/2014  . Cognitive changes 03/24/2014  . Fatigue 03/24/2014  . Vitreous floaters of right eye 03/11/2014  . Underweight 03/11/2014  . HTN (hypertension) 12/16/2013  . HLD (hyperlipidemia) 12/16/2013  . Dementia 12/16/2013    Past Surgical History:  Procedure Laterality Date  . [place]     OB History    No data available       Home Medications    Prior to Admission medications   Medication Sig Start Date End Date Taking? Authorizing Provider  buPROPion (WELLBUTRIN XL) 150 MG 24 hr tablet Take 1 tablet (150 mg total) by mouth daily. 10/19/15 10/18/16  Brayton Caves, PA-C  doxycycline (VIBRAMYCIN) 100 MG capsule Take 1 capsule (100 mg total) by mouth 2 (two) times daily. 06/06/16   Dorie Rank, MD  LORazepam (ATIVAN) 0.5 MG tablet Take 0.5 mg by mouth every 12 (twelve) hours as needed for anxiety (and/or insomnia).    Historical Provider, MD  mirtazapine (REMERON) 15 MG tablet Take 15 mg by  mouth at bedtime.    Historical Provider, MD  Multiple Vitamin (THERA) TABS Take 1 tablet by mouth daily.     Historical Provider, MD  OLANZapine (ZYPREXA) 5 MG tablet Take 1 tablet (5 mg total) by mouth at bedtime. 11/03/15   Josalyn Funches, MD  polyethylene glycol (MIRALAX / GLYCOLAX) packet Take 17 g by mouth daily as needed for mild constipation.    Historical Provider, MD  senna-docusate (SENOKOT-S) 8.6-50 MG tablet Take 1 tablet by mouth 2 (two) times daily.    Historical Provider, MD  sertraline (ZOLOFT) 100 MG tablet Take 1 tablet (100 mg total) by mouth daily. 10/19/15 10/18/16  Brayton Caves, PA-C    Family History Family History  Problem Relation Age of Onset  . Cancer Neg Hx   . Heart disease Neg Hx   . Dementia Neg Hx     Social History Social History  Substance Use Topics  . Smoking status: Never Smoker  . Smokeless tobacco: Never Used  . Alcohol use No     Allergies   Patient has no known allergies.   Review of Systems Review of Systems  Unable to perform ROS: Dementia     Physical Exam Updated Vital Signs BP (!) 171/88   Pulse 83   Temp 98.9 F (37.2 C) (Oral)   Resp 16   Ht 5\' 5"  (1.651 m)   Wt 120  lb (54.4 kg)   SpO2 98%   BMI 19.97 kg/m   Physical Exam  Constitutional: She appears well-developed and well-nourished. No distress.  HENT:  Small amount of right periorbital ecchymosis is noted without lacerations.  No significant swelling.  Extraocular movements are intact.  Pupils in the right is normal.  Eyes: EOM are normal.  Neck: Normal range of motion.  Cardiovascular: Normal rate, regular rhythm and normal heart sounds.   Pulmonary/Chest: Effort normal and breath sounds normal.  Abdominal: Soft. She exhibits no distension. There is no tenderness.  Musculoskeletal: Normal range of motion.  Neurological: She is alert.  Full range of motion bilateral hips, knees, ankles.  Full range of motion bilateral shoulders, elbows, wrists.   Skin:  Skin is warm and dry.  Psychiatric: She has a normal mood and affect. Judgment normal.  Nursing note and vitals reviewed.    ED Treatments / Results  Labs (all labs ordered are listed, but only abnormal results are displayed) Labs Reviewed - No data to display  EKG  EKG Interpretation None       Radiology No results found.  Procedures Procedures (including critical care time)  Medications Ordered in ED Medications - No data to display   Initial Impression / Assessment and Plan / ED Course  I have reviewed the triage vital signs and the nursing notes.  Pertinent labs & imaging results that were available during my care of the patient were reviewed by me and considered in my medical decision making (see chart for details).     Very mild periorbital ecchymosis.  Doubt significant underlying fracture that would require surgical management.  No ocular entrapment noted.  I do not believe the patient is acute imaging.  She is sitting there eating a sandwich.  Discharge home in good condition.  Final Clinical Impressions(s) / ED Diagnoses   Final diagnoses:  Facial bruising, initial encounter    New Prescriptions New Prescriptions   No medications on file     Jola Schmidt, MD 06/21/16 1155

## 2016-06-21 NOTE — ED Notes (Signed)
Notified Network engineer to call [facility] for transport to [facility].

## 2016-06-21 NOTE — ED Triage Notes (Signed)
Pt arrives from [facility] via EMS c/o R facial pain.  EMS reports redness to R eyelid and lower eye noted yesterday at 0800.  Pt AOx1 at baseline, no known fall or injury.  Pupils equal and reactive, NAD noted at this time.

## 2016-06-21 NOTE — ED Notes (Signed)
Attempted to call report to [facility]
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
## 2016-06-26 DIAGNOSIS — R634 Abnormal weight loss: Secondary | ICD-10-CM | POA: Diagnosis not present

## 2016-06-26 DIAGNOSIS — F0391 Unspecified dementia with behavioral disturbance: Secondary | ICD-10-CM | POA: Diagnosis not present

## 2016-06-26 DIAGNOSIS — S0510XA Contusion of eyeball and orbital tissues, unspecified eye, initial encounter: Secondary | ICD-10-CM | POA: Diagnosis not present

## 2016-06-26 DIAGNOSIS — E785 Hyperlipidemia, unspecified: Secondary | ICD-10-CM | POA: Diagnosis not present

## 2016-06-26 DIAGNOSIS — R54 Age-related physical debility: Secondary | ICD-10-CM | POA: Diagnosis not present

## 2016-06-26 DIAGNOSIS — I10 Essential (primary) hypertension: Secondary | ICD-10-CM | POA: Diagnosis not present

## 2016-06-26 DIAGNOSIS — L723 Sebaceous cyst: Secondary | ICD-10-CM | POA: Diagnosis not present

## 2016-06-26 DIAGNOSIS — G4700 Insomnia, unspecified: Secondary | ICD-10-CM | POA: Diagnosis not present

## 2016-07-17 DIAGNOSIS — I10 Essential (primary) hypertension: Secondary | ICD-10-CM | POA: Diagnosis not present

## 2016-07-17 DIAGNOSIS — S0510XA Contusion of eyeball and orbital tissues, unspecified eye, initial encounter: Secondary | ICD-10-CM | POA: Diagnosis not present

## 2016-07-17 DIAGNOSIS — F329 Major depressive disorder, single episode, unspecified: Secondary | ICD-10-CM | POA: Diagnosis not present

## 2016-07-17 DIAGNOSIS — E559 Vitamin D deficiency, unspecified: Secondary | ICD-10-CM | POA: Diagnosis not present

## 2016-07-17 DIAGNOSIS — E785 Hyperlipidemia, unspecified: Secondary | ICD-10-CM | POA: Diagnosis not present

## 2016-07-17 DIAGNOSIS — L723 Sebaceous cyst: Secondary | ICD-10-CM | POA: Diagnosis not present

## 2016-07-17 DIAGNOSIS — G4700 Insomnia, unspecified: Secondary | ICD-10-CM | POA: Diagnosis not present

## 2016-07-17 DIAGNOSIS — F0391 Unspecified dementia with behavioral disturbance: Secondary | ICD-10-CM | POA: Diagnosis not present

## 2016-07-18 DIAGNOSIS — Z79899 Other long term (current) drug therapy: Secondary | ICD-10-CM | POA: Diagnosis not present

## 2016-08-21 DIAGNOSIS — G4700 Insomnia, unspecified: Secondary | ICD-10-CM | POA: Diagnosis not present

## 2016-08-21 DIAGNOSIS — I10 Essential (primary) hypertension: Secondary | ICD-10-CM | POA: Diagnosis not present

## 2016-08-21 DIAGNOSIS — F0280 Dementia in other diseases classified elsewhere without behavioral disturbance: Secondary | ICD-10-CM | POA: Diagnosis not present

## 2016-08-21 DIAGNOSIS — R634 Abnormal weight loss: Secondary | ICD-10-CM | POA: Diagnosis not present

## 2016-08-21 DIAGNOSIS — L723 Sebaceous cyst: Secondary | ICD-10-CM | POA: Diagnosis not present

## 2016-08-21 DIAGNOSIS — E785 Hyperlipidemia, unspecified: Secondary | ICD-10-CM | POA: Diagnosis not present

## 2016-08-21 DIAGNOSIS — F323 Major depressive disorder, single episode, severe with psychotic features: Secondary | ICD-10-CM | POA: Diagnosis not present

## 2016-08-21 DIAGNOSIS — S0510XA Contusion of eyeball and orbital tissues, unspecified eye, initial encounter: Secondary | ICD-10-CM | POA: Diagnosis not present

## 2016-09-01 ENCOUNTER — Encounter (HOSPITAL_COMMUNITY): Payer: Self-pay | Admitting: Emergency Medicine

## 2016-09-01 ENCOUNTER — Emergency Department (HOSPITAL_COMMUNITY)
Admission: EM | Admit: 2016-09-01 | Discharge: 2016-09-01 | Disposition: A | Discharge status: Skilled Nursing Facility | Payer: Medicare Other | Attending: Emergency Medicine | Admitting: Emergency Medicine

## 2016-09-01 ENCOUNTER — Emergency Department (HOSPITAL_COMMUNITY): Payer: Medicare Other

## 2016-09-01 DIAGNOSIS — R4182 Altered mental status, unspecified: Secondary | ICD-10-CM | POA: Diagnosis not present

## 2016-09-01 DIAGNOSIS — F0391 Unspecified dementia with behavioral disturbance: Secondary | ICD-10-CM | POA: Insufficient documentation

## 2016-09-01 DIAGNOSIS — I10 Essential (primary) hypertension: Secondary | ICD-10-CM | POA: Diagnosis not present

## 2016-09-01 DIAGNOSIS — S01111A Laceration without foreign body of right eyelid and periocular area, initial encounter: Secondary | ICD-10-CM | POA: Diagnosis not present

## 2016-09-01 DIAGNOSIS — S0990XA Unspecified injury of head, initial encounter: Secondary | ICD-10-CM | POA: Diagnosis not present

## 2016-09-01 DIAGNOSIS — Y939 Activity, unspecified: Secondary | ICD-10-CM | POA: Insufficient documentation

## 2016-09-01 DIAGNOSIS — Y999 Unspecified external cause status: Secondary | ICD-10-CM | POA: Diagnosis not present

## 2016-09-01 DIAGNOSIS — Z79899 Other long term (current) drug therapy: Secondary | ICD-10-CM | POA: Insufficient documentation

## 2016-09-01 DIAGNOSIS — W010XXA Fall on same level from slipping, tripping and stumbling without subsequent striking against object, initial encounter: Secondary | ICD-10-CM | POA: Insufficient documentation

## 2016-09-01 DIAGNOSIS — S0191XA Laceration without foreign body of unspecified part of head, initial encounter: Secondary | ICD-10-CM | POA: Diagnosis not present

## 2016-09-01 DIAGNOSIS — Z23 Encounter for immunization: Secondary | ICD-10-CM | POA: Diagnosis not present

## 2016-09-01 DIAGNOSIS — Y92122 Bedroom in nursing home as the place of occurrence of the external cause: Secondary | ICD-10-CM | POA: Insufficient documentation

## 2016-09-01 DIAGNOSIS — S0181XA Laceration without foreign body of other part of head, initial encounter: Secondary | ICD-10-CM

## 2016-09-01 DIAGNOSIS — S0010XA Contusion of unspecified eyelid and periocular area, initial encounter: Secondary | ICD-10-CM | POA: Diagnosis not present

## 2016-09-01 DIAGNOSIS — W19XXXA Unspecified fall, initial encounter: Secondary | ICD-10-CM

## 2016-09-01 DIAGNOSIS — S01121A Laceration with foreign body of right eyelid and periocular area, initial encounter: Secondary | ICD-10-CM | POA: Diagnosis not present

## 2016-09-01 MED ORDER — TETANUS-DIPHTH-ACELL PERTUSSIS 5-2.5-18.5 LF-MCG/0.5 IM SUSP
0.5000 mL | Freq: Once | INTRAMUSCULAR | Status: AC
  Administered 2016-09-01: 0.5 mL via INTRAMUSCULAR
  Filled 2016-09-01: qty 0.5

## 2016-09-01 MED ORDER — BACITRACIN ZINC 500 UNIT/GM EX OINT
1.0000 "application " | TOPICAL_OINTMENT | Freq: Two times a day (BID) | CUTANEOUS | Status: DC
  Administered 2016-09-01: 1 via TOPICAL

## 2016-09-01 MED ORDER — LIDOCAINE-EPINEPHRINE (PF) 2 %-1:200000 IJ SOLN
10.0000 mL | Freq: Once | INTRAMUSCULAR | Status: AC
  Administered 2016-09-01: 10 mL
  Filled 2016-09-01: qty 20

## 2016-09-01 NOTE — ED Triage Notes (Signed)
Per EMS: pt here from [facility] c/o fall x 2 today; with with bandaid to forehead and small hematoma noted; pt acting per norm at present with hx of dementia

## 2016-09-01 NOTE — ED Notes (Signed)
[facility] contacted to transport patient to [facility]

## 2016-09-01 NOTE — ED Notes (Signed)
Report called to [facility]

## 2016-09-01 NOTE — Discharge Instructions (Signed)
Keep wound clean using Dial anti-care of soap and water, and gently pat dry. You may apply a small amount of Neosporin antibiotic ointment to wound once daily. Continue taking your home medications as prescribed. Follow-up with your primary care provider within the next week for follow-up evaluation. Return to the ED or be seen by her primary care provider in 7 days for suture removal. Return to the emergency department if symptoms worsen or new onset of altered mental status, confusion, headache, dizziness, syncope, chest pain, shortness of breath, abdominal pain, vomiting, numbness, weakness, seizure.

## 2016-09-01 NOTE — ED Provider Notes (Signed)
[place] DEPT Provider Note   CSN: [id number] Arrival date & time: 09/01/16  6256     History   Chief Complaint Chief Complaint  Patient presents with  . Fall    HPI Deanna [name] is a 71 y.o. female.  HPI   Patient is a 71 year old female with history of hypertension, hyperlipidemia, dementia who presents via EMS from [facility] with complaint of 2 unwitnessed falls. Patient was found by nursing staff this morning in her room and noted to have a laceration to her right eyebrow. I spoke with nursing staff who reports she initially found the patient laying on the ground in her bedroom around 1:30 AM. She notes she called out EMS who came and evaluated the patient when she was noted to have a small cut above her right eyebrow, patient was able to ambulate with assistance and was advised she did not need evaluation in the ED. Nursing staff then reports she found the patient on the ground in her room after eating breakfast this morning. Nursing staff reports the patient is currently at baseline mental status. Denies any new symptoms including fever, headache, cough, shortness of breath, chest pain, abdominal pain, nausea, vomiting, diarrhea, urinary symptoms, rash, weakness. Denies use of anticoagulants. Patient denies any pain or complaints. Tetanus status unknown.  LEVEL V CAVEAT- Dementia  Past Medical History:  Diagnosis Date  . Dementia Dx 2015  . Hyperlipidemia DX 2015  . Hypertension Dx 2015    Patient Active Problem List   Diagnosis Date Noted  . Dementia with behavioral disturbance 08/04/2015  . Increased ammonia level 06/15/2014  . Left flank pain 06/15/2014  . Cognitive changes 03/24/2014  . Fatigue 03/24/2014  . Vitreous floaters of right eye 03/11/2014  . Underweight 03/11/2014  . HTN (hypertension) 12/16/2013  . HLD (hyperlipidemia) 12/16/2013  . Dementia 12/16/2013    Past Surgical History:  Procedure Laterality Date  . [place]       OB History    No data available       Home Medications    Prior to Admission medications   Medication Sig Start Date End Date Taking? Authorizing Provider  buPROPion (WELLBUTRIN XL) 150 MG 24 hr tablet Take 1 tablet (150 mg total) by mouth daily. 10/19/15 10/18/16 Yes Noel, Tiffany S, PA-C  LORazepam (ATIVAN) 0.5 MG tablet Take 0.5 mg by mouth every 12 (twelve) hours as needed for anxiety (and/or insomnia).   Yes [provider]  mirtazapine (REMERON) 15 MG tablet Take 15 mg by mouth at bedtime.   Yes [provider]  Multiple Vitamin (THERA) TABS Take 1 tablet by mouth daily.    Yes [provider]  OLANZapine (ZYPREXA) 5 MG tablet Take 1 tablet (5 mg total) by mouth at bedtime. 11/03/15  Yes Funches, Josalyn, MD  polyethylene glycol (MIRALAX / GLYCOLAX) packet Take 17 g by mouth daily as needed for mild constipation.   Yes [provider]  senna-docusate (SENOKOT-S) 8.6-50 MG tablet Take 1 tablet by mouth 2 (two) times daily.   Yes [provider]  sertraline (ZOLOFT) 100 MG tablet Take 1 tablet (100 mg total) by mouth daily. 10/19/15 10/18/16 Yes Brayton Caves, PA-C    Family History Family History  Problem Relation Age of Onset  . Cancer Neg Hx   . Heart disease Neg Hx   . Dementia Neg Hx     Social History Social History  Substance Use Topics  . Smoking status: Never Smoker  .  Smokeless tobacco: Never Used  . Alcohol use No     Allergies   Patient has no known allergies.   Review of Systems Review of Systems  Unable to perform ROS: Dementia  Skin: Positive for wound.  All other systems reviewed and are negative.    Physical Exam Updated Vital Signs BP 140/84   Pulse 78   Temp 97 F (36.1 C) (Rectal)   Resp 15   SpO2 100%   Physical Exam  Constitutional: She appears well-developed and well-nourished. No distress.  HENT:  Head: Normocephalic. Head is with laceration. Head is without raccoon's eyes, without  Battle's sign, without abrasion and without contusion.    Right Ear: Tympanic membrane normal. No hemotympanum.  Left Ear: Tympanic membrane normal. No hemotympanum.  Nose: Nose normal. No sinus tenderness, nasal deformity, septal deviation or nasal septal hematoma. No epistaxis.  Mouth/Throat: Uvula is midline, oropharynx is clear and moist and mucous membranes are normal. No oropharyngeal exudate, posterior oropharyngeal edema, posterior oropharyngeal erythema or tonsillar abscesses.  1cm laceration present to right eyebrow, no active bleeding.  Eyes: Conjunctivae and EOM are normal. Pupils are equal, round, and reactive to light. Right eye exhibits no discharge. Left eye exhibits no discharge. No scleral icterus.  Neck: Normal range of motion. Neck supple.  Cardiovascular: Normal rate, regular rhythm, normal heart sounds and intact distal pulses.   Pulmonary/Chest: Effort normal and breath sounds normal. No respiratory distress. She has no wheezes. She has no rales. She exhibits no tenderness.  Abdominal: Soft. Bowel sounds are normal. She exhibits no distension and no mass. There is no tenderness. There is no rebound and no guarding. No hernia.  Musculoskeletal: Normal range of motion. She exhibits no edema or tenderness.  No cervical, thoracic, or lumbar spine midline TTP.  Full ROM of bilateral upper and lower extremities with 5/5 strength.   2+ radial and PT pulses. Sensation grossly intact.   Neurological: She is alert. She has normal strength. No cranial nerve deficit or sensory deficit. She displays a negative Romberg sign. Coordination normal.  Pt alert and oriented to person only.   Skin: Skin is warm and dry. She is not diaphoretic.  Nursing note and vitals reviewed.    ED Treatments / Results  Labs (all labs ordered are listed, but only abnormal results are displayed) Labs Reviewed - No data to display  EKG  EKG Interpretation None       Radiology Ct Head Wo  Contrast  Result Date: 09/01/2016 CLINICAL DATA:  Unwitnessed fall today.  Head laceration.  Dementia. EXAM: CT HEAD WITHOUT CONTRAST TECHNIQUE: Contiguous axial images were obtained from the base of the skull through the vertex without intravenous contrast. COMPARISON:  None FINDINGS: Brain: No evidence of acute infarction, hemorrhage, hydrocephalus, extra-axial collection or mass lesion/mass effect. Old lacunar infarcts are seen involving the left caudate nucleus and internal capsule in left cerebellum. Mild chronic small vessel disease also noted. Vascular: No hyperdense vessel or unexpected calcification. Skull: Normal. Negative for fracture or focal lesion. Sinuses/Orbits: No acute finding. Other: Small right frontal scalp hematoma. IMPRESSION: No acute intracranial abnormality. Old left basal ganglia and cerebellar lacunar infarcts. Mild chronic small vessel disease. Electronically Signed   By: Earle Gell M.D.   On: 09/01/2016 10:15    Procedures .Marland KitchenLaceration Repair Date/Time: 09/01/2016 11:47 AM Performed by: Nona Dell Authorized by: Nona Dell   Consent:    Consent obtained:  Verbal   Consent given by:  Patient Anesthesia (see  MAR for exact dosages):    Anesthesia method:  Local infiltration   Local anesthetic:  Lidocaine 2% WITH epi Laceration details:    Location:  Face   Face location:  R eyebrow   Length (cm):  1 Repair type:    Repair type:  Simple Pre-procedure details:    Preparation:  Patient was prepped and draped in usual sterile fashion and imaging obtained to evaluate for foreign bodies Exploration:    Wound exploration: wound explored through full range of motion and entire depth of wound probed and visualized     Wound extent: no foreign bodies/material noted, no muscle damage noted, no nerve damage noted, no tendon damage noted, no underlying fracture noted and no vascular damage noted     Contaminated: no   Treatment:    Area cleansed  with:  Betadine and saline   Amount of cleaning:  Standard   Irrigation solution:  Sterile water   Irrigation method:  Syringe   Visualized foreign bodies/material removed: no   Skin repair:    Repair method:  Sutures   Suture size:  6-0   Suture material:  Prolene   Suture technique:  Simple interrupted   Number of sutures:  3 Approximation:    Approximation:  Close   Vermilion border: well-aligned   Post-procedure details:    Dressing:  Antibiotic ointment   Patient tolerance of procedure:  Tolerated well, no immediate complications    (including critical care time)  Medications Ordered in ED Medications  bacitracin ointment 1 application (not administered)  lidocaine-EPINEPHrine (XYLOCAINE W/EPI) 2 %-1:200000 (PF) injection 10 mL (10 mLs Infiltration Given 09/01/16 1046)  Tdap (BOOSTRIX) injection 0.5 mL (0.5 mLs Intramuscular Given 09/01/16 1046)     Initial Impression / Assessment and Plan / ED Course  I have reviewed the triage vital signs and the nursing notes.  Pertinent labs & imaging results that were available during my care of the patient were reviewed by me and considered in my medical decision making (see chart for details).     Patient presents with unwitnessed fall from nursing facility, she was found laying on the ground in her room. Nursing staff report patient being at baseline mental status, denies pt have any recent sxs or complaints over the past week. Denies use of anticoagulants. Patient denies any pain or complaints upon arrival to the ED. PMH of hypertension, hyperlipidemia, dementia. VSS. Exam revealed laceration to right eyebrow, no active bleeding. No other evidence of head injury. No neuro deficits, A&Ox1. Pt moving all 4 extremities spontaneously, no evidence of injury. CT head negative. Tetanus updated in the ED. Pressure irrigation performed. Wound explored and base of wound visualized in a bloodless field without evidence of foreign body.  Lc repair  completed without any complications.  Pt able to ambulate without assistance. Discussed pt with Dr. Wilson Singer and plan for d/c. Pt d/c back to facility with wound care instructions and advised to return in 7 days for suture removal. Discussed return precautions.   Final Clinical Impressions(s) / ED Diagnoses   Final diagnoses:  Fall, initial encounter  Laceration of other part of head without foreign body, initial encounter    New Prescriptions New Prescriptions   No medications on file     Nona Dell, Hershal Coria 09/01/16 1330    Virgel Manifold, MD 09/09/16 1731

## 2016-09-04 DIAGNOSIS — F0281 Dementia in other diseases classified elsewhere with behavioral disturbance: Secondary | ICD-10-CM | POA: Diagnosis not present

## 2016-09-04 DIAGNOSIS — E785 Hyperlipidemia, unspecified: Secondary | ICD-10-CM | POA: Diagnosis not present

## 2016-09-04 DIAGNOSIS — E559 Vitamin D deficiency, unspecified: Secondary | ICD-10-CM | POA: Diagnosis not present

## 2016-09-04 DIAGNOSIS — S0510XA Contusion of eyeball and orbital tissues, unspecified eye, initial encounter: Secondary | ICD-10-CM | POA: Diagnosis not present

## 2016-09-04 DIAGNOSIS — R634 Abnormal weight loss: Secondary | ICD-10-CM | POA: Diagnosis not present

## 2016-09-04 DIAGNOSIS — I10 Essential (primary) hypertension: Secondary | ICD-10-CM | POA: Diagnosis not present

## 2016-09-04 DIAGNOSIS — L723 Sebaceous cyst: Secondary | ICD-10-CM | POA: Diagnosis not present

## 2016-09-04 DIAGNOSIS — G4700 Insomnia, unspecified: Secondary | ICD-10-CM | POA: Diagnosis not present

## 2016-09-11 DIAGNOSIS — E785 Hyperlipidemia, unspecified: Secondary | ICD-10-CM | POA: Diagnosis not present

## 2016-09-11 DIAGNOSIS — L723 Sebaceous cyst: Secondary | ICD-10-CM | POA: Diagnosis not present

## 2016-09-11 DIAGNOSIS — G4700 Insomnia, unspecified: Secondary | ICD-10-CM | POA: Diagnosis not present

## 2016-09-11 DIAGNOSIS — E559 Vitamin D deficiency, unspecified: Secondary | ICD-10-CM | POA: Diagnosis not present

## 2016-09-11 DIAGNOSIS — R634 Abnormal weight loss: Secondary | ICD-10-CM | POA: Diagnosis not present

## 2016-09-11 DIAGNOSIS — S0510XA Contusion of eyeball and orbital tissues, unspecified eye, initial encounter: Secondary | ICD-10-CM | POA: Diagnosis not present

## 2016-09-11 DIAGNOSIS — R269 Unspecified abnormalities of gait and mobility: Secondary | ICD-10-CM | POA: Diagnosis not present

## 2016-09-11 DIAGNOSIS — I10 Essential (primary) hypertension: Secondary | ICD-10-CM | POA: Diagnosis not present

## 2016-10-09 DIAGNOSIS — L723 Sebaceous cyst: Secondary | ICD-10-CM | POA: Diagnosis not present

## 2016-10-09 DIAGNOSIS — G4700 Insomnia, unspecified: Secondary | ICD-10-CM | POA: Diagnosis not present

## 2016-10-09 DIAGNOSIS — R64 Cachexia: Secondary | ICD-10-CM | POA: Diagnosis not present

## 2016-10-09 DIAGNOSIS — E785 Hyperlipidemia, unspecified: Secondary | ICD-10-CM | POA: Diagnosis not present

## 2016-10-09 DIAGNOSIS — K5900 Constipation, unspecified: Secondary | ICD-10-CM | POA: Diagnosis not present

## 2016-10-09 DIAGNOSIS — S0510XA Contusion of eyeball and orbital tissues, unspecified eye, initial encounter: Secondary | ICD-10-CM | POA: Diagnosis not present

## 2016-10-09 DIAGNOSIS — R634 Abnormal weight loss: Secondary | ICD-10-CM | POA: Diagnosis not present

## 2016-10-09 DIAGNOSIS — F0391 Unspecified dementia with behavioral disturbance: Secondary | ICD-10-CM | POA: Diagnosis not present

## 2016-11-06 DIAGNOSIS — G4700 Insomnia, unspecified: Secondary | ICD-10-CM | POA: Diagnosis not present

## 2016-11-06 DIAGNOSIS — L723 Sebaceous cyst: Secondary | ICD-10-CM | POA: Diagnosis not present

## 2016-11-06 DIAGNOSIS — I10 Essential (primary) hypertension: Secondary | ICD-10-CM | POA: Diagnosis not present

## 2016-11-06 DIAGNOSIS — S0510XA Contusion of eyeball and orbital tissues, unspecified eye, initial encounter: Secondary | ICD-10-CM | POA: Diagnosis not present

## 2016-11-06 DIAGNOSIS — E785 Hyperlipidemia, unspecified: Secondary | ICD-10-CM | POA: Diagnosis not present

## 2016-11-06 DIAGNOSIS — R269 Unspecified abnormalities of gait and mobility: Secondary | ICD-10-CM | POA: Diagnosis not present

## 2016-11-06 DIAGNOSIS — F0391 Unspecified dementia with behavioral disturbance: Secondary | ICD-10-CM | POA: Diagnosis not present

## 2016-11-06 DIAGNOSIS — R634 Abnormal weight loss: Secondary | ICD-10-CM | POA: Diagnosis not present

## 2016-11-13 DIAGNOSIS — I10 Essential (primary) hypertension: Secondary | ICD-10-CM | POA: Diagnosis not present

## 2016-11-13 DIAGNOSIS — G4700 Insomnia, unspecified: Secondary | ICD-10-CM | POA: Diagnosis not present

## 2016-11-13 DIAGNOSIS — L723 Sebaceous cyst: Secondary | ICD-10-CM | POA: Diagnosis not present

## 2016-11-13 DIAGNOSIS — E739 Lactose intolerance, unspecified: Secondary | ICD-10-CM | POA: Diagnosis not present

## 2016-11-13 DIAGNOSIS — R634 Abnormal weight loss: Secondary | ICD-10-CM | POA: Diagnosis not present

## 2016-11-13 DIAGNOSIS — G309 Alzheimer's disease, unspecified: Secondary | ICD-10-CM | POA: Diagnosis not present

## 2016-11-13 DIAGNOSIS — E785 Hyperlipidemia, unspecified: Secondary | ICD-10-CM | POA: Diagnosis not present

## 2016-11-13 DIAGNOSIS — R269 Unspecified abnormalities of gait and mobility: Secondary | ICD-10-CM | POA: Diagnosis not present

## 2016-11-20 DIAGNOSIS — E784 Other hyperlipidemia: Secondary | ICD-10-CM | POA: Diagnosis not present

## 2016-11-20 DIAGNOSIS — Z79899 Other long term (current) drug therapy: Secondary | ICD-10-CM | POA: Diagnosis not present

## 2016-11-27 DIAGNOSIS — E739 Lactose intolerance, unspecified: Secondary | ICD-10-CM | POA: Diagnosis not present

## 2016-11-27 DIAGNOSIS — E785 Hyperlipidemia, unspecified: Secondary | ICD-10-CM | POA: Diagnosis not present

## 2016-11-27 DIAGNOSIS — I10 Essential (primary) hypertension: Secondary | ICD-10-CM | POA: Diagnosis not present

## 2016-11-27 DIAGNOSIS — F329 Major depressive disorder, single episode, unspecified: Secondary | ICD-10-CM | POA: Diagnosis not present

## 2016-11-27 DIAGNOSIS — R269 Unspecified abnormalities of gait and mobility: Secondary | ICD-10-CM | POA: Diagnosis not present

## 2016-11-27 DIAGNOSIS — Z79899 Other long term (current) drug therapy: Secondary | ICD-10-CM | POA: Diagnosis not present

## 2016-11-27 DIAGNOSIS — L723 Sebaceous cyst: Secondary | ICD-10-CM | POA: Diagnosis not present

## 2016-11-27 DIAGNOSIS — G4700 Insomnia, unspecified: Secondary | ICD-10-CM | POA: Diagnosis not present

## 2016-12-11 DIAGNOSIS — F329 Major depressive disorder, single episode, unspecified: Secondary | ICD-10-CM | POA: Diagnosis not present

## 2016-12-11 DIAGNOSIS — L723 Sebaceous cyst: Secondary | ICD-10-CM | POA: Diagnosis not present

## 2016-12-11 DIAGNOSIS — E739 Lactose intolerance, unspecified: Secondary | ICD-10-CM | POA: Diagnosis not present

## 2016-12-11 DIAGNOSIS — S0510XA Contusion of eyeball and orbital tissues, unspecified eye, initial encounter: Secondary | ICD-10-CM | POA: Diagnosis not present

## 2016-12-11 DIAGNOSIS — R269 Unspecified abnormalities of gait and mobility: Secondary | ICD-10-CM | POA: Diagnosis not present

## 2016-12-11 DIAGNOSIS — R634 Abnormal weight loss: Secondary | ICD-10-CM | POA: Diagnosis not present

## 2016-12-11 DIAGNOSIS — G4700 Insomnia, unspecified: Secondary | ICD-10-CM | POA: Diagnosis not present

## 2016-12-11 DIAGNOSIS — I10 Essential (primary) hypertension: Secondary | ICD-10-CM | POA: Diagnosis not present

## 2016-12-11 DIAGNOSIS — E785 Hyperlipidemia, unspecified: Secondary | ICD-10-CM | POA: Diagnosis not present

## 2017-01-08 DIAGNOSIS — F0391 Unspecified dementia with behavioral disturbance: Secondary | ICD-10-CM | POA: Diagnosis not present

## 2017-01-08 DIAGNOSIS — R54 Age-related physical debility: Secondary | ICD-10-CM | POA: Diagnosis not present

## 2017-01-08 DIAGNOSIS — E739 Lactose intolerance, unspecified: Secondary | ICD-10-CM | POA: Diagnosis not present

## 2017-01-08 DIAGNOSIS — K5900 Constipation, unspecified: Secondary | ICD-10-CM | POA: Diagnosis not present

## 2017-01-08 DIAGNOSIS — R634 Abnormal weight loss: Secondary | ICD-10-CM | POA: Diagnosis not present

## 2017-01-08 DIAGNOSIS — I10 Essential (primary) hypertension: Secondary | ICD-10-CM | POA: Diagnosis not present

## 2017-01-08 DIAGNOSIS — R269 Unspecified abnormalities of gait and mobility: Secondary | ICD-10-CM | POA: Diagnosis not present

## 2017-01-29 DIAGNOSIS — L723 Sebaceous cyst: Secondary | ICD-10-CM | POA: Diagnosis not present

## 2017-01-29 DIAGNOSIS — E739 Lactose intolerance, unspecified: Secondary | ICD-10-CM | POA: Diagnosis not present

## 2017-01-29 DIAGNOSIS — R634 Abnormal weight loss: Secondary | ICD-10-CM | POA: Diagnosis not present

## 2017-01-29 DIAGNOSIS — F339 Major depressive disorder, recurrent, unspecified: Secondary | ICD-10-CM | POA: Diagnosis not present

## 2017-01-29 DIAGNOSIS — R269 Unspecified abnormalities of gait and mobility: Secondary | ICD-10-CM | POA: Diagnosis not present

## 2017-01-29 DIAGNOSIS — I10 Essential (primary) hypertension: Secondary | ICD-10-CM | POA: Diagnosis not present

## 2017-01-29 DIAGNOSIS — R5381 Other malaise: Secondary | ICD-10-CM | POA: Diagnosis not present

## 2017-01-29 DIAGNOSIS — E785 Hyperlipidemia, unspecified: Secondary | ICD-10-CM | POA: Diagnosis not present

## 2017-02-13 DIAGNOSIS — L603 Nail dystrophy: Secondary | ICD-10-CM | POA: Diagnosis not present

## 2017-02-13 DIAGNOSIS — M79673 Pain in unspecified foot: Secondary | ICD-10-CM | POA: Diagnosis not present

## 2017-02-13 DIAGNOSIS — R269 Unspecified abnormalities of gait and mobility: Secondary | ICD-10-CM | POA: Diagnosis not present

## 2017-02-13 DIAGNOSIS — G300 Alzheimer's disease with early onset: Secondary | ICD-10-CM | POA: Diagnosis not present

## 2017-02-27 DIAGNOSIS — R64 Cachexia: Secondary | ICD-10-CM | POA: Diagnosis not present

## 2017-02-27 DIAGNOSIS — L723 Sebaceous cyst: Secondary | ICD-10-CM | POA: Diagnosis not present

## 2017-02-27 DIAGNOSIS — R634 Abnormal weight loss: Secondary | ICD-10-CM | POA: Diagnosis not present

## 2017-06-15 ENCOUNTER — Emergency Department (HOSPITAL_COMMUNITY): Payer: Medicare Other

## 2017-06-15 ENCOUNTER — Other Ambulatory Visit: Payer: Self-pay

## 2017-06-15 ENCOUNTER — Emergency Department (HOSPITAL_COMMUNITY)
Admission: EM | Admit: 2017-06-15 | Discharge: 2017-06-15 | Disposition: A | Discharge status: Home / Self Care | Payer: Medicare Other | Attending: Physician Assistant | Admitting: Physician Assistant

## 2017-06-15 DIAGNOSIS — R404 Transient alteration of awareness: Secondary | ICD-10-CM | POA: Diagnosis not present

## 2017-06-15 DIAGNOSIS — E785 Hyperlipidemia, unspecified: Secondary | ICD-10-CM | POA: Diagnosis not present

## 2017-06-15 DIAGNOSIS — Z79899 Other long term (current) drug therapy: Secondary | ICD-10-CM | POA: Insufficient documentation

## 2017-06-15 DIAGNOSIS — R4182 Altered mental status, unspecified: Secondary | ICD-10-CM | POA: Diagnosis present

## 2017-06-15 DIAGNOSIS — I10 Essential (primary) hypertension: Secondary | ICD-10-CM | POA: Diagnosis not present

## 2017-06-15 LAB — URINALYSIS, ROUTINE W REFLEX MICROSCOPIC
Bilirubin Urine: NEGATIVE
Glucose, UA: NEGATIVE mg/dL
HGB URINE DIPSTICK: NEGATIVE
Ketones, ur: NEGATIVE mg/dL
Leukocytes, UA: NEGATIVE
Nitrite: NEGATIVE
Protein, ur: NEGATIVE mg/dL
SPECIFIC GRAVITY, URINE: 1.013 (ref 1.005–1.030)
pH: 7 (ref 5.0–8.0)

## 2017-06-15 LAB — CBC WITH DIFFERENTIAL/PLATELET
Basophils Absolute: 0 10*3/uL (ref 0.0–0.1)
Basophils Relative: 0 %
EOS ABS: 0.1 10*3/uL (ref 0.0–0.7)
Eosinophils Relative: 2 %
HEMATOCRIT: 41.9 % (ref 36.0–46.0)
HEMOGLOBIN: 13.1 g/dL (ref 12.0–15.0)
LYMPHS ABS: 1.8 10*3/uL (ref 0.7–4.0)
LYMPHS PCT: 26 %
MCH: 28.5 pg (ref 26.0–34.0)
MCHC: 31.3 g/dL (ref 30.0–36.0)
MCV: 91.3 fL (ref 78.0–100.0)
MONOS PCT: 8 %
Monocytes Absolute: 0.6 10*3/uL (ref 0.1–1.0)
NEUTROS ABS: 4.4 10*3/uL (ref 1.7–7.7)
NEUTROS PCT: 64 %
Platelets: 650 10*3/uL — ABNORMAL HIGH (ref 150–400)
RBC: 4.59 MIL/uL (ref 3.87–5.11)
RDW: 14.1 % (ref 11.5–15.5)
WBC: 6.8 10*3/uL (ref 4.0–10.5)

## 2017-06-15 LAB — BASIC METABOLIC PANEL
Anion gap: 7 (ref 5–15)
BUN: 15 mg/dL (ref 6–20)
CHLORIDE: 105 mmol/L (ref 101–111)
CO2: 31 mmol/L (ref 22–32)
Calcium: 9.1 mg/dL (ref 8.9–10.3)
Creatinine, Ser: 0.92 mg/dL (ref 0.44–1.00)
GFR calc non Af Amer: >60 mL/min (ref >60)
Glucose, Bld: 111 mg/dL — ABNORMAL HIGH (ref 65–99)
POTASSIUM: 3.9 mmol/L (ref 3.5–5.1)
SODIUM: 143 mmol/L (ref 135–145)

## 2017-06-15 NOTE — ED Notes (Signed)
[facility] CALL AWAITING ON THEM TO TRANSPORT PATIENT.

## 2017-06-15 NOTE — ED Notes (Signed)
Bed: WEMS01 Expected date:  Expected time:  Means of arrival:  Comments: 72 yo AMS, dementia

## 2017-06-15 NOTE — ED Triage Notes (Signed)
Prior to EMS arrival, pt had head down at breakfast table, not responding. After a few minutes, pt returned to baseline. Pt is normally not super talkative, but will track with eyes.

## 2017-06-15 NOTE — Discharge Instructions (Addendum)
Patient was sent here because she put her head down today at breakfast.  Everything is been very reassuring.  Her CAT scan urine chest x-ray labs have all been reassuring.  Her vital signs are good and patient appears to be very comfortable.  Will discharge home and have her follow-up with primary care return here as needed.

## 2017-06-15 NOTE — ED Provider Notes (Signed)
[facility] DEPT Provider Note   CSN: [id number] Arrival date & time: 06/15/17  2440     History   Chief Complaint Chief Complaint  Patient presents with  . Altered Mental Status    HPI Deanna [name] is a 72 y.o. female.  HPI  72 year old female with history of dementia presenting with "altered mental status".  Apparently patient was eating breakfast when she put her head on the table.  Patient was alert the whole time.  However this is different behavior than usual so she sent here to be evaluated.  Patient's baseline is single word responses.  Here in the ED she appears baseline.  Patient has her eyes closed resting, however will answer her name.  Patient's vital signs are reassuring.  Past Medical History:  Diagnosis Date  . Dementia Dx 2015  . Hyperlipidemia DX 2015  . Hypertension Dx 2015    Patient Active Problem List   Diagnosis Date Noted  . Dementia with behavioral disturbance 08/04/2015  . Increased ammonia level 06/15/2014  . Left flank pain 06/15/2014  . Cognitive changes 03/24/2014  . Fatigue 03/24/2014  . Vitreous floaters of right eye 03/11/2014  . Underweight 03/11/2014  . HTN (hypertension) 12/16/2013  . HLD (hyperlipidemia) 12/16/2013  . Dementia 12/16/2013    Past Surgical History:  Procedure Laterality Date  . [place]      OB History   None      Home Medications    Prior to Admission medications   Medication Sig Start Date End Date Taking? Authorizing Provider  buPROPion (WELLBUTRIN XL) 150 MG 24 hr tablet Take 1 tablet (150 mg total) by mouth daily. 10/19/15 06/15/17 Yes Noel, Tiffany S, PA-C  lactase (LACTAID) 3000 units tablet Take 9,000 Units by mouth 3 (three) times daily before meals.   Yes [provider]  mirtazapine (REMERON) 15 MG tablet Take 15 mg by mouth at bedtime.   Yes [provider]  Multiple Vitamin (THERA) TABS Take 1 tablet by mouth daily.    Yes  [provider]  OLANZapine (ZYPREXA) 5 MG tablet Take 1 tablet (5 mg total) by mouth at bedtime. 11/03/15  Yes Funches, Josalyn, MD  polyethylene glycol (MIRALAX / GLYCOLAX) packet Take 17 g by mouth daily as needed (constipation).   Yes [provider]  senna-docusate (SENOKOT-S) 8.6-50 MG tablet Take 1 tablet by mouth 2 (two) times daily.   Yes [provider]  sertraline (ZOLOFT) 100 MG tablet Take 1 tablet (100 mg total) by mouth daily. 10/19/15 06/15/17 Yes Brayton Caves, PA-C    Family History Family History  Problem Relation Age of Onset  . Cancer Neg Hx   . Heart disease Neg Hx   . Dementia Neg Hx     Social History Social History   Tobacco Use  . Smoking status: Never Smoker  . Smokeless tobacco: Never Used  Substance Use Topics  . Alcohol use: No  . Drug use: No     Allergies   Patient has no known allergies.   Review of Systems Review of Systems  Unable to perform ROS: Dementia  All other systems reviewed and are negative.    Physical Exam Updated Vital Signs BP (!) 148/80   Pulse 63   Temp 98.3 F (36.8 C) (Oral)   Resp 14   Ht 5\' 8"  (1.727 m)   Wt 54.4 kg (120 lb)   SpO2 99%   BMI 18.25 kg/m  Physical Exam  Constitutional: She appears well-developed and well-nourished.  HENT:  Head: Normocephalic and atraumatic.  Eyes: Right eye exhibits no discharge. Left eye exhibits no discharge.  Patient's eyes resting closed, when pried open patient has pupils that are responsive.  Cardiovascular: Normal rate and regular rhythm.  Pulmonary/Chest: Effort normal and breath sounds normal. No respiratory distress.  Abdominal: Soft. She exhibits no distension. There is no tenderness.  Neurological:  Increased resting tone, bilateral upper extremities.  Skin: Skin is warm and dry. She is not diaphoretic.  Psychiatric:  Apical to excess patient's behavioral given her advanced dementia.  Nursing note and vitals reviewed.    ED  Treatments / Results  Labs (all labs ordered are listed, but only abnormal results are displayed) Labs Reviewed  CBC WITH DIFFERENTIAL/PLATELET - Abnormal; Notable for the following components:      Result Value   Platelets 650 (*)    All other components within normal limits  BASIC METABOLIC PANEL - Abnormal; Notable for the following components:   Glucose, Bld 111 (*)    All other components within normal limits  URINALYSIS, ROUTINE W REFLEX MICROSCOPIC    EKG EKG Interpretation  Date/Time:  Saturday June 15 2017 09:11:18 EDT Ventricular Rate:  65 PR Interval:    QRS Duration: 99 QT Interval:  404 QTC Calculation: 420 R Axis:   81 Text Interpretation:  Sinus rhythm Borderline right axis deviation Normal sinus rhythm Confirmed by Thomasene Lot, [place] [phone]) on 06/15/2017 9:35:24 AM Also confirmed by Thomasene Lot, Ronin Crager [phone]), editor Philomena Doheny [phone])  on 06/15/2017 9:57:20 AM   Radiology Dg Chest 2 View  Result Date: 06/15/2017 CLINICAL DATA:  72 year old female with episode of unresponsiveness this morning at breakfast. EXAM: CHEST - 2 VIEW COMPARISON:  09/07/2015 FINDINGS: AP upright and lateral views of the chest. Lower lung volumes, but no confluent pulmonary opacity. No pneumothorax, pulmonary edema or pleural effusion. Stable borderline to mild cardiomegaly. Other mediastinal contours are within normal limits. No acute osseous abnormality identified. Negative visible bowel gas pattern. IMPRESSION: Lower lung volumes.  No acute cardiopulmonary abnormality. Electronically Signed   By: Genevie Ann M.D.   On: 06/15/2017 09:56   Ct Head Wo Contrast  Result Date: 06/15/2017 CLINICAL DATA:  Altered mental status.  History of dementia. EXAM: CT HEAD WITHOUT CONTRAST TECHNIQUE: Contiguous axial images were obtained from the base of the skull through the vertex without intravenous contrast. COMPARISON:  CT head dated September 01, 2016. FINDINGS: Brain: No evidence of acute infarction,  hemorrhage, hydrocephalus, extra-axial collection or mass lesion/mass effect. Old lacunar infarcts in the left basal ganglia, internal capsule, and left cerebellum are unchanged. Stable mild chronic microvascular ischemic changes. Vascular: No hyperdense vessel or unexpected calcification. Skull: Normal. Negative for fracture or focal lesion. Sinuses/Orbits: No acute finding. Other: None. IMPRESSION: 1.  No acute intracranial abnormality. Electronically Signed   By: Titus Dubin M.D.   On: 06/15/2017 09:49    Procedures Procedures (including critical care time)  Medications Ordered in ED Medications - No data to display   Initial Impression / Assessment and Plan / ED Course  I have reviewed the triage vital signs and the nursing notes.  Pertinent labs & imaging results that were available during my care of the patient were reviewed by me and considered in my medical decision making (see chart for details).     72 year old female with history of dementia presenting with "altered mental status".  Apparently patient was eating breakfast when she put her  head on the table.  Patient was alert the whole time.  However this is different behavior than usual so she sent here to be evaluated.  Patient's baseline is single word responses.  Here in the ED she appears baseline.  Patient has her eyes closed resting, however will answer her name.  Patient's vital signs are reassuring.  9:11 AM Patient is non-participatory in the exam.  Will do workup for altered mental status including CT, urine, chest x-ray.  Will assess for electrolyte abnormalities.  If all reassuring, will plan to discharge home.  Unsure what caused this abnormal behavior but patient appears baseline with normal vital signs and reassuring physical exam on arrival here.  12:33 PM Patient was sent here because she put her head down today at breakfast.  Everything has been very reassuring.  Her CAT scan urine chest x-ray labs have  all been reassuring.  Her vital signs are good and patient appears to be very comfortable.  Will discharge home and have her follow-up with primary care return here as needed.  Final Clinical Impressions(s) / ED Diagnoses   Final diagnoses:  Alteration consciousness    ED Discharge Orders    None       Macarthur Critchley, MD 06/15/17 1234

## 2017-07-05 ENCOUNTER — Emergency Department (HOSPITAL_COMMUNITY): Payer: Medicare Other

## 2017-07-05 ENCOUNTER — Emergency Department (HOSPITAL_COMMUNITY)
Admission: EM | Admit: 2017-07-05 | Discharge: 2017-07-05 | Disposition: A | Discharge status: Skilled Nursing Facility | Payer: Medicare Other | Attending: Emergency Medicine | Admitting: Emergency Medicine

## 2017-07-05 DIAGNOSIS — S0181XA Laceration without foreign body of other part of head, initial encounter: Secondary | ICD-10-CM

## 2017-07-05 DIAGNOSIS — S0993XA Unspecified injury of face, initial encounter: Secondary | ICD-10-CM | POA: Diagnosis present

## 2017-07-05 DIAGNOSIS — S0990XA Unspecified injury of head, initial encounter: Secondary | ICD-10-CM

## 2017-07-05 DIAGNOSIS — Y999 Unspecified external cause status: Secondary | ICD-10-CM | POA: Diagnosis not present

## 2017-07-05 DIAGNOSIS — Y9389 Activity, other specified: Secondary | ICD-10-CM | POA: Insufficient documentation

## 2017-07-05 DIAGNOSIS — W06XXXA Fall from bed, initial encounter: Secondary | ICD-10-CM | POA: Insufficient documentation

## 2017-07-05 DIAGNOSIS — F0390 Unspecified dementia without behavioral disturbance: Secondary | ICD-10-CM | POA: Diagnosis not present

## 2017-07-05 DIAGNOSIS — Y92122 Bedroom in nursing home as the place of occurrence of the external cause: Secondary | ICD-10-CM | POA: Insufficient documentation

## 2017-07-05 DIAGNOSIS — Z79899 Other long term (current) drug therapy: Secondary | ICD-10-CM | POA: Diagnosis not present

## 2017-07-05 NOTE — ED Notes (Signed)
Report called to [facility]. [facility] notified of neef for pt transport.

## 2017-07-05 NOTE — ED Notes (Signed)
Pt to CT

## 2017-07-05 NOTE — ED Notes (Signed)
edp at bedside  

## 2017-07-05 NOTE — ED Provider Notes (Signed)
[facility] DEPT Provider Note   CSN: [id number] Arrival date & time: 07/05/17  0134     History   Chief Complaint Chief Complaint  Patient presents with  . Fall  . Head Laceration    HPI Deanna [name] is a 72 y.o. female.  HPI   Patient presents for evaluation of injury to head when she rolled out of bed and struck her head on a nightstand.  EMS reports she has an injury to the right eyebrow.  He was in the ED about 3 weeks ago evaluation for altered mental status had a head CT at that time which was negative.  She was felt to be at her baseline and discharge.  She has severe dementia.   Level 5 caveat-dementia    Past Medical History:  Diagnosis Date  . Dementia Dx 2015  . Hyperlipidemia DX 2015  . Hypertension Dx 2015    Patient Active Problem List   Diagnosis Date Noted  . Dementia with behavioral disturbance 08/04/2015  . Increased ammonia level 06/15/2014  . Left flank pain 06/15/2014  . Cognitive changes 03/24/2014  . Fatigue 03/24/2014  . Vitreous floaters of right eye 03/11/2014  . Underweight 03/11/2014  . HTN (hypertension) 12/16/2013  . HLD (hyperlipidemia) 12/16/2013  . Dementia 12/16/2013    Past Surgical History:  Procedure Laterality Date  . [place]      OB History   None      Home Medications    Prior to Admission medications   Medication Sig Start Date End Date Taking? Authorizing Provider  buPROPion (WELLBUTRIN XL) 150 MG 24 hr tablet Take 1 tablet (150 mg total) by mouth daily. 10/19/15 06/15/17  Brayton Caves, PA-C  lactase (LACTAID) 3000 units tablet Take 9,000 Units by mouth 3 (three) times daily before meals.    [provider]  mirtazapine (REMERON) 15 MG tablet Take 15 mg by mouth at bedtime.    [provider]  Multiple Vitamin (THERA) TABS Take 1 tablet by mouth daily.     [provider]  OLANZapine (ZYPREXA) 5 MG tablet Take 1 tablet (5 mg  total) by mouth at bedtime. 11/03/15   Funches, Adriana Mccallum, MD  polyethylene glycol (MIRALAX / GLYCOLAX) packet Take 17 g by mouth daily as needed (constipation).    [provider]  senna-docusate (SENOKOT-S) 8.6-50 MG tablet Take 1 tablet by mouth 2 (two) times daily.    [provider]  sertraline (ZOLOFT) 100 MG tablet Take 1 tablet (100 mg total) by mouth daily. 10/19/15 06/15/17  Brayton Caves, PA-C    Family History Family History  Problem Relation Age of Onset  . Cancer Neg Hx   . Heart disease Neg Hx   . Dementia Neg Hx     Social History Social History   Tobacco Use  . Smoking status: Never Smoker  . Smokeless tobacco: Never Used  Substance Use Topics  . Alcohol use: No  . Drug use: No     Allergies   Patient has no known allergies.   Review of Systems Review of Systems  Unable to perform ROS: Dementia     Physical Exam Updated Vital Signs BP (!) 154/79   Pulse 70   Temp 99.2 F (37.3 C) (Oral)   Resp 18   SpO2 100%   Physical Exam  Constitutional: She appears well-developed.  Elderly, frail  HENT:  Head: Normocephalic.  Right forehead contusion, upper, with laceration lower,  gaping and bleeding.  Eyes: Pupils are equal, round, and reactive to light. Conjunctivae and EOM are normal.  Neck: Normal range of motion and phonation normal. Neck supple.  Cardiovascular: Normal rate and regular rhythm.  Pulmonary/Chest: Effort normal and breath sounds normal. She exhibits no tenderness.  Abdominal: Soft. She exhibits no distension. There is no tenderness. There is no guarding.  Musculoskeletal: Normal range of motion.  Neurological: She is alert. She exhibits normal muscle tone.  Skin: Skin is warm and dry.  Psychiatric: She has a normal mood and affect.  Nursing note and vitals reviewed.    ED Treatments / Results  Labs (all labs ordered are listed, but only abnormal results are displayed) Labs Reviewed - No data to  display  EKG None  Radiology Ct Head Wo Contrast  Result Date: 07/05/2017 CLINICAL DATA:  Fall from bed. EXAM: CT HEAD WITHOUT CONTRAST CT CERVICAL SPINE WITHOUT CONTRAST TECHNIQUE: Multidetector CT imaging of the head and cervical spine was performed following the standard protocol without intravenous contrast. Multiplanar CT image reconstructions of the cervical spine were also generated. COMPARISON:  Head CT 06/15/2017 FINDINGS: CT HEAD FINDINGS Brain: No mass lesion, intraparenchymal hemorrhage or extra-axial collection. No evidence of acute cortical infarct. Old left corona radiata lacunar infarct. There is periventricular hypoattenuation compatible with chronic microvascular disease. Vascular: No hyperdense vessel or unexpected calcification. Skull: Small right supraorbital scalp hematoma.  No skull fracture. Sinuses/Orbits: No sinus fluid levels or advanced mucosal thickening. No mastoid effusion. Normal orbits. CT CERVICAL SPINE FINDINGS Alignment: No static subluxation. Facets are aligned. Occipital condyles are normally positioned. Skull base and vertebrae: No acute fracture. Soft tissues and spinal canal: No prevertebral fluid or swelling. No visible canal hematoma. Disc levels: Bilateral C5-6 foraminal stenosis due to uncovertebral hypertrophy. No bony spinal canal stenosis. Upper chest: Biapical scarring. Other: Normal visualized paraspinal cervical soft tissues. IMPRESSION: 1. Chronic small vessel disease and old left corona radiata lacunar infarct without acute intracranial abnormality. 2. No acute fracture or static subluxation of the cervical spine. Electronically Signed   By: Ulyses Jarred M.D.   On: 07/05/2017 05:21   Ct Cervical Spine Wo Contrast  Result Date: 07/05/2017 CLINICAL DATA:  Fall from bed. EXAM: CT HEAD WITHOUT CONTRAST CT CERVICAL SPINE WITHOUT CONTRAST TECHNIQUE: Multidetector CT imaging of the head and cervical spine was performed following the standard protocol without  intravenous contrast. Multiplanar CT image reconstructions of the cervical spine were also generated. COMPARISON:  Head CT 06/15/2017 FINDINGS: CT HEAD FINDINGS Brain: No mass lesion, intraparenchymal hemorrhage or extra-axial collection. No evidence of acute cortical infarct. Old left corona radiata lacunar infarct. There is periventricular hypoattenuation compatible with chronic microvascular disease. Vascular: No hyperdense vessel or unexpected calcification. Skull: Small right supraorbital scalp hematoma.  No skull fracture. Sinuses/Orbits: No sinus fluid levels or advanced mucosal thickening. No mastoid effusion. Normal orbits. CT CERVICAL SPINE FINDINGS Alignment: No static subluxation. Facets are aligned. Occipital condyles are normally positioned. Skull base and vertebrae: No acute fracture. Soft tissues and spinal canal: No prevertebral fluid or swelling. No visible canal hematoma. Disc levels: Bilateral C5-6 foraminal stenosis due to uncovertebral hypertrophy. No bony spinal canal stenosis. Upper chest: Biapical scarring. Other: Normal visualized paraspinal cervical soft tissues. IMPRESSION: 1. Chronic small vessel disease and old left corona radiata lacunar infarct without acute intracranial abnormality. 2. No acute fracture or static subluxation of the cervical spine. Electronically Signed   By: Ulyses Jarred M.D.   On: 07/05/2017 05:21    Procedures .Marland Kitchen  Laceration Repair Date/Time: 07/05/2017 4:06 AM Performed by: Daleen Bo, MD Authorized by: Daleen Bo, MD   Consent:    Consent obtained:  Emergent situation Anesthesia (see MAR for exact dosages):    Anesthesia method:  None Laceration details:    Location: Right forehead, above eyebrow. Repair type:    Repair type:  Simple Pre-procedure details:    Preparation:  Patient was prepped and draped in usual sterile fashion Exploration:    Hemostasis achieved with:  Direct pressure   Wound extent: no fascia violation noted, no  foreign bodies/material noted and no muscle damage noted     Contaminated: no   Treatment:    Area cleansed with:  Shur-Clens   Amount of cleaning:  Standard   Irrigation solution:  Sterile saline   Visualized foreign bodies/material removed: no   Skin repair:    Repair method:  Tissue adhesive Approximation:    Approximation:  Loose Post-procedure details:    Dressing:  Open (no dressing)   Patient tolerance of procedure:  Tolerated well, no immediate complications   (including critical care time)  Medications Ordered in ED Medications - No data to display   Initial Impression / Assessment and Plan / ED Course  I have reviewed the triage vital signs and the nursing notes.  Pertinent labs & imaging results that were available during my care of the patient were reviewed by me and considered in my medical decision making (see chart for details).  Clinical Course as of Jul 05 529  Fri Jul 05, 2017  0529 No intracranial injury, images reviewed  CT Head Wo Contrast [EW]  0529 No cervical spine fracture, images reviewed  CT Cervical Spine Wo Contrast [EW]    Clinical Course User Index [EW] Daleen Bo, MD     Patient Vitals for the past 24 hrs:  BP Temp Temp src Pulse Resp SpO2  07/05/17 0511 (!) 154/79 - - 70 18 100 %  07/05/17 0430 - - - 61 - 100 %  07/05/17 0149 (!) 141/90 99.2 F (37.3 C) Oral 91 16 99 %    5:31 AM Reevaluation with update and discussion. After initial assessment and treatment, an updated evaluation reveals she remains comfortable and has no further complaints.[place] decision making-apparent mechanical fall without serious injury.  Wound sutured, without complication.  Doubt intracranial injury or cervical spine injury.  Nursing Notes Reviewed/ Care Coordinated Applicable Imaging Reviewed Interpretation of Laboratory Data incorporated into ED treatment  The patient appears reasonably screened and/or stabilized for discharge  and I doubt any other medical condition or other [facility] requiring further screening, evaluation, or treatment in the ED at this time prior to discharge.  Plan: Home Medications-continue usual medications, use Tylenol for pain; Home Treatments-wound care.; return here if the recommended treatment, does not improve the symptoms; Recommended follow up-PCP, as needed    Final Clinical Impressions(s) / ED Diagnoses   Final diagnoses:  Injury of head, initial encounter  Facial laceration, initial encounter    ED Discharge Orders    None       Daleen Bo, MD 07/05/17 [phone]

## 2017-07-05 NOTE — Discharge Instructions (Addendum)
Take Tylenol as needed for pain, return here if needed for problems.

## 2017-07-05 NOTE — ED Triage Notes (Signed)
Pt brought in by EMS from [facility]  Pt rolled out of bed and struck her head on the nightstand  Pt has a small laceration to her right eyebrow  Bleeding controlled  Denies any other complaints

## 2017-08-23 ENCOUNTER — Emergency Department (HOSPITAL_COMMUNITY)
Admission: EM | Admit: 2017-08-23 | Discharge: 2017-08-23 | Disposition: A | Discharge status: Home / Self Care | Payer: Medicare Other | Attending: Emergency Medicine | Admitting: Emergency Medicine

## 2017-08-23 ENCOUNTER — Encounter (HOSPITAL_COMMUNITY): Payer: Self-pay | Admitting: *Deleted

## 2017-08-23 ENCOUNTER — Emergency Department (HOSPITAL_COMMUNITY): Payer: Medicare Other

## 2017-08-23 DIAGNOSIS — S0990XA Unspecified injury of head, initial encounter: Secondary | ICD-10-CM | POA: Diagnosis present

## 2017-08-23 DIAGNOSIS — S0003XA Contusion of scalp, initial encounter: Secondary | ICD-10-CM | POA: Insufficient documentation

## 2017-08-23 DIAGNOSIS — W1830XA Fall on same level, unspecified, initial encounter: Secondary | ICD-10-CM | POA: Diagnosis not present

## 2017-08-23 DIAGNOSIS — Y939 Activity, unspecified: Secondary | ICD-10-CM | POA: Diagnosis not present

## 2017-08-23 DIAGNOSIS — Y92129 Unspecified place in nursing home as the place of occurrence of the external cause: Secondary | ICD-10-CM | POA: Insufficient documentation

## 2017-08-23 DIAGNOSIS — Z79899 Other long term (current) drug therapy: Secondary | ICD-10-CM | POA: Insufficient documentation

## 2017-08-23 DIAGNOSIS — W19XXXA Unspecified fall, initial encounter: Secondary | ICD-10-CM

## 2017-08-23 DIAGNOSIS — F0390 Unspecified dementia without behavioral disturbance: Secondary | ICD-10-CM | POA: Insufficient documentation

## 2017-08-23 DIAGNOSIS — Y999 Unspecified external cause status: Secondary | ICD-10-CM | POA: Insufficient documentation

## 2017-08-23 DIAGNOSIS — I10 Essential (primary) hypertension: Secondary | ICD-10-CM | POA: Diagnosis not present

## 2017-08-23 LAB — CBC WITH DIFFERENTIAL/PLATELET
BASOS PCT: 0 %
Basophils Absolute: 0 10*3/uL (ref 0.0–0.1)
EOS ABS: 0.1 10*3/uL (ref 0.0–0.7)
Eosinophils Relative: 1 %
HCT: 43.8 % (ref 36.0–46.0)
HEMOGLOBIN: 14.2 g/dL (ref 12.0–15.0)
Lymphocytes Relative: 24 %
Lymphs Abs: 1.8 10*3/uL (ref 0.7–4.0)
MCH: 28.9 pg (ref 26.0–34.0)
MCHC: 32.4 g/dL (ref 30.0–36.0)
MCV: 89.2 fL (ref 78.0–100.0)
Monocytes Absolute: 0.6 10*3/uL (ref 0.1–1.0)
Monocytes Relative: 7 %
NEUTROS PCT: 68 %
Neutro Abs: 5.3 10*3/uL (ref 1.7–7.7)
Platelets: 708 10*3/uL — ABNORMAL HIGH (ref 150–400)
RBC: 4.91 MIL/uL (ref 3.87–5.11)
RDW: 13.8 % (ref 11.5–15.5)
WBC: 7.8 10*3/uL (ref 4.0–10.5)

## 2017-08-23 LAB — BASIC METABOLIC PANEL
Anion gap: 8 (ref 5–15)
BUN: 14 mg/dL (ref 6–20)
CHLORIDE: 107 mmol/L (ref 101–111)
CO2: 28 mmol/L (ref 22–32)
Calcium: 9.3 mg/dL (ref 8.9–10.3)
Creatinine, Ser: 1 mg/dL (ref 0.44–1.00)
GFR calc non Af Amer: 56 mL/min — ABNORMAL LOW (ref >60)
Glucose, Bld: 99 mg/dL (ref 65–99)
POTASSIUM: 5.1 mmol/L (ref 3.5–5.1)
SODIUM: 143 mmol/L (ref 135–145)

## 2017-08-23 NOTE — ED Provider Notes (Signed)
[facility] DEPT Provider Note   CSN: [id number] Arrival date & time: 08/23/17  1007     History   Chief Complaint Chief Complaint  Patient presents with  . Fall    HPI Deanna [name] is a 72 y.o. female.  Low 5 caveat secondary to dementia.  Patient told in [facility] memory care unit was found on the floor by staff after likely unwitnessed fall.  Patient is unable to provide any information and is minimally communicative.  Staff and EMS notes small contusion of her forehead.  On review prior records patient has a history of frequent falls.  She is not on any anticoagulation.  There is no reported vomiting or seizure activity.  The history is provided by the EMS personnel.  Fall  This is a recurrent problem. The current episode started 1 to 2 hours ago. The problem has not changed since onset.She has tried nothing for the symptoms.    Past Medical History:  Diagnosis Date  . Dementia Dx 2015  . Hyperlipidemia DX 2015  . Hypertension Dx 2015    Patient Active Problem List   Diagnosis Date Noted  . Dementia with behavioral disturbance 08/04/2015  . Increased ammonia level 06/15/2014  . Left flank pain 06/15/2014  . Cognitive changes 03/24/2014  . Fatigue 03/24/2014  . Vitreous floaters of right eye 03/11/2014  . Underweight 03/11/2014  . HTN (hypertension) 12/16/2013  . HLD (hyperlipidemia) 12/16/2013  . Dementia 12/16/2013    Past Surgical History:  Procedure Laterality Date  . [place]      OB History   None      Home Medications    Prior to Admission medications   Medication Sig Start Date End Date Taking? Authorizing Provider  buPROPion (WELLBUTRIN XL) 150 MG 24 hr tablet Take 1 tablet (150 mg total) by mouth daily. 10/19/15 06/15/17  Brayton Caves, PA-C  lactase (LACTAID) 3000 units tablet Take 9,000 Units by mouth 3 (three) times daily before meals.    [provider]  mirtazapine (REMERON) 15 MG  tablet Take 15 mg by mouth at bedtime.    [provider]  Multiple Vitamin (THERA) TABS Take 1 tablet by mouth daily.     [provider]  OLANZapine (ZYPREXA) 5 MG tablet Take 1 tablet (5 mg total) by mouth at bedtime. 11/03/15   Funches, Adriana Mccallum, MD  polyethylene glycol (MIRALAX / GLYCOLAX) packet Take 17 g by mouth daily as needed (constipation).    [provider]  senna-docusate (SENOKOT-S) 8.6-50 MG tablet Take 1 tablet by mouth 2 (two) times daily.    [provider]  sertraline (ZOLOFT) 100 MG tablet Take 1 tablet (100 mg total) by mouth daily. 10/19/15 06/15/17  Brayton Caves, PA-C    Family History Family History  Problem Relation Age of Onset  . Cancer Neg Hx   . Heart disease Neg Hx   . Dementia Neg Hx     Social History Social History   Tobacco Use  . Smoking status: Never Smoker  . Smokeless tobacco: Never Used  Substance Use Topics  . Alcohol use: No  . Drug use: No     Allergies   Patient has no known allergies.   Review of Systems Review of Systems  Unable to perform ROS: Dementia     Physical Exam Updated Vital Signs BP (!) 154/81 (BP Location: Left Arm)   Pulse 97   Temp 98 F (36.7 C) (Axillary)  Resp 18   SpO2 100%   Physical Exam  Constitutional: She appears well-developed and well-nourished.  HENT:  Head: Normocephalic.  Right Ear: External ear normal.  Left Ear: External ear normal.  Nose: Nose normal.  Mouth/Throat: Oropharynx is clear and moist.  Small contusion to forehead.  No lacerations  Eyes: Pupils are equal, round, and reactive to light. Conjunctivae are normal. Right eye exhibits no discharge. Left eye exhibits no discharge.  Neck: No tracheal deviation present.  Patient in c-collar  Cardiovascular: Normal rate, regular rhythm, normal heart sounds and intact distal pulses.  Pulmonary/Chest: Effort normal. No respiratory distress. She has no wheezes. She has no rales.  Abdominal: Soft.  There is no tenderness. There is no guarding.  Musculoskeletal: She exhibits no edema or deformity.  Patient has increased tone in all extremities.  Difficult to get her to relax enough to range of motion but there is no obvious deformity and on palpation she does not appear to be in any appreciable pain.  Neurological: She is alert. She exhibits abnormal muscle tone (Increased). GCS eye subscore is 4. GCS verbal subscore is 4. GCS motor subscore is 5.  Patient not following any commands.  She is alert, she makes an effort to reposition herself.  She is not speaking to myself or staff.  Skin: Skin is warm and dry.  Psychiatric: She has a normal mood and affect.  Nursing note and vitals reviewed.    ED Treatments / Results  Labs (all labs ordered are listed, but only abnormal results are displayed) Labs Reviewed  CBC WITH DIFFERENTIAL/PLATELET - Abnormal; Notable for the following components:      Result Value   Platelets 708 (*)    All other components within normal limits  BASIC METABOLIC PANEL - Abnormal; Notable for the following components:   GFR calc non Af Amer 56 (*)    All other components within normal limits    EKG EKG Interpretation  Date/Time:  Friday August 23 2017 10:28:13 EDT Ventricular Rate:  100 PR Interval:    QRS Duration: 98 QT Interval:  352 QTC Calculation: 454 R Axis:   78 Text Interpretation:  Sinus tachycardia Right atrial enlargement Minimal ST depression, inferior leads increased rate from prior otherwise similar 4/19 Confirmed by Aletta Edouard [phone]) on 08/23/2017 10:49:02 AM   Radiology Ct Head Wo Contrast  Result Date: 08/23/2017 CLINICAL DATA:  Patient in memory care unit was found on the floor by staff after likely unwitnessed fall. Patient is unable to provide any information minimally communicates hip. Small contusion of the forehead. History of frequent falls. Not on anticoagulation. EXAM: CT HEAD WITHOUT CONTRAST CT CERVICAL SPINE WITHOUT  CONTRAST TECHNIQUE: Multidetector CT imaging of the head and cervical spine was performed following the standard protocol without intravenous contrast. Multiplanar CT image reconstructions of the cervical spine were also generated. COMPARISON:  08/23/2017 FINDINGS: CT HEAD FINDINGS Brain: There is mild central and cortical atrophy. Periventricular white matter changes are consistent with small vessel disease. A small lacunar infarct is identified within the LEFT globus pallidus and is chronic. There is no intra or extra-axial fluid collection or mass lesion. The basilar cisterns and ventricles have a normal appearance. There is no CT evidence for acute infarction or hemorrhage. Vascular: No hyperdense vessel or unexpected calcification. Skull: Normal. Negative for fracture or focal lesion. Sinuses/Orbits: No acute finding. Other: LEFT frontal scalp edema not associated with fracture. There is cerumen within the LEFTexternal auditory canal. CT CERVICAL SPINE FINDINGS  Alignment: Normal. Skull base and vertebrae: No acute fracture. No primary bone lesion or focal pathologic process. Soft tissues and spinal canal: No prevertebral fluid or swelling. No visible canal hematoma. Disc levels: Disc height loss at C4-5, C5-6, and C6-7, associated with mild foraminal narrowing at C5-6 in C4-5. Upper chest: There are biapical pleuroparenchymal changes. Other: None IMPRESSION: 1. Atrophy and small vessel disease. 2. Remote lacunar infarct in the LEFT globus pallidus. 3.  No evidence for acute intracranial abnormality. 4.  No evidence for acute cervical spine abnormality. Electronically Signed   By: Nolon Nations M.D.   On: 08/23/2017 11:21   Ct Cervical Spine Wo Contrast  Result Date: 08/23/2017 CLINICAL DATA:  Patient in memory care unit was found on the floor by staff after likely unwitnessed fall. Patient is unable to provide any information minimally communicates hip. Small contusion of the forehead. History of  frequent falls. Not on anticoagulation. EXAM: CT HEAD WITHOUT CONTRAST CT CERVICAL SPINE WITHOUT CONTRAST TECHNIQUE: Multidetector CT imaging of the head and cervical spine was performed following the standard protocol without intravenous contrast. Multiplanar CT image reconstructions of the cervical spine were also generated. COMPARISON:  08/23/2017 FINDINGS: CT HEAD FINDINGS Brain: There is mild central and cortical atrophy. Periventricular white matter changes are consistent with small vessel disease. A small lacunar infarct is identified within the LEFT globus pallidus and is chronic. There is no intra or extra-axial fluid collection or mass lesion. The basilar cisterns and ventricles have a normal appearance. There is no CT evidence for acute infarction or hemorrhage. Vascular: No hyperdense vessel or unexpected calcification. Skull: Normal. Negative for fracture or focal lesion. Sinuses/Orbits: No acute finding. Other: LEFT frontal scalp edema not associated with fracture. There is cerumen within the LEFTexternal auditory canal. CT CERVICAL SPINE FINDINGS Alignment: Normal. Skull base and vertebrae: No acute fracture. No primary bone lesion or focal pathologic process. Soft tissues and spinal canal: No prevertebral fluid or swelling. No visible canal hematoma. Disc levels: Disc height loss at C4-5, C5-6, and C6-7, associated with mild foraminal narrowing at C5-6 in C4-5. Upper chest: There are biapical pleuroparenchymal changes. Other: None IMPRESSION: 1. Atrophy and small vessel disease. 2. Remote lacunar infarct in the LEFT globus pallidus. 3.  No evidence for acute intracranial abnormality. 4.  No evidence for acute cervical spine abnormality. Electronically Signed   By: Nolon Nations M.D.   On: 08/23/2017 11:21    Procedures Procedures (including critical care time)  Medications Ordered in ED Medications - No data to display   Initial Impression / Assessment and Plan / ED Course  I have  reviewed the triage vital signs and the nursing notes.  Pertinent labs & imaging results that were available during my care of the patient were reviewed by me and considered in my medical decision making (see chart for details).  Clinical Course as of Aug 23 1513  Fri Aug 24, 9754  2347 72 year old female with dementia and history of falls here after an unwitnessed fall.  There is a small bruise on her forehead otherwise no obvious signs of trauma.  Due to her lack of ability to add anything to the history and communicate any complaints we are getting a head CT and C-spine CT.  We will also get some screening labs and EKG to evaluate for any metabolic derangements or arrhythmia.   [MB]  6160 Patient's head CT C-spine CT and lab work EKG do not show any obvious signs of acute medical condition that  would require admission.  Patient will be returned back to her facility and will instruct them to observe her for any further signs of any problems.   [MB]    Clinical Course User Index [MB] Hayden Rasmussen, MD     Final Clinical Impressions(s) / ED Diagnoses   Final diagnoses:  Contusion of scalp, initial encounter  Fall, initial encounter    ED Discharge Orders    None       Hayden Rasmussen, MD 08/23/17 1520

## 2017-08-23 NOTE — ED Triage Notes (Signed)
Per EMS, pt from [facility] memory care unit. Pt was found on floor by staff. Pt had been left alone for ~5 minutes. Pt has knot on left side of forehead. No complaints, pt is at baseline confused mental status. Pt placed in c-collar by EMS.   BP 170/82 HR 84 RR 16 O2 98 CBG 105

## 2017-08-23 NOTE — ED Notes (Signed)
Pt placed on bed monitor

## 2017-08-23 NOTE — ED Notes (Signed)
[facility], confirmed pt is at baseline mental status and is nonverbal.

## 2017-08-23 NOTE — Discharge Instructions (Addendum)
Your evaluated in the emergency department for a fall and head injury.  You had a CAT scan of your head and neck along with some basic blood work.  You should use Tylenol for pain and ice to the affected area and return if any worsening symptoms.

## 2017-08-23 NOTE — ED Notes (Signed)
Pt transported to CT ?

## 2017-08-23 NOTE — ED Notes (Signed)
Bed: UQ33 Expected date:  Expected time:  Means of arrival:  Comments: 72 yo fall

## 2017-08-31 ENCOUNTER — Other Ambulatory Visit: Payer: Self-pay

## 2017-08-31 ENCOUNTER — Emergency Department (HOSPITAL_COMMUNITY): Payer: Medicare Other

## 2017-08-31 ENCOUNTER — Encounter (HOSPITAL_COMMUNITY): Payer: Self-pay

## 2017-08-31 ENCOUNTER — Emergency Department (HOSPITAL_COMMUNITY)
Admission: EM | Admit: 2017-08-31 | Discharge: 2017-08-31 | Disposition: A | Discharge status: Home / Self Care | Payer: Medicare Other | Attending: Emergency Medicine | Admitting: Emergency Medicine

## 2017-08-31 DIAGNOSIS — R4182 Altered mental status, unspecified: Secondary | ICD-10-CM | POA: Diagnosis present

## 2017-08-31 DIAGNOSIS — I10 Essential (primary) hypertension: Secondary | ICD-10-CM | POA: Diagnosis not present

## 2017-08-31 DIAGNOSIS — F0390 Unspecified dementia without behavioral disturbance: Secondary | ICD-10-CM | POA: Insufficient documentation

## 2017-08-31 DIAGNOSIS — R531 Weakness: Secondary | ICD-10-CM | POA: Diagnosis not present

## 2017-08-31 DIAGNOSIS — Z79899 Other long term (current) drug therapy: Secondary | ICD-10-CM | POA: Diagnosis not present

## 2017-08-31 LAB — URINALYSIS, ROUTINE W REFLEX MICROSCOPIC
Bilirubin Urine: NEGATIVE
Glucose, UA: NEGATIVE mg/dL
Hgb urine dipstick: NEGATIVE
Ketones, ur: 5 mg/dL — AB
LEUKOCYTES UA: NEGATIVE
Nitrite: NEGATIVE
PROTEIN: NEGATIVE mg/dL
Specific Gravity, Urine: 1.026 (ref 1.005–1.030)
pH: 5 (ref 5.0–8.0)

## 2017-08-31 LAB — CBC WITH DIFFERENTIAL/PLATELET
Basophils Absolute: 0 10*3/uL (ref 0.0–0.1)
Basophils Relative: 0 %
EOS ABS: 0.1 10*3/uL (ref 0.0–0.7)
EOS PCT: 1 %
HCT: 41.6 % (ref 36.0–46.0)
HEMOGLOBIN: 13.4 g/dL (ref 12.0–15.0)
LYMPHS ABS: 1.5 10*3/uL (ref 0.7–4.0)
Lymphocytes Relative: 18 %
MCH: 28.3 pg (ref 26.0–34.0)
MCHC: 32.2 g/dL (ref 30.0–36.0)
MCV: 87.9 fL (ref 78.0–100.0)
Monocytes Absolute: 0.9 10*3/uL (ref 0.1–1.0)
Monocytes Relative: 11 %
Neutro Abs: 6.1 10*3/uL (ref 1.7–7.7)
Neutrophils Relative %: 70 %
PLATELETS: 590 10*3/uL — AB (ref 150–400)
RBC: 4.73 MIL/uL (ref 3.87–5.11)
RDW: 13.4 % (ref 11.5–15.5)
WBC: 8.6 10*3/uL (ref 4.0–10.5)

## 2017-08-31 LAB — COMPREHENSIVE METABOLIC PANEL
ALT: 29 U/L (ref 0–44)
AST: 63 U/L — AB (ref 15–41)
Albumin: 4 g/dL (ref 3.5–5.0)
Alkaline Phosphatase: 69 U/L (ref 38–126)
Anion gap: 10 (ref 5–15)
BUN: 15 mg/dL (ref 8–23)
CO2: 30 mmol/L (ref 22–32)
CREATININE: 0.88 mg/dL (ref 0.44–1.00)
Calcium: 9.2 mg/dL (ref 8.9–10.3)
Chloride: 107 mmol/L (ref 98–111)
GFR calc Af Amer: >60 mL/min (ref >60)
Glucose, Bld: 106 mg/dL — ABNORMAL HIGH (ref 70–99)
Potassium: 3.8 mmol/L (ref 3.5–5.1)
Sodium: 147 mmol/L — ABNORMAL HIGH (ref 135–145)
TOTAL PROTEIN: 6.8 g/dL (ref 6.5–8.1)
Total Bilirubin: 0.9 mg/dL (ref 0.3–1.2)

## 2017-08-31 NOTE — ED Triage Notes (Signed)
Staff at [facility] noted pt. To be "less responsive and not eating and urinating as much as usual" x 2 days. She arrives here alert and essentially non-verbal and resistive of care, as one who is quite confused. She had documented hx of Alzheimer's dis.

## 2017-08-31 NOTE — ED Notes (Signed)
Bed: WA15 Expected date:  Expected time:  Means of arrival:  Comments: 

## 2017-08-31 NOTE — ED Notes (Signed)
[facility] picking up patient to take to back to [facility].

## 2017-08-31 NOTE — ED Notes (Signed)
[facility] here for transport. 

## 2017-08-31 NOTE — ED Provider Notes (Signed)
[place] DEPT Provider Note   CSN: [id number] Arrival date & time: 08/31/17  1734     History   Chief Complaint Chief Complaint  Patient presents with  . Weakness    HPI Deanna [name] is a 72 y.o. female.  Patient is a 72 year old female with a history of dementia, hyperlipidemia patient is a 72 year old female with a history of dementia, hypertension and hyperlipidemia who presents with altered mental status.  She at baseline reportedly has advanced dementia.  Over the last few days, staff reports she has not been eating or drinking normally.  She is been less active.  No known fevers.  Other history is unobtainable.  Of note she was here 8 days ago status post an unwitnessed fall with a small forehead hematoma.  She had a negative CT scan of her head and cervical spine at that time.     Past Medical History:  Diagnosis Date  . Dementia Dx 2015  . Hyperlipidemia DX 2015  . Hypertension Dx 2015    Patient Active Problem List   Diagnosis Date Noted  . Dementia with behavioral disturbance 08/04/2015  . Increased ammonia level 06/15/2014  . Left flank pain 06/15/2014  . Cognitive changes 03/24/2014  . Fatigue 03/24/2014  . Vitreous floaters of right eye 03/11/2014  . Underweight 03/11/2014  . HTN (hypertension) 12/16/2013  . HLD (hyperlipidemia) 12/16/2013  . Dementia 12/16/2013    Past Surgical History:  Procedure Laterality Date  . [place]      OB History   None      Home Medications    Prior to Admission medications   Medication Sig Start Date End Date Taking? Authorizing Provider  buPROPion (WELLBUTRIN XL) 150 MG 24 hr tablet Take 150 mg by mouth daily.   Yes [provider]  lactase (LACTAID) 3000 units tablet Take 9,000 Units by mouth 3 (three) times daily before meals.   Yes [provider]  mirtazapine (REMERON) 15 MG tablet Take 15 mg by mouth at bedtime.   Yes [provider]  Multiple Vitamin (THERA) TABS Take 1 tablet by mouth daily.    Yes [provider]  OLANZapine (ZYPREXA) 5 MG tablet Take 1 tablet (5 mg total) by mouth at bedtime. 11/03/15  Yes Funches, Josalyn, MD  senna-docusate (SENOKOT-S) 8.6-50 MG tablet Take 1 tablet by mouth 2 (two) times daily.   Yes [provider]  sertraline (ZOLOFT) 100 MG tablet Take 100 mg by mouth daily.   Yes [provider]  simvastatin (ZOCOR) 40 MG tablet Take 40 mg by mouth 2 (two) times daily.   Yes [provider]  buPROPion (WELLBUTRIN XL) 150 MG 24 hr tablet Take 1 tablet (150 mg total) by mouth daily. 10/19/15 08/23/17  Brayton Caves, PA-C  polyethylene glycol (MIRALAX / GLYCOLAX) packet Take 17 g by mouth daily as needed (constipation).    [provider]  sertraline (ZOLOFT) 100 MG tablet Take 1 tablet (100 mg total) by mouth daily. 10/19/15 08/23/17  Brayton Caves, PA-C    Family History Family History  Problem Relation Age of Onset  . Cancer Neg Hx   . Heart disease Neg Hx   . Dementia Neg Hx     Social History Social History   Tobacco Use  . Smoking status: Never Smoker  . Smokeless tobacco: Never Used  Substance Use Topics  . Alcohol use: No  . Drug use: No  Allergies   Patient has no known allergies.   Review of Systems Review of Systems  Unable to perform ROS: Dementia     Physical Exam Updated Vital Signs BP (!) 151/82 (BP Location: Left Arm)   Pulse 96   Temp 99.1 F (37.3 C) (Rectal)   Resp 18   SpO2 100%   Physical Exam  Constitutional: She appears well-developed and well-nourished.  HENT:  Head: Normocephalic and atraumatic.  Eyes: Pupils are equal, round, and reactive to light.  Neck: Normal range of motion. Neck supple.  Kyphosis present  Cardiovascular: Normal rate, regular rhythm and normal heart sounds.  Pulmonary/Chest: Effort normal and breath sounds normal. No respiratory distress. She has no  wheezes. She has no rales. She exhibits no tenderness.  Abdominal: Soft. Bowel sounds are normal. There is no tenderness. There is no rebound and no guarding.  Musculoskeletal: Normal range of motion. She exhibits no edema.  Lymphadenopathy:    She has no cervical adenopathy.  Neurological: She is alert.  She is alert with eyes open but is nonverbal.  She is moving all extremities symmetrically  Skin: Skin is warm and dry. No rash noted.  Psychiatric: She has a normal mood and affect.     ED Treatments / Results  Labs (all labs ordered are listed, but only abnormal results are displayed) Labs Reviewed  COMPREHENSIVE METABOLIC PANEL - Abnormal; Notable for the following components:      Result Value   Sodium 147 (*)    Glucose, Bld 106 (*)    AST 63 (*)    All other components within normal limits  CBC WITH DIFFERENTIAL/PLATELET - Abnormal; Notable for the following components:   Platelets 590 (*)    All other components within normal limits  URINALYSIS, ROUTINE W REFLEX MICROSCOPIC - Abnormal; Notable for the following components:   Ketones, ur 5 (*)    All other components within normal limits    EKG EKG Interpretation  Date/Time:  Saturday August 31 2017 17:59:46 EDT Ventricular Rate:  98 PR Interval:    QRS Duration: 90 QT Interval:  341 QTC Calculation: 436 R Axis:   77 Text Interpretation:  Sinus rhythm Right atrial enlargement Minimal ST depression, diffuse leads since last tracing no significant change Confirmed by Malvin Johns [phone]) on 08/31/2017 6:48:35 PM   Radiology Dg Chest 2 View  Result Date: 08/31/2017 CLINICAL DATA:  Increased weakness EXAM: CHEST - 2 VIEW COMPARISON:  06/15/2017 FINDINGS: Cardiac shadow is within normal limits. The lungs are well aerated bilaterally. No focal infiltrate or sizable effusion is noted. Biapical scarring is noted and stable. No new focal infiltrate is seen. No bony abnormality is noted. IMPRESSION: No active cardiopulmonary  disease. Electronically Signed   By: Inez Catalina M.D.   On: 08/31/2017 18:50   Ct Head Wo Contrast  Result Date: 08/31/2017 CLINICAL DATA:  Altered level of consciousness EXAM: CT HEAD WITHOUT CONTRAST TECHNIQUE: Contiguous axial images were obtained from the base of the skull through the vertex without intravenous contrast. COMPARISON:  08/23/2017 FINDINGS: Brain: Diffuse atrophic changes are identified. No findings to suggest acute hemorrhage, acute infarction or space-occupying mass lesion seen. Lacunar infarcts are noted in the basal ganglia on the left stable from the prior exam. No new focal abnormality is seen. Vascular: No hyperdense vessel or unexpected calcification. Skull: Normal. Negative for fracture or focal lesion. Sinuses/Orbits: No acute finding. Other: None. IMPRESSION: Chronic atrophic and ischemic changes without acute abnormality. Electronically Signed   By:  Inez Catalina M.D.   On: 08/31/2017 18:49    Procedures Procedures (including critical care time)  Medications Ordered in ED Medications - No data to display   Initial Impression / Assessment and Plan / ED Course  I have reviewed the triage vital signs and the nursing notes.  Pertinent labs & imaging results that were available during my care of the patient were reviewed by me and considered in my medical decision making (see chart for details).     Patient with a history of dementia presents with decreased oral intake.  She has alert with her eyes open but is noncommunicative.  She has no focal deficits.  She had a CT scan of her head which shows no acute abnormalities.  Her labs are non-concerning.  Her urine does not appear to be infected.  A chest x-ray shows no evidence of pneumonia.  Her vital signs are normal.  She was discharged back to the nursing facility.  Return precautions were given with her discharge papers.  Final Clinical Impressions(s) / ED Diagnoses   Final diagnoses:  Generalized weakness     ED Discharge Orders    None       Malvin Johns, MD 08/31/17 1610

## 2017-09-04 ENCOUNTER — Emergency Department (HOSPITAL_COMMUNITY): Payer: Medicare Other

## 2017-09-04 ENCOUNTER — Inpatient Hospital Stay (HOSPITAL_COMMUNITY)
Admission: EM | Admit: 2017-09-04 | Discharge: 2017-09-07 | DRG: 690 | Disposition: A | Discharge status: Home / Self Care | Payer: Medicare Other | Attending: Internal Medicine | Admitting: Internal Medicine

## 2017-09-04 ENCOUNTER — Encounter (HOSPITAL_COMMUNITY): Payer: Self-pay | Admitting: Emergency Medicine

## 2017-09-04 DIAGNOSIS — Z79899 Other long term (current) drug therapy: Secondary | ICD-10-CM

## 2017-09-04 DIAGNOSIS — E860 Dehydration: Secondary | ICD-10-CM | POA: Diagnosis present

## 2017-09-04 DIAGNOSIS — R4182 Altered mental status, unspecified: Secondary | ICD-10-CM | POA: Diagnosis not present

## 2017-09-04 DIAGNOSIS — Z66 Do not resuscitate: Secondary | ICD-10-CM | POA: Diagnosis present

## 2017-09-04 DIAGNOSIS — R296 Repeated falls: Secondary | ICD-10-CM | POA: Diagnosis present

## 2017-09-04 DIAGNOSIS — F0390 Unspecified dementia without behavioral disturbance: Secondary | ICD-10-CM | POA: Diagnosis present

## 2017-09-04 DIAGNOSIS — N390 Urinary tract infection, site not specified: Secondary | ICD-10-CM | POA: Diagnosis not present

## 2017-09-04 DIAGNOSIS — Z91018 Allergy to other foods: Secondary | ICD-10-CM

## 2017-09-04 DIAGNOSIS — N179 Acute kidney failure, unspecified: Secondary | ICD-10-CM | POA: Diagnosis present

## 2017-09-04 DIAGNOSIS — E870 Hyperosmolality and hypernatremia: Secondary | ICD-10-CM | POA: Diagnosis present

## 2017-09-04 DIAGNOSIS — Z681 Body mass index (BMI) 19 or less, adult: Secondary | ICD-10-CM

## 2017-09-04 DIAGNOSIS — B9620 Unspecified Escherichia coli [E. coli] as the cause of diseases classified elsewhere: Secondary | ICD-10-CM | POA: Diagnosis present

## 2017-09-04 DIAGNOSIS — G309 Alzheimer's disease, unspecified: Secondary | ICD-10-CM | POA: Diagnosis present

## 2017-09-04 DIAGNOSIS — D473 Essential (hemorrhagic) thrombocythemia: Secondary | ICD-10-CM | POA: Diagnosis present

## 2017-09-04 DIAGNOSIS — B964 Proteus (mirabilis) (morganii) as the cause of diseases classified elsewhere: Secondary | ICD-10-CM | POA: Diagnosis present

## 2017-09-04 DIAGNOSIS — I10 Essential (primary) hypertension: Secondary | ICD-10-CM | POA: Diagnosis not present

## 2017-09-04 DIAGNOSIS — R627 Adult failure to thrive: Secondary | ICD-10-CM | POA: Diagnosis present

## 2017-09-04 DIAGNOSIS — E785 Hyperlipidemia, unspecified: Secondary | ICD-10-CM | POA: Diagnosis present

## 2017-09-04 DIAGNOSIS — G9340 Encephalopathy, unspecified: Secondary | ICD-10-CM | POA: Diagnosis present

## 2017-09-04 DIAGNOSIS — F0281 Dementia in other diseases classified elsewhere with behavioral disturbance: Secondary | ICD-10-CM | POA: Diagnosis present

## 2017-09-04 DIAGNOSIS — E440 Moderate protein-calorie malnutrition: Secondary | ICD-10-CM | POA: Diagnosis present

## 2017-09-04 DIAGNOSIS — Z7189 Other specified counseling: Secondary | ICD-10-CM

## 2017-09-04 LAB — CBC WITH DIFFERENTIAL/PLATELET
Basophils Absolute: 0 10*3/uL (ref 0.0–0.1)
Basophils Relative: 0 %
Eosinophils Absolute: 0.1 10*3/uL (ref 0.0–0.7)
Eosinophils Relative: 1 %
HCT: 46.1 % — ABNORMAL HIGH (ref 36.0–46.0)
HEMOGLOBIN: 14.6 g/dL (ref 12.0–15.0)
LYMPHS PCT: 18 %
Lymphs Abs: 1.9 10*3/uL (ref 0.7–4.0)
MCH: 28.3 pg (ref 26.0–34.0)
MCHC: 31.7 g/dL (ref 30.0–36.0)
MCV: 89.5 fL (ref 78.0–100.0)
Monocytes Absolute: 0.8 10*3/uL (ref 0.1–1.0)
Monocytes Relative: 7 %
NEUTROS ABS: 7.7 10*3/uL (ref 1.7–7.7)
NEUTROS PCT: 74 %
PLATELETS: 666 10*3/uL — AB (ref 150–400)
RBC: 5.15 MIL/uL — ABNORMAL HIGH (ref 3.87–5.11)
RDW: 13.8 % (ref 11.5–15.5)
WBC: 10.6 10*3/uL — AB (ref 4.0–10.5)

## 2017-09-04 LAB — COMPREHENSIVE METABOLIC PANEL
ALBUMIN: 4.1 g/dL (ref 3.5–5.0)
ALK PHOS: 76 U/L (ref 38–126)
ALT: 22 U/L (ref 0–44)
ANION GAP: 8 (ref 5–15)
AST: 42 U/L — ABNORMAL HIGH (ref 15–41)
BILIRUBIN TOTAL: 1.2 mg/dL (ref 0.3–1.2)
BUN: 28 mg/dL — ABNORMAL HIGH (ref 8–23)
CALCIUM: 9.2 mg/dL (ref 8.9–10.3)
CO2: 33 mmol/L — ABNORMAL HIGH (ref 22–32)
Chloride: 104 mmol/L (ref 98–111)
Creatinine, Ser: 1.04 mg/dL — ABNORMAL HIGH (ref 0.44–1.00)
GFR calc Af Amer: >60 mL/min (ref >60)
GFR, EST NON AFRICAN AMERICAN: 53 mL/min — AB (ref >60)
Glucose, Bld: 102 mg/dL — ABNORMAL HIGH (ref 70–99)
POTASSIUM: 5 mmol/L (ref 3.5–5.1)
Sodium: 145 mmol/L (ref 135–145)
TOTAL PROTEIN: 7.6 g/dL (ref 6.5–8.1)

## 2017-09-04 LAB — URINALYSIS, COMPLETE (UACMP) WITH MICROSCOPIC
BILIRUBIN URINE: NEGATIVE
GLUCOSE, UA: NEGATIVE mg/dL
Ketones, ur: 20 mg/dL — AB
NITRITE: POSITIVE — AB
PH: 6 (ref 5.0–8.0)
Protein, ur: NEGATIVE mg/dL
SPECIFIC GRAVITY, URINE: 1.024 (ref 1.005–1.030)
WBC, UA: >50 WBC/hpf — ABNORMAL HIGH (ref 0–5)

## 2017-09-04 LAB — AMMONIA: AMMONIA: 11 umol/L (ref 9–35)

## 2017-09-04 MED ORDER — SERTRALINE HCL 50 MG PO TABS
100.0000 mg | ORAL_TABLET | Freq: Every day | ORAL | Status: DC
  Administered 2017-09-05 – 2017-09-07 (×3): 100 mg via ORAL
  Filled 2017-09-04 (×3): qty 2

## 2017-09-04 MED ORDER — BUPROPION HCL ER (XL) 150 MG PO TB24
150.0000 mg | ORAL_TABLET | Freq: Every day | ORAL | Status: DC
  Administered 2017-09-05 – 2017-09-07 (×3): 150 mg via ORAL
  Filled 2017-09-04 (×3): qty 1

## 2017-09-04 MED ORDER — SODIUM CHLORIDE 0.9 % IV BOLUS
500.0000 mL | Freq: Once | INTRAVENOUS | Status: AC
  Administered 2017-09-04: 500 mL via INTRAVENOUS

## 2017-09-04 MED ORDER — ENOXAPARIN SODIUM 40 MG/0.4ML ~~LOC~~ SOLN
40.0000 mg | SUBCUTANEOUS | Status: DC
  Administered 2017-09-04 – 2017-09-06 (×3): 40 mg via SUBCUTANEOUS
  Filled 2017-09-04 (×3): qty 0.4

## 2017-09-04 MED ORDER — SIMVASTATIN 40 MG PO TABS
40.0000 mg | ORAL_TABLET | Freq: Every day | ORAL | Status: DC
  Administered 2017-09-05 – 2017-09-06 (×2): 40 mg via ORAL
  Filled 2017-09-04 (×2): qty 1

## 2017-09-04 MED ORDER — ONDANSETRON HCL 4 MG/2ML IJ SOLN: 4.0000 mg | Freq: Four times a day (QID) | INTRAMUSCULAR | Status: DC | PRN

## 2017-09-04 MED ORDER — ONDANSETRON HCL 4 MG PO TABS
4.0000 mg | ORAL_TABLET | Freq: Four times a day (QID) | ORAL | Status: DC | PRN
  Filled 2017-09-04: qty 1

## 2017-09-04 MED ORDER — ADULT MULTIVITAMIN W/MINERALS CH
1.0000 | ORAL_TABLET | Freq: Every day | ORAL | Status: DC
  Administered 2017-09-05 – 2017-09-07 (×3): 1 via ORAL
  Filled 2017-09-04 (×3): qty 1

## 2017-09-04 MED ORDER — LACTASE 3000 UNITS PO TABS
9000.0000 [IU] | ORAL_TABLET | Freq: Three times a day (TID) | ORAL | Status: DC
  Administered 2017-09-05 – 2017-09-07 (×7): 9000 [IU] via ORAL
  Filled 2017-09-04 (×10): qty 3

## 2017-09-04 MED ORDER — POLYETHYLENE GLYCOL 3350 17 G PO PACK: 17.0000 g | PACK | Freq: Every day | ORAL | Status: DC | PRN

## 2017-09-04 MED ORDER — ENSURE ENLIVE PO LIQD: 237.0000 mL | Freq: Two times a day (BID) | ORAL | Status: DC

## 2017-09-04 MED ORDER — ACETAMINOPHEN 325 MG PO TABS: 650.0000 mg | ORAL_TABLET | Freq: Four times a day (QID) | ORAL | Status: DC | PRN

## 2017-09-04 MED ORDER — ACETAMINOPHEN 650 MG RE SUPP: 650.0000 mg | Freq: Four times a day (QID) | RECTAL | Status: DC | PRN

## 2017-09-04 MED ORDER — SODIUM CHLORIDE 0.9 % IV SOLN
1.0000 g | INTRAVENOUS | Status: DC
  Administered 2017-09-04 – 2017-09-06 (×3): 1 g via INTRAVENOUS
  Filled 2017-09-04: qty 1
  Filled 2017-09-04 (×3): qty 10
  Filled 2017-09-04: qty 1

## 2017-09-04 MED ORDER — OLANZAPINE 5 MG PO TABS
5.0000 mg | ORAL_TABLET | Freq: Every day | ORAL | Status: DC
  Administered 2017-09-04 – 2017-09-06 (×3): 5 mg via ORAL
  Filled 2017-09-04 (×3): qty 1

## 2017-09-04 MED ORDER — MIRTAZAPINE 30 MG PO TABS
15.0000 mg | ORAL_TABLET | Freq: Every day | ORAL | Status: DC
  Administered 2017-09-04 – 2017-09-06 (×3): 15 mg via ORAL
  Filled 2017-09-04: qty 1
  Filled 2017-09-04 (×3): qty 0.5
  Filled 2017-09-04 (×2): qty 1
  Filled 2017-09-04: qty 0.5

## 2017-09-04 MED ORDER — SODIUM CHLORIDE 0.9 % IV SOLN
INTRAVENOUS | Status: DC
  Administered 2017-09-04 – 2017-09-05 (×2): via INTRAVENOUS

## 2017-09-04 MED ORDER — SENNOSIDES-DOCUSATE SODIUM 8.6-50 MG PO TABS
1.0000 | ORAL_TABLET | Freq: Two times a day (BID) | ORAL | Status: DC
  Administered 2017-09-04 – 2017-09-07 (×6): 1 via ORAL
  Filled 2017-09-04 (×6): qty 1

## 2017-09-04 NOTE — ED Triage Notes (Addendum)
Per EMS, patient from [facility], staff reports patient had decreased PO intake x3 days. Hx dementia. Staff denies acute changes. Nonverbal. Seen on 6/29 for same.

## 2017-09-04 NOTE — ED Notes (Signed)
Deanna [name], pt son can be reached at [phone]

## 2017-09-04 NOTE — ED Notes (Signed)
ED Provider at bedside. 

## 2017-09-04 NOTE — ED Notes (Signed)
Purewick applied to collect urine sample. 

## 2017-09-04 NOTE — ED Notes (Signed)
Family at bedside. 

## 2017-09-04 NOTE — ED Notes (Signed)
2 RN's and NT attempted in and out cath. Pt fighting nursing staff and sterile field compromised. RN and NT attempted to start over and unable to obtain sample.  Pure wick re-placed to attempt to obtain sample.

## 2017-09-04 NOTE — ED Notes (Signed)
Text paged admitting MD to notify that urine is resulted.  Report given to [place] on 4W

## 2017-09-04 NOTE — ED Provider Notes (Signed)
Patient is a 72 year old female with a history of advanced dementia [place] DEPT Provider Note   CSN: [id number] Arrival date & time: 09/04/17  1134     History   Chief Complaint Chief Complaint  Patient presents with  . Failure To Thrive    HPI Deanna [name] is a 72 y.o. female.  Patient is a 72 year old female with a history of advanced dementia, hypertension and hyperlipidemia who presents with change in mental status.  Per EMS, she resides at [facility] facility and they report that patient has not been eating or drinking well.  She is taking her medications but they are having a harder time giving her her medications.  Reportedly she is at her baseline mental status.  History is limited due to patient's dementia.  I did contact the staff at [facility] who reports that patient has not been eating or drinking well for the last 3 days.  They also states that normally she is able to ambulate unassisted but she has been unstable and unable to ambulate without assistance.  They do not note any change in her mental status.  I spoke with her son also who is at the bedside who does not report any change in her mental status although sometimes she will be more communicative than she is today.     Past Medical History:  Diagnosis Date  . Dementia Dx 2015  . Hyperlipidemia DX 2015  . Hypertension Dx 2015    Patient Active Problem List   Diagnosis Date Noted  . Dementia with behavioral disturbance 08/04/2015  . Increased ammonia level 06/15/2014  . Left flank pain 06/15/2014  . Cognitive changes 03/24/2014  . Fatigue 03/24/2014  . Vitreous floaters of right eye 03/11/2014  . Underweight 03/11/2014  . HTN (hypertension) 12/16/2013  . HLD (hyperlipidemia) 12/16/2013  . Dementia 12/16/2013    Past Surgical History:  Procedure Laterality Date  . [place]      OB History   None      Home Medications     Prior to Admission medications   Medication Sig Start Date End Date Taking? Authorizing Provider  buPROPion (WELLBUTRIN XL) 150 MG 24 hr tablet Take 1 tablet (150 mg total) by mouth daily. 10/19/15 09/04/17 Yes Noel, Tiffany S, PA-C  lactase (LACTAID) 3000 units tablet Take 9,000 Units by mouth 3 (three) times daily before meals.   Yes [provider]  mirtazapine (REMERON) 15 MG tablet Take 15 mg by mouth at bedtime.   Yes [provider]  Multiple Vitamin (THERA) TABS Take 1 tablet by mouth daily.    Yes [provider]  OLANZapine (ZYPREXA) 5 MG tablet Take 1 tablet (5 mg total) by mouth at bedtime. 11/03/15  Yes Funches, Josalyn, MD  polyethylene glycol (MIRALAX / GLYCOLAX) packet Take 17 g by mouth daily as needed (constipation).   Yes [provider]  senna-docusate (SENOKOT-S) 8.6-50 MG tablet Take 1 tablet by mouth 2 (two) times daily.   Yes [provider]  sertraline (ZOLOFT) 100 MG tablet Take 100 mg by mouth daily.   Yes [provider]  simvastatin (ZOCOR) 40 MG tablet Take 40 mg by mouth 2 (two) times daily.   Yes [provider]  sertraline (ZOLOFT) 100 MG tablet Take 1 tablet (100 mg total) by mouth daily. Patient not taking: Reported on 09/04/2017 10/19/15 08/23/17  Brayton Caves, PA-C    Family History Family History  Problem  Relation Age of Onset  . Cancer Neg Hx   . Heart disease Neg Hx   . Dementia Neg Hx     Social History Social History   Tobacco Use  . Smoking status: Never Smoker  . Smokeless tobacco: Never Used  Substance Use Topics  . Alcohol use: No  . Drug use: No     Allergies   Patient has no known allergies.   Review of Systems Review of Systems  Unable to perform ROS: Dementia     Physical Exam Updated Vital Signs BP (!) 142/93   Pulse 76   Temp 97.7 F (36.5 C) (Oral)   Resp 17   SpO2 98%   Physical Exam  Constitutional: She appears well-developed and well-nourished.   HENT:  Head: Normocephalic and atraumatic.  Patient has some muscle rigidity which son states is baseline for her  Eyes: Pupils are equal, round, and reactive to light.  Neck: Normal range of motion. Neck supple.  Cardiovascular: Normal rate, regular rhythm and normal heart sounds.  Pulmonary/Chest: Effort normal and breath sounds normal. No respiratory distress. She has no wheezes. She has no rales. She exhibits no tenderness.  Abdominal: Soft. Bowel sounds are normal. There is no tenderness. There is no rebound and no guarding.  Musculoskeletal: Normal range of motion. She exhibits no edema.  Lymphadenopathy:    She has no cervical adenopathy.  Neurological:  Patient is awake and will open her eyes to painful stimuli.  She does move all extremities symmetrically and has symmetric grip strength but will not follow commands.  Is nonverbal.  Skin: Skin is warm and dry. No rash noted.  Psychiatric: She has a normal mood and affect.     ED Treatments / Results  Labs (all labs ordered are listed, but only abnormal results are displayed) Labs Reviewed  COMPREHENSIVE METABOLIC PANEL - Abnormal; Notable for the following components:      Result Value   CO2 33 (*)    Glucose, Bld 102 (*)    BUN 28 (*)    Creatinine, Ser 1.04 (*)    AST 42 (*)    GFR calc non Af Amer 53 (*)    All other components within normal limits  CBC WITH DIFFERENTIAL/PLATELET - Abnormal; Notable for the following components:   WBC 10.6 (*)    RBC 5.15 (*)    HCT 46.1 (*)    Platelets 666 (*)    All other components within normal limits  AMMONIA  URINALYSIS, ROUTINE W REFLEX MICROSCOPIC    EKG EKG Interpretation  Date/Time:  Wednesday September 04 2017 11:47:57 EDT Ventricular Rate:  77 PR Interval:    QRS Duration: 94 QT Interval:  391 QTC Calculation: 443 R Axis:   77 Text Interpretation:  Sinus rhythm Consider left ventricular hypertrophy since last tracing no significant change Confirmed by Malvin Johns [phone]) on 09/04/2017 12:43:19 PM   Radiology Ct Head Wo Contrast  Result Date: 09/04/2017 CLINICAL DATA:  Decreased p.o. intake.  Dementia.  Nonverbal. EXAM: CT HEAD WITHOUT CONTRAST TECHNIQUE: Contiguous axial images were obtained from the base of the skull through the vertex without intravenous contrast. COMPARISON:  08/31/2017. FINDINGS: Brain: No acute stroke, hemorrhage, mass lesion, or extra-axial fluid. Generalized atrophy. Hydrocephalus ex vacuo. Old RIGHT temporal lobe infarct. Old LEFT basal ganglia infarct. Hypoattenuation of white matter, likely small vessel disease. Vascular: Calcification of the cavernous internal carotid arteries consistent with cerebrovascular atherosclerotic disease. No signs of intracranial large vessel occlusion. Skull:  No worrisome osseous lesions. Sinuses/Orbits: Clear sinuses.  No orbital findings of significance. Other: No middle ear or mastoid fluid. IMPRESSION: Atrophy and small vessel disease. Areas of remote cerebral infarction. No acute intracranial findings. No significant change from 06/29. Electronically Signed   By: Staci Righter M.D.   On: 09/04/2017 14:08   Dg Chest Port 1 View  Result Date: 09/04/2017 CLINICAL DATA:  Decreased p.o. intake EXAM: PORTABLE CHEST 1 VIEW COMPARISON:  08/31/2017 FINDINGS: The heart size and mediastinal contours are within normal limits. Both lungs are clear. The visualized skeletal structures are unremarkable. IMPRESSION: No active disease. Electronically Signed   By: Kathreen Devoid   On: 09/04/2017 13:31    Procedures Procedures (including critical care time)  Medications Ordered in ED Medications  sodium chloride 0.9 % bolus 500 mL (0 mLs Intravenous Stopped 09/04/17 1355)  sodium chloride 0.9 % bolus 500 mL (500 mLs Intravenous New Bag/Given 09/04/17 1416)     Initial Impression / Assessment and Plan / ED Course  I have reviewed the triage vital signs and the nursing notes.  Pertinent labs & imaging results  that were available during my care of the patient were reviewed by me and considered in my medical decision making (see chart for details).     Patient is a 72 year old female who presents from [place] facility with altered mental status and inability to ambulate.  She has limited oral intake.  Her labs show a mildly elevated creatinine as compared to her normal creatinine 4 days ago.  Her BUN is also elevated.  She likely has a degree of dehydration.  She was given IV fluids in the ED.  She has received 1 L so far but so far has not urinated.  And in and out was attempted but was unsuccessful due to patient's combativeness.  A pure wick is in place but she has not urinated yet.  Given her decompensation with inability to ambulate, I will consult hospitalist for admission.  I spoke with Dr. Starla Link who will admit the patient.  Final Clinical Impressions(s) / ED Diagnoses   Final diagnoses:  Dehydration  AKI (acute kidney injury) [facility])    ED Discharge Orders    None       Malvin Johns, MD 09/04/17 1524

## 2017-09-04 NOTE — ED Notes (Signed)
MD made aware of delay in urine collection.

## 2017-09-04 NOTE — Progress Notes (Signed)
Received patient from ED, VS obtained, purewick intact

## 2017-09-04 NOTE — H&P (Signed)
History and Physical    Deanna [name] QGB:[id number] DOB: 05-07-1945 DOA: 09/04/2017  PCP: System, Pcp Not In   Patient coming from: nursing home  I have personally briefly reviewed patient's old medical records in [place]  Chief Complaint: Sent from nursing home for decreased oral intake and inability to ambulate  HPI: Deanna [name] is a 72 y.o. female with medical history significant of advanced dementia, hypertension and hyperlipidemia was sent from nursing home because of altered mental status.  Reliability of the history is very poor as patient hardly answers any questions.  Patient's son is at bedside but does not give much information.  I reviewed your providers documentation and prior medical records myself.  As per the nursing home, patient has not been eating or drinking well for the last 3 days and it is harder for her to swallow.  Patient normally is able to ambulate unassisted but has been unstable and unable to ambulate without assistance days.  ED Course: CT head was negative for any acute abnormality.  She was started on IV fluids for probable dehydration.  Hospitalist service was asked to evaluate the patient.  Review of Systems: Could not be obtained because of patient's mental status.  Past Medical History:  Diagnosis Date  . Dementia Dx 2015  . Hyperlipidemia DX 2015  . Hypertension Dx 2015    Past Surgical History:  Procedure Laterality Date  . [place] history  reports that she has never smoked. She has never used smokeless tobacco. She reports that she does not drink alcohol or use drugs. Currently resides in a nursing home  No Known Allergies  Family History  Problem Relation Age of Onset  . Cancer Neg Hx   . Heart disease Neg Hx   . Dementia Neg Hx     Prior to Admission medications   Medication Sig Start Date End Date Taking? Authorizing Provider  buPROPion (WELLBUTRIN XL) 150 MG 24 hr tablet Take 1 tablet (150  mg total) by mouth daily. 10/19/15 09/04/17 Yes Noel, Tiffany S, PA-C  lactase (LACTAID) 3000 units tablet Take 9,000 Units by mouth 3 (three) times daily before meals.   Yes [provider]  mirtazapine (REMERON) 15 MG tablet Take 15 mg by mouth at bedtime.   Yes [provider]  Multiple Vitamin (THERA) TABS Take 1 tablet by mouth daily.    Yes [provider]  OLANZapine (ZYPREXA) 5 MG tablet Take 1 tablet (5 mg total) by mouth at bedtime. 11/03/15  Yes Funches, Josalyn, MD  polyethylene glycol (MIRALAX / GLYCOLAX) packet Take 17 g by mouth daily as needed (constipation).   Yes [provider]  senna-docusate (SENOKOT-S) 8.6-50 MG tablet Take 1 tablet by mouth 2 (two) times daily.   Yes [provider]  sertraline (ZOLOFT) 100 MG tablet Take 100 mg by mouth daily.   Yes [provider]  simvastatin (ZOCOR) 40 MG tablet Take 40 mg by mouth 2 (two) times daily.   Yes [provider]  sertraline (ZOLOFT) 100 MG tablet Take 1 tablet (100 mg total) by mouth daily. Patient not taking: Reported on 09/04/2017 10/19/15 08/23/17  Brayton Caves, PA-C    Physical Exam: Vitals:   09/04/17 1147 09/04/17 1330 09/04/17 1452  BP: 116/72 110/70 (!) 142/93  Pulse: 77 66 76  Resp: 13 12 17   Temp: 97.7 F (36.5 C)    TempSrc: Oral    SpO2: 100% 99% 98%  Constitutional: Very thinly built elderly female, looks older than stated age, NAD, calm, comfortable Vitals:   09/04/17 1147 09/04/17 1330 09/04/17 1452  BP: 116/72 110/70 (!) 142/93  Pulse: 77 66 76  Resp: 13 12 17   Temp: 97.7 F (36.5 C)    TempSrc: Oral    SpO2: 100% 99% 98%   Eyes: PERRL, lids and conjunctivae normal ENMT: Mucous membranes are dry. Posterior pharynx clear of any exudate or lesions.  Neck: normal, supple, no masses, no thyromegaly Respiratory: Bilateral decreased breath sounds at bases with no wheezing or crackles. Normal respiratory effort. No accessory muscle use.    Cardiovascular: S1-S2 positive, rate controlled. No extremity edema.   Abdomen: no tenderness, no masses palpated. No hepatosplenomegaly. Bowel sounds positive.  Musculoskeletal: no clubbing / cyanosis. No joint deformity upper and lower extremities.  Skin: no rashes, lesions, ulcers. No induration Neurologic: Awake but hardly answers any questions.  CN 2-12 grossly intact.  Moves extremities.  No obvious focal neurologic deficit Psychiatric: Could not be assessed because of patient's mental status  Labs on Admission: I have personally reviewed following labs and imaging studies  CBC: Recent Labs  Lab 08/31/17 1901 09/04/17 1215  WBC 8.6 10.6*  NEUTROABS 6.1 7.7  HGB 13.4 14.6  HCT 41.6 46.1*  MCV 87.9 89.5  PLT 590* 092*   Basic Metabolic Panel: Recent Labs  Lab 08/31/17 1901 09/04/17 1215  NA 147* 145  K 3.8 5.0  CL 107 104  CO2 30 33*  GLUCOSE 106* 102*  BUN 15 28*  CREATININE 0.88 1.04*  CALCIUM 9.2 9.2   GFR: CrCl cannot be calculated (Unknown ideal weight.). Liver Function Tests: Recent Labs  Lab 08/31/17 1901 09/04/17 1215  AST 63* 42*  ALT 29 22  ALKPHOS 69 76  BILITOT 0.9 1.2  PROT 6.8 7.6  ALBUMIN 4.0 4.1   No results for input(s): LIPASE, AMYLASE in the last 168 hours. Recent Labs  Lab 09/04/17 1415  AMMONIA 11   Coagulation Profile: No results for input(s): INR, PROTIME in the last 168 hours. Cardiac Enzymes: No results for input(s): CKTOTAL, CKMB, CKMBINDEX, TROPONINI in the last 168 hours. BNP (last 3 results) No results for input(s): PROBNP in the last 8760 hours. HbA1C: No results for input(s): HGBA1C in the last 72 hours. CBG: No results for input(s): GLUCAP in the last 168 hours. Lipid Profile: No results for input(s): CHOL, HDL, LDLCALC, TRIG, CHOLHDL, LDLDIRECT in the last 72 hours. Thyroid Function Tests: No results for input(s): TSH, T4TOTAL, FREET4, T3FREE, THYROIDAB in the last 72 hours. Anemia Panel: No results for  input(s): VITAMINB12, FOLATE, FERRITIN, TIBC, IRON, RETICCTPCT in the last 72 hours. Urine analysis:    Component Value Date/Time   COLORURINE YELLOW 08/31/2017 1817   APPEARANCEUR CLEAR 08/31/2017 1817   APPEARANCEUR Turbid (A) 03/24/2014 1011   LABSPEC 1.026 08/31/2017 1817   PHURINE 5.0 08/31/2017 1817   GLUCOSEU NEGATIVE 08/31/2017 1817   HGBUR NEGATIVE 08/31/2017 1817   BILIRUBINUR NEGATIVE 08/31/2017 1817   BILIRUBINUR negative 06/15/2014 1119   BILIRUBINUR Negative 03/24/2014 1011   KETONESUR 5 (A) 08/31/2017 1817   PROTEINUR NEGATIVE 08/31/2017 1817   UROBILINOGEN 1.0 06/15/2014 1119   NITRITE NEGATIVE 08/31/2017 1817   LEUKOCYTESUR NEGATIVE 08/31/2017 1817   LEUKOCYTESUR Negative 03/24/2014 1011    Radiological Exams on Admission: Ct Head Wo Contrast  Result Date: 09/04/2017 CLINICAL DATA:  Decreased p.o. intake.  Dementia.  Nonverbal. EXAM: CT HEAD WITHOUT CONTRAST TECHNIQUE: Contiguous axial images were obtained  from the base of the skull through the vertex without intravenous contrast. COMPARISON:  08/31/2017. FINDINGS: Brain: No acute stroke, hemorrhage, mass lesion, or extra-axial fluid. Generalized atrophy. Hydrocephalus ex vacuo. Old RIGHT temporal lobe infarct. Old LEFT basal ganglia infarct. Hypoattenuation of white matter, likely small vessel disease. Vascular: Calcification of the cavernous internal carotid arteries consistent with cerebrovascular atherosclerotic disease. No signs of intracranial large vessel occlusion. Skull: No worrisome osseous lesions. Sinuses/Orbits: Clear sinuses.  No orbital findings of significance. Other: No middle ear or mastoid fluid. IMPRESSION: Atrophy and small vessel disease. Areas of remote cerebral infarction. No acute intracranial findings. No significant change from 06/29. Electronically Signed   By: Staci Righter M.D.   On: 09/04/2017 14:08   Dg Chest Port 1 View  Result Date: 09/04/2017 CLINICAL DATA:  Decreased p.o. intake EXAM:  PORTABLE CHEST 1 VIEW COMPARISON:  08/31/2017 FINDINGS: The heart size and mediastinal contours are within normal limits. Both lungs are clear. The visualized skeletal structures are unremarkable. IMPRESSION: No active disease. Electronically Signed   By: Kathreen Devoid   On: 09/04/2017 13:31    Assessment/Plan Active Problems:   HTN (hypertension)   Dementia   Altered mental status, unspecified    Altered mental status with decreased oral intake and difficulty ambulation in a patient with advanced dementia -CT of the head is negative for any acute abnormality.  No signs of infection currently although urinalysis is pending.  If urinalysis is negative and patient's mental status does not improve with hydration, might have to consider MRI of the brain along with EEG. -Monitor mental status.  Fall precautions -Ammonia level normal.  Check vitamin B12, folate and TSH levels in a.m. -PT/OT/SLP evaluation. -If oral intake does not improve, son states that patient does not want feeding tube. -Palliative care consult for goals of care  Dehydration -Continue IV fluids.  Advanced dementia -Monitor.  Continue current nursing home medications  DVT prophylaxis: Lovenox Code Status: DNR Family Communication: Spoke to son at bedside Disposition Plan: Depends on clinical outcome Consults called: None Admission status: Observation in MedSurg  Severity of Illness: The appropriate patient status for this patient is OBSERVATION. Observation status is judged to be reasonable and necessary in order to provide the required intensity of service to ensure the patient's safety. The patient's presenting symptoms, physical exam findings, and initial radiographic and laboratory data in the context of their medical condition is felt to place them at decreased risk for further clinical deterioration. Furthermore, it is anticipated that the patient will be medically stable for discharge from the hospital within 2  midnights of admission. The following factors support the patient status of observation.   " The patient's presenting symptoms include decreased oral intake. " The physical exam findings include unable to answer questions. " The initial radiographic and laboratory data are probable dehydration.      Aline August MD [facility] Pager [phone]  If 7PM-7AM, please contact night-coverage [email or web address] Password [email or web address]  09/04/2017, 4:03 PM

## 2017-09-04 NOTE — ED Notes (Signed)
Patient transported to CT 

## 2017-09-04 NOTE — ED Notes (Signed)
ED TO INPATIENT HANDOFF REPORT  Name/Age/Gender Deanna [name] 72 y.o. female  Code Status    Code Status Orders  (From admission, onward)        Start     Ordered   09/04/17 1601  Do not attempt resuscitation (DNR)  Continuous    Question Answer Comment  In the event of cardiac or respiratory ARREST Do not call a "code blue"   In the event of cardiac or respiratory ARREST Do not perform Intubation, CPR, defibrillation or ACLS   In the event of cardiac or respiratory ARREST Use medication by any route, position, wound care, and other measures to relive pain and suffering. May use oxygen, suction and manual treatment of airway obstruction as needed for comfort.      09/04/17 1603    Code Status History    Date Active Date Inactive Code Status Order ID Comments User Context   08/06/2015 1651 08/08/2015 2325 Full Code [id number]  Lacretia Leigh, MD ED   08/03/2015 1529 08/04/2015 2230 Full Code [id number]  Charlesetta Shanks, MD ED      Home/SNF/Other Nursing Home  Chief Complaint failure to thrive  Level of Care/Admitting Diagnosis ED Disposition    ED Disposition Condition [place]: [facility] [[id number]]  Level of Care: Med-Surg [16]  Diagnosis: Altered mental status, unspecified [4166063]  Admitting Physician: Aline August [[id number]]  Attending Physician: Aline August [[id number]]  PT Class (Do Not Modify): Observation [104]  PT Acc Code (Do Not Modify): Observation [10022]       Medical History Past Medical History:  Diagnosis Date  . Dementia Dx 2015  . Hyperlipidemia DX 2015  . Hypertension Dx 2015    Allergies No Known Allergies  IV Location/Drains/Wounds Patient Lines/Drains/Airways Status   Active Line/Drains/Airways    Name:   Placement date:   Placement time:   Site:   Days:   Peripheral IV 09/04/17 Left Antecubital   09/04/17    1215    Antecubital   less than 1          Labs/Imaging Results for orders placed  or performed during the hospital encounter of 09/04/17 (from the past 48 hour(s))  Comprehensive metabolic panel     Status: Abnormal   Collection Time: 09/04/17 12:15 PM  Result Value Ref Range   Sodium 145 135 - 145 mmol/L   Potassium 5.0 3.5 - 5.1 mmol/L   Chloride 104 98 - 111 mmol/L    Comment: Please note change in reference range.   CO2 33 (H) 22 - 32 mmol/L   Glucose, Bld 102 (H) 70 - 99 mg/dL    Comment: Please note change in reference range.   BUN 28 (H) 8 - 23 mg/dL    Comment: Please note change in reference range.   Creatinine, Ser 1.04 (H) 0.44 - 1.00 mg/dL   Calcium 9.2 8.9 - 10.3 mg/dL   Total Protein 7.6 6.5 - 8.1 g/dL   Albumin 4.1 3.5 - 5.0 g/dL   AST 42 (H) 15 - 41 U/L   ALT 22 0 - 44 U/L    Comment: Please note change in reference range.   Alkaline Phosphatase 76 38 - 126 U/L   Total Bilirubin 1.2 0.3 - 1.2 mg/dL   GFR calc non Af Amer 53 (L) >60 mL/min   GFR calc Af Amer >60 >60 mL/min    Comment: (NOTE) The eGFR has been calculated using the CKD EPI equation. This  calculation has not been validated in all clinical situations. eGFR's persistently <60 mL/min signify possible Chronic Kidney Disease.    Anion gap 8 5 - 15    Comment: Performed at [facility], [place] [address] [place], [place] [address]  CBC with Differential     Status: Abnormal   Collection Time: 09/04/17 12:15 PM  Result Value Ref Range   WBC 10.6 (H) 4.0 - 10.5 K/uL   RBC 5.15 (H) 3.87 - 5.11 MIL/uL   Hemoglobin 14.6 12.0 - 15.0 g/dL   HCT 46.1 (H) 36.0 - 46.0 %   MCV 89.5 78.0 - 100.0 fL   MCH 28.3 26.0 - 34.0 pg   MCHC 31.7 30.0 - 36.0 g/dL   RDW 13.8 11.5 - 15.5 %   Platelets 666 (H) 150 - 400 K/uL   Neutrophils Relative % 74 %   Neutro Abs 7.7 1.7 - 7.7 K/uL   Lymphocytes Relative 18 %   Lymphs Abs 1.9 0.7 - 4.0 K/uL   Monocytes Relative 7 %   Monocytes Absolute 0.8 0.1 - 1.0 K/uL   Eosinophils Relative 1 %   Eosinophils Absolute 0.1 0.0 - 0.7 K/uL    Basophils Relative 0 %   Basophils Absolute 0.0 0.0 - 0.1 K/uL    Comment: Performed at [facility], [place] [address], [place], [place] [address]  Ammonia     Status: None   Collection Time: 09/04/17  2:15 PM  Result Value Ref Range   Ammonia 11 9 - 35 umol/L    Comment: Performed at [facility], [place] [address], [place], [place] [address]   Ct Head Wo Contrast  Result Date: 09/04/2017 CLINICAL DATA:  Decreased p.o. intake.  Dementia.  Nonverbal. EXAM: CT HEAD WITHOUT CONTRAST TECHNIQUE: Contiguous axial images were obtained from the base of the skull through the vertex without intravenous contrast. COMPARISON:  08/31/2017. FINDINGS: Brain: No acute stroke, hemorrhage, mass lesion, or extra-axial fluid. Generalized atrophy. Hydrocephalus ex vacuo. Old RIGHT temporal lobe infarct. Old LEFT basal ganglia infarct. Hypoattenuation of white matter, likely small vessel disease. Vascular: Calcification of the cavernous internal carotid arteries consistent with cerebrovascular atherosclerotic disease. No signs of intracranial large vessel occlusion. Skull: No worrisome osseous lesions. Sinuses/Orbits: Clear sinuses.  No orbital findings of significance. Other: No middle ear or mastoid fluid. IMPRESSION: Atrophy and small vessel disease. Areas of remote cerebral infarction. No acute intracranial findings. No significant change from 06/29. Electronically Signed   By: Staci Righter M.D.   On: 09/04/2017 14:08   Dg Chest Port 1 View  Result Date: 09/04/2017 CLINICAL DATA:  Decreased p.o. intake EXAM: PORTABLE CHEST 1 VIEW COMPARISON:  08/31/2017 FINDINGS: The heart size and mediastinal contours are within normal limits. Both lungs are clear. The visualized skeletal structures are unremarkable. IMPRESSION: No active disease. Electronically Signed   By: Kathreen Devoid   On: 09/04/2017 13:31    Pending Labs Unresulted Labs (From admission, onward)   Start     Ordered   09/11/17  0500  Creatinine, serum  (enoxaparin (LOVENOX)    CrCl >/= 30 ml/min)  Weekly,   R    Comments:  while on enoxaparin therapy    09/04/17 1603   09/05/17 0500  HIV antibody (Routine Testing)  Tomorrow morning,   R     09/04/17 1603   09/05/17 0500  Comprehensive metabolic panel  Tomorrow morning,   R     09/04/17 1603   09/05/17 0500  CBC  Tomorrow morning,   R     09/04/17 1603   09/05/17 0500  Vitamin B12  Tomorrow morning,   R     09/04/17 1603   09/05/17 0500  Folate RBC  Tomorrow morning,   R     09/04/17 1603   09/05/17 0500  TSH  Tomorrow morning,   R     09/04/17 1603   09/04/17 1602  Urinalysis, Complete w Microscopic  Once,   R     09/04/17 1603   09/04/17 1602  Urine culture  Once,   R     09/04/17 1603   09/04/17 1600  CBC  (enoxaparin (LOVENOX)    CrCl >/= 30 ml/min)  Once,   R    Comments:  Baseline for enoxaparin therapy IF NOT ALREADY DRAWN.  Notify MD if PLT < 100 K.    09/04/17 1603   09/04/17 1600  Creatinine, serum  (enoxaparin (LOVENOX)    CrCl >/= 30 ml/min)  Once,   R    Comments:  Baseline for enoxaparin therapy IF NOT ALREADY DRAWN.    09/04/17 1603   09/04/17 1147  Urinalysis, Routine w reflex microscopic  STAT,   STAT     09/04/17 1146      Vitals/Pain Today's Vitals   09/04/17 1147 09/04/17 1330 09/04/17 1452  BP: 116/72 110/70 (!) 142/93  Pulse: 77 66 76  Resp: '13 12 17  '$ Temp: 97.7 F (36.5 C)    TempSrc: Oral    SpO2: 100% 99% 98%    Isolation Precautions No active isolations  Medications Medications  buPROPion (WELLBUTRIN XL) 24 hr tablet 150 mg (has no administration in time range)  lactase (LACTAID) tablet 9,000 Units (has no administration in time range)  mirtazapine (REMERON) tablet 15 mg (has no administration in time range)  THERA TABS 1 tablet (has no administration in time range)  OLANZapine (ZYPREXA) tablet 5 mg (has no administration in time range)  polyethylene glycol (MIRALAX / GLYCOLAX) packet 17 g (has no  administration in time range)  senna-docusate (Senokot-S) tablet 1 tablet (has no administration in time range)  sertraline (ZOLOFT) tablet 100 mg (has no administration in time range)  simvastatin (ZOCOR) tablet 40 mg (has no administration in time range)  enoxaparin (LOVENOX) injection 40 mg (has no administration in time range)  0.9 %  sodium chloride infusion (has no administration in time range)  acetaminophen (TYLENOL) tablet 650 mg (has no administration in time range)    Or  acetaminophen (TYLENOL) suppository 650 mg (has no administration in time range)  ondansetron (ZOFRAN) tablet 4 mg (has no administration in time range)    Or  ondansetron (ZOFRAN) injection 4 mg (has no administration in time range)  sodium chloride 0.9 % bolus 500 mL (0 mLs Intravenous Stopped 09/04/17 1355)  sodium chloride 0.9 % bolus 500 mL (0 mLs Intravenous Stopped 09/04/17 1543)    Mobility non-ambulatory

## 2017-09-04 NOTE — ED Notes (Signed)
XR at bedside

## 2017-09-04 NOTE — ED Notes (Signed)
Bed: WA04 Expected date:  Expected time:  Means of arrival:  Comments: EMS/FTT 

## 2017-09-05 DIAGNOSIS — N179 Acute kidney failure, unspecified: Secondary | ICD-10-CM | POA: Diagnosis not present

## 2017-09-05 DIAGNOSIS — F0390 Unspecified dementia without behavioral disturbance: Secondary | ICD-10-CM | POA: Diagnosis not present

## 2017-09-05 DIAGNOSIS — I10 Essential (primary) hypertension: Secondary | ICD-10-CM

## 2017-09-05 DIAGNOSIS — E440 Moderate protein-calorie malnutrition: Secondary | ICD-10-CM | POA: Diagnosis not present

## 2017-09-05 DIAGNOSIS — R4182 Altered mental status, unspecified: Secondary | ICD-10-CM

## 2017-09-05 DIAGNOSIS — E860 Dehydration: Secondary | ICD-10-CM

## 2017-09-05 LAB — CBC
HCT: 41.7 % (ref 36.0–46.0)
HEMOGLOBIN: 13.1 g/dL (ref 12.0–15.0)
MCH: 28.1 pg (ref 26.0–34.0)
MCHC: 31.4 g/dL (ref 30.0–36.0)
MCV: 89.3 fL (ref 78.0–100.0)
PLATELETS: 555 10*3/uL — AB (ref 150–400)
RBC: 4.67 MIL/uL (ref 3.87–5.11)
RDW: 13.6 % (ref 11.5–15.5)
WBC: 8.6 10*3/uL (ref 4.0–10.5)

## 2017-09-05 LAB — COMPREHENSIVE METABOLIC PANEL
ALK PHOS: 68 U/L (ref 38–126)
ALT: 22 U/L (ref 0–44)
ANION GAP: 9 (ref 5–15)
AST: 32 U/L (ref 15–41)
Albumin: 3.5 g/dL (ref 3.5–5.0)
BUN: 22 mg/dL (ref 8–23)
CALCIUM: 9 mg/dL (ref 8.9–10.3)
CO2: 29 mmol/L (ref 22–32)
CREATININE: 0.83 mg/dL (ref 0.44–1.00)
Chloride: 111 mmol/L (ref 98–111)
GFR calc non Af Amer: >60 mL/min (ref >60)
GLUCOSE: 85 mg/dL (ref 70–99)
Potassium: 4 mmol/L (ref 3.5–5.1)
Sodium: 149 mmol/L — ABNORMAL HIGH (ref 135–145)
Total Bilirubin: 0.8 mg/dL (ref 0.3–1.2)
Total Protein: 6.9 g/dL (ref 6.5–8.1)

## 2017-09-05 LAB — MRSA PCR SCREENING: MRSA by PCR: NEGATIVE

## 2017-09-05 LAB — VITAMIN B12: VITAMIN B 12: 1007 pg/mL — AB (ref 180–914)

## 2017-09-05 LAB — TSH: TSH: 1.161 u[IU]/mL (ref 0.350–4.500)

## 2017-09-05 MED ORDER — ENSURE ENLIVE PO LIQD
237.0000 mL | Freq: Three times a day (TID) | ORAL | Status: DC
  Administered 2017-09-05 – 2017-09-07 (×6): 237 mL via ORAL

## 2017-09-05 MED ORDER — DEXTROSE-NACL 5-0.9 % IV SOLN
INTRAVENOUS | Status: DC
  Administered 2017-09-05: 19:00:00 via INTRAVENOUS
  Administered 2017-09-06: 100 mL/h via INTRAVENOUS

## 2017-09-05 NOTE — Progress Notes (Signed)
Initial Nutrition Assessment  DOCUMENTATION CODES:   Non-severe (moderate) malnutrition in context of chronic illness  INTERVENTION:  - Will increase Ensure Enlive to TID, each supplement provides 350 kcal and 20 grams of protein. - Continue to encourage PO intakes.  - Tech/RN to provide feeding assistance, if needed, when family not present.   NUTRITION DIAGNOSIS:   Moderate Malnutrition related to chronic illness as evidenced by mild fat depletion, mild muscle depletion.  GOAL:   Patient will meet greater than or equal to 90% of their needs  MONITOR:   PO intake, Supplement acceptance, Weight trends, Labs  REASON FOR ASSESSMENT:   Malnutrition Screening Tool  ASSESSMENT:   72 y.o. female with medical history significant of advanced dementia, hypertension and hyperlipidemia was sent from nursing home because of altered mental status. Reliability of the history is very poor. As per the nursing home, patient has not been eating or drinking well for the last 3 days and it is harder for her to swallow. Patient normally is able to ambulate unassisted but has been unstable and unable to ambulate without assistance days.  BMI indicates normal weight/borderline underweight. Per chart review, pt consumed 5% of breakfast which is noted to be bites of grits and scrambled eggs. Patient is noted to be disoriented x4 and had eyes closed throughout RD visit. Patient's son was at bedside and provided all information. He states that patient had a good appetite at the nursing home PTA, would eat 3 meals/day and 1-2 snacks.   He states that several years ago the family gave her Ensure and she gained weight (which was desired) and did well with these. She has not received this supplement at the nursing home. Son states that patient drank a full bottle of Ensure and ate a cup of ice cream shortly before RD visit.   Son denies patient having any chewing or swallowing difficulties at baseline. SLP saw  patient a short time ago and recommendation made for Dysphagia 3, thin liquids diet.  NFPE outlined below. Per chart review, patient has lost 5 lbs (4.2% body weight) in the past 4 months. This is not significant for time frame. Son feels that this change in this time frame is likely accurate.  Medications reviewed; 9000 units Lactaid TID, daily multivitamin with minerals, 1 tablet Senokot BID.  Labs reviewed; Na: 149 mmol/L. IVF: NS @ 100 mL/hr.       NUTRITION - FOCUSED PHYSICAL EXAM:    Most Recent Value  Orbital Region  Unable to assess  Upper Arm Region  Mild depletion  Thoracic and Lumbar Region  Unable to assess  Buccal Region  Mild depletion  Temple Region  Mild depletion  Clavicle Bone Region  Mild depletion  Clavicle and Acromion Bone Region  Mild depletion  Scapular Bone Region  Unable to assess  Dorsal Hand  No depletion  Patellar Region  No depletion  Anterior Thigh Region  No depletion  Posterior Calf Region  No depletion  Edema (RD Assessment)  None  Hair  Reviewed  Eyes  Unable to assess [sleeping throughout visit]  Mouth  Reviewed  Skin  Reviewed  Nails  Reviewed       Diet Order:   Diet Order           DIET DYS 3 Room service appropriate? Yes; Fluid consistency: Thin  Diet effective now          EDUCATION NEEDS:   No education needs have been identified at this time  Skin:  Skin Assessment: Reviewed RN Assessment  Last BM:  PTA/unknown  Height:   Ht Readings from Last 1 Encounters:  09/04/17 5\' 6"  (1.676 m)    Weight:   Wt Readings from Last 1 Encounters:  09/04/17 115 lb 4.8 oz (52.3 kg)    Ideal Body Weight:  59.09 kg  BMI:  Body mass index is 18.61 kg/m.  Estimated Nutritional Needs:   Kcal:  1460-1675 (28-32 kcal/kg)  Protein:  55-65 grams  Fluid:  >/= 1.6 L/day      Jarome Matin, MS, RD, LDN, [facility] Inpatient Clinical Dietitian Pager # [phone] After hours/weekend pager # [phone]

## 2017-09-05 NOTE — Evaluation (Signed)
Clinical/Bedside Swallow Evaluation Patient Details  Name: Deanna [name] MRN: [id number] Date of Birth: 1945/11/02  Today's Date: 09/05/2017 Time: SLP Start Time (ACUTE ONLY): 1117 SLP Stop Time (ACUTE ONLY): 1155 SLP Time Calculation (min) (ACUTE ONLY): 38 min  Past Medical History:  Past Medical History:  Diagnosis Date  . Dementia Dx 2015  . Hyperlipidemia DX 2015  . Hypertension Dx 2015   Past Surgical History:  Past Surgical History:  Procedure Laterality Date  . [place]    HPI:  72 yo female adm to [facility] with AMS, decreased mobility and intake.  PMH + for dementia.  Pt normally feeds herself and ambulates well.  She has 7 teeth per son but eats regular/thin diet. Pt found to have UTI, CXR and CT Head negative 09/04/2017.    Assessment / Plan / Recommendation Clinical Impression  Pt presents with cognitive based dysphagia that is exacerbated by her current medical condition.  She did not appear to have focal CN deficits.   Voice is soft and speech is mildly dysarthric but son states this is normal.  Pt assisted to self feed icecream, graham cracker and juice via straw.  No indication of airway compromise.  Multiple swallows across consistencies noted which is likely due to piecemealing.  As pt with poor dentition, recommend dys3/thin diet with precautions.  Educated son to s/s of dysphagia, gustatory changes with dementia and compensation strategies. No SlP follow up indicated.  Thanks.  SLP Visit Diagnosis: Dysphagia, oral phase (R13.11)    Aspiration Risk  Mild aspiration risk    Diet Recommendation Dysphagia 3 (Mech soft);Thin liquid   Liquid Administration via: Straw;Cup Medication Administration: Other (Comment)(whole or crushed if not contraindicated and large) Supervision: Staff to assist with self feeding Compensations: Slow rate;Small sips/bites Postural Changes: Seated upright at 90 degrees;Remain upright for at least 30 minutes after po intake     Other  Recommendations Oral Care Recommendations: Oral care BID   Follow up Recommendations None      Frequency and Duration      n/a      Prognosis    n/a    Swallow Study   General Date of Onset: 09/05/17 HPI: 72 yo female adm to [facility] with AMS, decreased mobility and intake.  PMH + for dementia.  Pt normally feeds herself and ambulates well.  She has 7 teeth per son but eats regular/thin diet.  Type of Study: Bedside Swallow Evaluation Diet Prior to this Study: Regular;Thin liquids(heart healthy) Temperature Spikes Noted: No Respiratory Status: Room air History of Recent Intubation: No Behavior/Cognition: Alert;Doesn't follow directions Oral Cavity Assessment: Other (comment)(unable to assess) Oral Cavity - Dentition: Other (Comment)(per son pt has 7 teeth) Self-Feeding Abilities: Needs assist;Total assist(hand over hand assist) Patient Positioning: Upright in bed Baseline Vocal Quality: Low vocal intensity(per son, this is baseline) Volitional Cough: Cognitively unable to elicit Volitional Swallow: Unable to elicit    Oral/Motor/Sensory Function Overall Oral Motor/Sensory Function: Generalized oral weakness(no focal CN deficits from informal eval, pt did not follow directions, able to seal lips on spoon/straw)   Ice Chips Ice chips: Not tested   Thin Liquid Thin Liquid: Within functional limits Presentation: Straw;Self Fed Other Comments: multiple swallows    Nectar Thick Nectar Thick Liquid: Not tested   Honey Thick Honey Thick Liquid: Not tested   Puree Puree: Impaired Presentation: Self Fed;Spoon Pharyngeal Phase Impairments: Multiple swallows   Solid   GO   Solid: Impaired Presentation: Self Fed Oral Phase Impairments:  Impaired mastication Pharyngeal Phase Impairments: Multiple swallows Other Comments: slow mastication        [name] 09/05/2017,12:06 PM   [name], [place] [facility] SLP [phone]

## 2017-09-05 NOTE — Evaluation (Signed)
Physical Therapy Evaluation Patient Details Name: Deanna [name] MRN: [id number] DOB: September 18, 1945 Today's Date: 09/05/2017   History of Present Illness  72 yo female admitted with decreased oral intake and difficulty ambulating. Hx of advanced dementia. Pt is from an ALF memory care    Clinical Impression  On eval, pt required Total assist +2 for bed mobility and Mod assist +2 for standing and taking a few ambulatory steps with 2 HHA. She kept her eyes closed for majority of session. She did not follow verbal commands or initiate any mobility tasks. No family present during session. Recommend SNF unless ALF can provide current level of care. Will follow during hospital stay.     Follow Up Recommendations SNF;Supervision/Assistance - 24 hour(unless ALF can provide current level of care)    Equipment Recommendations  None recommended by PT    Recommendations for Other Services       Precautions / Restrictions Precautions Precautions: Fall Restrictions Weight Bearing Restrictions: No      Mobility  Bed Mobility Overal bed mobility: Needs Assistance Bed Mobility: Supine to Sit;Sit to Supine     Supine to sit: Total assist;+2 for physical assistance;+2 for safety/equipment Sit to supine: Total assist;+2 for physical assistance;+2 for safety/equipment   General bed mobility comments: Pt did not follow commands or initiate task(s). Assist required for trunk and bil LEs. Utilized bedpad to position pt at EOB  Transfers Overall transfer level: Needs assistance Equipment used: 2 person hand held assist Transfers: Sit to/from Stand Sit to Stand: Mod assist;+2 physical assistance;+2 safety/equipment;From elevated surface         General transfer comment: Assist to rise, stabilize, control descent. Again pt did not follow commands or initiate task.   Ambulation/Gait Ambulation/Gait assistance: Mod assist;+2 physical assistance;+2 safety/equipment Gait Distance (Feet): 3  Feet Assistive device: 2 person hand held assist Gait Pattern/deviations: Narrow base of support;Scissoring     General Gait Details: Pt took a few steps forwards then backwards. She would not take any lateral steps along side of bed.   Stairs            Wheelchair Mobility    Modified Rankin (Stroke Patients Only)       Balance Overall balance assessment: Needs assistance         Standing balance support: Bilateral upper extremity supported Standing balance-Leahy Scale: Poor                               Pertinent Vitals/Pain Pain Assessment: Faces Faces Pain Scale: No hurt    Home Living Family/patient expects to be discharged to:: Skilled nursing facility     Type of Home: Assisted living           Additional Comments: pt is unable to provide info    Prior Function Level of Independence: Needs assistance   Gait / Transfers Assistance Needed: unsure of PLOF. Per chart, pt was ambulatory at ALF .            Hand Dominance        Extremity/Trunk Assessment   Upper Extremity Assessment Upper Extremity Assessment: Defer to OT evaluation    Lower Extremity Assessment Lower Extremity Assessment: Generalized weakness(able to weightbear in standing)    Cervical / Trunk Assessment Cervical / Trunk Assessment: Normal  Communication   Communication: (pt spoke a few words during session)  Cognition Arousal/Alertness: Lethargic Behavior During Therapy: Flat affect Overall Cognitive Status: History of  cognitive impairments - at baseline                                        General Comments      Exercises     Assessment/Plan    PT Assessment Patient needs continued PT services  PT Problem List Decreased balance;Decreased mobility;Decreased knowledge of use of DME;Decreased cognition       PT Treatment Interventions Gait training;Functional mobility training;Therapeutic activities;Therapeutic  exercise;Balance training;Patient/family education;DME instruction    PT Goals (Current goals can be found in the Care Plan section)  Acute Rehab PT Goals Patient Stated Goal: pt unable to state PT Goal Formulation: Patient unable to participate in goal setting Time For Goal Achievement: 09/19/17 Potential to Achieve Goals: Poor    Frequency Min 2X/week   Barriers to discharge        Co-evaluation               AM-PAC PT "6 Clicks" Daily Activity  Outcome Measure Difficulty turning over in bed (including adjusting bedclothes, sheets and blankets)?: Unable Difficulty moving from lying on back to sitting on the side of the bed? : Unable Difficulty sitting down on and standing up from a chair with arms (e.g., wheelchair, bedside commode, etc,.)?: Unable Help needed moving to and from a bed to chair (including a wheelchair)?: Total Help needed walking in hospital room?: Total Help needed climbing 3-5 steps with a railing? : Total 6 Click Score: 6    End of Session Equipment Utilized During Treatment: Gait belt Activity Tolerance: Patient tolerated treatment well Patient left: in bed;with call bell/phone within reach;with bed alarm set   PT Visit Diagnosis: Difficulty in walking, not elsewhere classified (R26.2);Adult, failure to thrive (R62.7);Other abnormalities of gait and mobility (R26.89);History of falling (Z91.81)    Time: 1001-1016 PT Time Calculation (min) (ACUTE ONLY): 15 min   Charges:   PT Evaluation $PT Eval Moderate Complexity: 1 Mod     PT G Codes:          Weston Anna, MPT Pager: [phone]

## 2017-09-05 NOTE — Evaluation (Addendum)
Occupational Therapy Evaluation Patient Details Name: Deanna [name] MRN: [id number] DOB: Mar 04, 1946 Today's Date: 09/05/2017    History of Present Illness 72 yo female admitted with decreased oral intake and difficulty ambulating. Hx of advanced dementia. Pt is from an ALF memory care   Clinical Impression   Pt was admitted for the above.  Per chart, she was independent with ambulating:  Unsure of ADL assistance. Will trial OT, 1x week to see if she will benefit, focusing on standing for adls and toilet transfers    Follow Up Recommendations  SNF VS ALF, if facility can manage level of care   Equipment Recommendations  (? 3:1)    Recommendations for Other Services       Precautions / Restrictions Precautions Precautions: Fall Restrictions Weight Bearing Restrictions: No      Mobility Bed Mobility Overal bed mobility: Needs Assistance Bed Mobility: Supine to Sit;Sit to Supine     Supine to sit: Total assist;+2 for physical assistance;+2 for safety/equipment Sit to supine: Total assist;+2 for physical assistance;+2 for safety/equipment   General bed mobility comments: Pt did not follow commands or initiate task(s). Assist required for trunk and bil LEs. Utilized bedpad to position pt at EOB  Transfers Overall transfer level: Needs assistance Equipment used: 2 person hand held assist Transfers: Sit to/from Stand Sit to Stand: Mod assist;+2 physical assistance;+2 safety/equipment;From elevated surface         General transfer comment: Assist to rise, stabilize, control descent. Again pt did not follow commands or initiate task.     Balance Overall balance assessment: Needs assistance         Standing balance support: Bilateral upper extremity supported Standing balance-Leahy Scale: Poor                             ADL either performed or assessed with clinical judgement   ADL Overall ADL's : Needs assistance/impaired                                        General ADL Comments: total A for all adls; +2 for sit to stand.  Pt spontaneously reached to scratch face but resisted hand over hand assist to wash face; did not initiate on her own. Also did not self feed per NT     Vision         Perception     Praxis      Pertinent Vitals/Pain Pain Assessment: Faces Faces Pain Scale: No hurt     Hand Dominance     Extremity/Trunk Assessment Upper Extremity Assessment Upper Extremity Assessment: Generalized weakness   Lower Extremity Assessment Lower Extremity Assessment: Generalized weakness(able to weightbear in standing)   Cervical / Trunk Assessment Cervical / Trunk Assessment: Normal   Communication Communication Communication: (little verbalization)   Cognition Arousal/Alertness: Lethargic Behavior During Therapy: Flat affect Overall Cognitive Status: History of cognitive impairments - at baseline                                     General Comments  kept eyes closed most of session    Exercises     Shoulder Instructions      Home Living Family/patient expects to be discharged to:: Skilled nursing facility     Type of Home:  Assisted living                           Additional Comments: pt is unable to provide info      Prior Functioning/Environment Level of Independence: Needs assistance  Gait / Transfers Assistance Needed: unsure of PLOF. Per chart, pt was ambulatory at ALF .               OT Problem List: Decreased strength;Decreased activity tolerance;Impaired balance (sitting and/or standing);Decreased cognition      OT Treatment/Interventions:      OT Goals(Current goals can be found in the care plan section) Acute Rehab OT Goals Patient Stated Goal: pt unable to state OT Goal Formulation: Patient unable to participate in goal setting Time For Goal Achievement: 09/19/17 Potential to Achieve Goals: Fair ADL Goals Pt Will Transfer to Toilet:  with min assist;bedside commode;stand pivot transfer Additional ADL Goal #1: pt will go from sit to stand with min A for adls  OT Frequency:     Barriers to D/C:            Co-evaluation              AM-PAC PT "6 Clicks" Daily Activity     Outcome Measure Help from another person eating meals?: Total Help from another person taking care of personal grooming?: Total Help from another person toileting, which includes using toliet, bedpan, or urinal?: Total Help from another person bathing (including washing, rinsing, drying)?: Total Help from another person to put on and taking off regular upper body clothing?: Total Help from another person to put on and taking off regular lower body clothing?: Total 6 Click Score: 6   End of Session    Activity Tolerance: Patient limited by lethargy Patient left: in bed;with call bell/phone within reach;with bed alarm set  OT Visit Diagnosis: Muscle weakness (generalized) (M62.81);Unsteadiness on feet (R26.81);Cognitive communication deficit (R41.841)                Time: 4098-1191 OT Time Calculation (min): 15 min Charges:  OT General Charges $OT Visit: 1 Visit OT Evaluation $OT Eval Low Complexity: 1 Low G-Codes:     [place], OTR/L [phone] 09/05/2017  Kenia Teagarden 09/05/2017, 11:32 AM

## 2017-09-05 NOTE — Progress Notes (Signed)
PROGRESS NOTE    Deanna [name]  FOY:[id number] DOB: 03-25-1945 DOA: 09/04/2017 PCP: System, Pcp Not In  Brief Narrative: Deanna [name] is a 72 y.o. female with medical history significant of advanced dementia, hypertension and hyperlipidemia was sent from nursing home because of altered mental status.      Assessment & Plan:   Active Problems:   HTN (hypertension)   Dementia   Altered mental status, unspecified   ACUTE ENCEPHALOPATHY:  Of unclear etiology.  differential include ? Metabolic from hypernatremia from dehydration vs worsening of her dementia vs UTI.  Initial CT head negative, MRI brain ordered.  Ammonia level wnl.  B12 AND TSH wnl.  Start treating the infection.  Gently hydrate.  PT and palliative care consult.  Nutrition consulted.    UTI: Urine cultures pending on rocephin currently.    Dementia:  Not agitated.    Hypertension;  Well controlled.    Hypernatremia: from dehydration and decreased po intake.  Hydrate and repeat BMP in am.   Mild leukocytosis  Resolved.    Thrombocytosis: Probably from UTI.     AKI;  Improved with fluids.     DVT prophylaxis: lovenox.  Code Status: DNR Family Communication: (Specify name, relationship & date discussed. NO "discussed with patient") Disposition Plan: PENDING CLINICAL IMPROVEMENT.   Consultants:   Palliative care   Procedures: MRI brain ordered.   Antimicrobials: Rocephin.   Subjective: Pt is pleasant, but not answering any questions.   Objective: Vitals:   09/04/17 1733 09/04/17 1739 09/04/17 2233 09/05/17 0557  BP: (!) 154/85  (!) 146/74 (!) 155/75  Pulse: (!) 49  72 67  Resp: 17  17 18   Temp: 98.2 F (36.8 C)  98.2 F (36.8 C) (!) 97.4 F (36.3 C)  TempSrc: Oral  Oral Oral  SpO2: 96%  100% (!) 89%  Weight:  52.3 kg (115 lb 4.8 oz)    Height:  5\' 6"  (1.676 m)      Intake/Output Summary (Last 24 hours) at 09/05/2017 1021 Last data filed at 09/05/2017 0439 Gross per 24 hour    Intake 2236.87 ml  Output -  Net 2236.87 ml   Filed Weights   09/04/17 1739  Weight: 52.3 kg (115 lb 4.8 oz)    Examination:  General exam: Appears calm and comfortable  Respiratory system: Clear to auscultation. Respiratory effort normal. Cardiovascular system: S1 & S2 heard, RRR. No JVD, No pedal edema. Gastrointestinal system: Abdomen is nondistended, soft and nontender. No organomegaly or masses felt. Normal bowel sounds heard. Central nervous system: Alert but unable to assess as she is not following commands or answering any questions.  Extremities: Symmetric 5 x 5 power. Skin: No rashes, lesions or ulcers Psychiatry: calm .     Data Reviewed: I have personally reviewed following labs and imaging studies  CBC: Recent Labs  Lab 08/31/17 1901 09/04/17 1215 09/05/17 0429  WBC 8.6 10.6* 8.6  NEUTROABS 6.1 7.7  --   HGB 13.4 14.6 13.1  HCT 41.6 46.1* 41.7  MCV 87.9 89.5 89.3  PLT 590* 666* 767*   Basic Metabolic Panel: Recent Labs  Lab 08/31/17 1901 09/04/17 1215 09/05/17 0429  NA 147* 145 149*  K 3.8 5.0 4.0  CL 107 104 111  CO2 30 33* 29  GLUCOSE 106* 102* 85  BUN 15 28* 22  CREATININE 0.88 1.04* 0.83  CALCIUM 9.2 9.2 9.0   GFR: Estimated Creatinine Clearance: 52.1 mL/min (by C-G formula based on SCr of 0.83 mg/dL). Liver  Function Tests: Recent Labs  Lab 08/31/17 1901 09/04/17 1215 09/05/17 0429  AST 63* 42* 32  ALT 29 22 22   ALKPHOS 69 76 68  BILITOT 0.9 1.2 0.8  PROT 6.8 7.6 6.9  ALBUMIN 4.0 4.1 3.5   No results for input(s): LIPASE, AMYLASE in the last 168 hours. Recent Labs  Lab 09/04/17 1415  AMMONIA 11   Coagulation Profile: No results for input(s): INR, PROTIME in the last 168 hours. Cardiac Enzymes: No results for input(s): CKTOTAL, CKMB, CKMBINDEX, TROPONINI in the last 168 hours. BNP (last 3 results) No results for input(s): PROBNP in the last 8760 hours. HbA1C: No results for input(s): HGBA1C in the last 72  hours. CBG: No results for input(s): GLUCAP in the last 168 hours. Lipid Profile: No results for input(s): CHOL, HDL, LDLCALC, TRIG, CHOLHDL, LDLDIRECT in the last 72 hours. Thyroid Function Tests: Recent Labs    09/05/17 0429  TSH 1.161   Anemia Panel: Recent Labs    09/05/17 0429  VITAMINB12 1,007*   Sepsis Labs: No results for input(s): PROCALCITON, LATICACIDVEN in the last 168 hours.  Recent Results (from the past 240 hour(s))  MRSA PCR Screening     Status: None   Collection Time: 09/05/17 12:35 AM  Result Value Ref Range Status   MRSA by PCR NEGATIVE NEGATIVE Final    Comment:        The GeneXpert MRSA Assay (FDA approved for NASAL specimens only), is one component of a comprehensive MRSA colonization surveillance program. It is not intended to diagnose MRSA infection nor to guide or monitor treatment for MRSA infections. Performed at [facility], [place] [address] [place], [place] [address]          Radiology Studies: Ct Head Wo Contrast  Result Date: 09/04/2017 CLINICAL DATA:  Decreased p.o. intake.  Dementia.  Nonverbal. EXAM: CT HEAD WITHOUT CONTRAST TECHNIQUE: Contiguous axial images were obtained from the base of the skull through the vertex without intravenous contrast. COMPARISON:  08/31/2017. FINDINGS: Brain: No acute stroke, hemorrhage, mass lesion, or extra-axial fluid. Generalized atrophy. Hydrocephalus ex vacuo. Old RIGHT temporal lobe infarct. Old LEFT basal ganglia infarct. Hypoattenuation of white matter, likely small vessel disease. Vascular: Calcification of the cavernous internal carotid arteries consistent with cerebrovascular atherosclerotic disease. No signs of intracranial large vessel occlusion. Skull: No worrisome osseous lesions. Sinuses/Orbits: Clear sinuses.  No orbital findings of significance. Other: No middle ear or mastoid fluid. IMPRESSION: Atrophy and small vessel disease. Areas of remote cerebral infarction. No  acute intracranial findings. No significant change from 06/29. Electronically Signed   By: Staci Righter M.D.   On: 09/04/2017 14:08   Dg Chest Port 1 View  Result Date: 09/04/2017 CLINICAL DATA:  Decreased p.o. intake EXAM: PORTABLE CHEST 1 VIEW COMPARISON:  08/31/2017 FINDINGS: The heart size and mediastinal contours are within normal limits. Both lungs are clear. The visualized skeletal structures are unremarkable. IMPRESSION: No active disease. Electronically Signed   By: Kathreen Devoid   On: 09/04/2017 13:31        Scheduled Meds: . buPROPion  150 mg Oral Daily  . enoxaparin (LOVENOX) injection  40 mg Subcutaneous Q24H  . feeding supplement (ENSURE ENLIVE)  237 mL Oral BID BM  . lactase  9,000 Units Oral TID AC  . mirtazapine  15 mg Oral QHS  . multivitamin with minerals  1 tablet Oral Daily  . OLANZapine  5 mg Oral QHS  . senna-docusate  1 tablet Oral BID  .  sertraline  100 mg Oral Daily  . simvastatin  40 mg Oral q1800   Continuous Infusions: . sodium chloride 100 mL/hr at 09/04/17 1717  . cefTRIAXone (ROCEPHIN)  IV Stopped (09/04/17 1748)     LOS: 0 days    Time spent: 30 minutes.     Hosie Poisson, MD [facility] Pager [phone]  If 7PM-7AM, please contact night-coverage [email or web address] Password [email or web address] 09/05/2017, 10:21 AM

## 2017-09-06 ENCOUNTER — Observation Stay (HOSPITAL_COMMUNITY): Payer: Medicare Other

## 2017-09-06 ENCOUNTER — Encounter (HOSPITAL_COMMUNITY): Payer: Self-pay

## 2017-09-06 ENCOUNTER — Other Ambulatory Visit: Payer: Self-pay

## 2017-09-06 DIAGNOSIS — I10 Essential (primary) hypertension: Secondary | ICD-10-CM | POA: Diagnosis present

## 2017-09-06 DIAGNOSIS — Z79899 Other long term (current) drug therapy: Secondary | ICD-10-CM | POA: Diagnosis not present

## 2017-09-06 DIAGNOSIS — G309 Alzheimer's disease, unspecified: Secondary | ICD-10-CM | POA: Diagnosis present

## 2017-09-06 DIAGNOSIS — B9620 Unspecified Escherichia coli [E. coli] as the cause of diseases classified elsewhere: Secondary | ICD-10-CM | POA: Diagnosis present

## 2017-09-06 DIAGNOSIS — Z681 Body mass index (BMI) 19 or less, adult: Secondary | ICD-10-CM | POA: Diagnosis not present

## 2017-09-06 DIAGNOSIS — R4182 Altered mental status, unspecified: Secondary | ICD-10-CM | POA: Diagnosis not present

## 2017-09-06 DIAGNOSIS — E870 Hyperosmolality and hypernatremia: Secondary | ICD-10-CM | POA: Diagnosis present

## 2017-09-06 DIAGNOSIS — F0280 Dementia in other diseases classified elsewhere without behavioral disturbance: Secondary | ICD-10-CM

## 2017-09-06 DIAGNOSIS — E440 Moderate protein-calorie malnutrition: Secondary | ICD-10-CM | POA: Diagnosis present

## 2017-09-06 DIAGNOSIS — N390 Urinary tract infection, site not specified: Secondary | ICD-10-CM | POA: Diagnosis present

## 2017-09-06 DIAGNOSIS — E860 Dehydration: Secondary | ICD-10-CM | POA: Diagnosis present

## 2017-09-06 DIAGNOSIS — Z91018 Allergy to other foods: Secondary | ICD-10-CM | POA: Diagnosis not present

## 2017-09-06 DIAGNOSIS — G9340 Encephalopathy, unspecified: Secondary | ICD-10-CM | POA: Diagnosis present

## 2017-09-06 DIAGNOSIS — E785 Hyperlipidemia, unspecified: Secondary | ICD-10-CM | POA: Diagnosis present

## 2017-09-06 DIAGNOSIS — N179 Acute kidney failure, unspecified: Secondary | ICD-10-CM | POA: Diagnosis present

## 2017-09-06 DIAGNOSIS — R627 Adult failure to thrive: Secondary | ICD-10-CM | POA: Diagnosis present

## 2017-09-06 DIAGNOSIS — Z7189 Other specified counseling: Secondary | ICD-10-CM

## 2017-09-06 DIAGNOSIS — B964 Proteus (mirabilis) (morganii) as the cause of diseases classified elsewhere: Secondary | ICD-10-CM | POA: Diagnosis present

## 2017-09-06 DIAGNOSIS — F0281 Dementia in other diseases classified elsewhere with behavioral disturbance: Secondary | ICD-10-CM | POA: Diagnosis present

## 2017-09-06 DIAGNOSIS — F0390 Unspecified dementia without behavioral disturbance: Secondary | ICD-10-CM | POA: Diagnosis not present

## 2017-09-06 DIAGNOSIS — Z66 Do not resuscitate: Secondary | ICD-10-CM | POA: Diagnosis present

## 2017-09-06 DIAGNOSIS — D473 Essential (hemorrhagic) thrombocythemia: Secondary | ICD-10-CM | POA: Diagnosis present

## 2017-09-06 DIAGNOSIS — R296 Repeated falls: Secondary | ICD-10-CM | POA: Diagnosis present

## 2017-09-06 LAB — FOLATE RBC
FOLATE, RBC: 985 ng/mL (ref >498)
Folate, Hemolysate: 398.1 ng/mL
Hematocrit: 40.4 % (ref 34.0–46.6)

## 2017-09-06 LAB — HIV ANTIBODY (ROUTINE TESTING W REFLEX): HIV Screen 4th Generation wRfx: NONREACTIVE

## 2017-09-06 NOTE — Progress Notes (Signed)
PROGRESS NOTE    Deanna [name]  VHQ:[id number] DOB: [birth year] DOA: 09/04/2017 PCP: System, Pcp Not In  Brief Narrative: Deanna [name] is a 72 y.o. female with medical history significant of advanced dementia, hypertension and hyperlipidemia was sent from nursing home because of altered mental status.      Assessment & Plan:   Active Problems:   HTN (hypertension)   Dementia   Altered mental status, unspecified   Malnutrition of moderate degree   Dehydration   Advance care planning   Goals of care, counseling/discussion   ACUTE ENCEPHALOPATHY:  Of unclear etiology.  differential include ? Metabolic from hypernatremia from dehydration vs worsening of her dementia vs UTI.  Initial CT head negative, MRI brain showed features significant for dementia. No acute stroke.  Ammonia level wnl.  B12 AND TSH wnl.  Start treating the infection.  Gently hydrate.  PT and palliative care consult and recommendations appreciated. .  Nutrition consulted.    UTI: Urine cultures pending and showed gram neg rods. Identification pending.  on rocephin currently.    Dementia:  Not agitated.    Hypertension;  Well controlled.    Hypernatremia: from dehydration and decreased po intake.  Hydrate and repeat BMP in am.   Mild leukocytosis  Resolved.    Thrombocytosis: Probably from UTI.     AKI;  Improved with fluids.     DVT prophylaxis: lovenox.  Code Status: DNR Family Communication: discussed with son over the phone.  Disposition Plan: PENDING CLINICAL IMPROVEMENT.   Consultants:   Palliative care   Procedures: MRI brain ordered.   Antimicrobials: Rocephin.   Subjective: No distress. Pleasant.   Objective: Vitals:   09/05/17 1258 09/05/17 2109 09/06/17 0526 09/06/17 1446  BP: 128/71 110/64 127/70 139/78  Pulse: 92 75 80 88  Resp: 16 16 18 12   Temp: (!) 97.5 F (36.4 C) 98.1 F (36.7 C) 98.6 F (37 C) 99.2 F (37.3 C)  TempSrc: Oral Oral Oral   SpO2: 90%  100% 99% 100%  Weight:      Height:        Intake/Output Summary (Last 24 hours) at 09/06/2017 1528 Last data filed at 09/06/2017 1400 Gross per 24 hour  Intake 3828.33 ml  Output 100 ml  Net 3728.33 ml   Filed Weights   09/04/17 1739  Weight: 52.3 kg (115 lb 4.8 oz)    Examination:  General exam: Appears calm and comfortable , not in distress.  Respiratory system: Clear to auscultation. Respiratory effort normal. No wheezing or rhonchi.  Cardiovascular system: S1 & S2 heard, RRR. No JVD, No pedal edema. Gastrointestinal system: Abdomen is soft NT ND BS+ Central nervous system: Alert , non focal. Not oriented.  Extremities: Symmetric 5 x 5 power. Skin: No rashes, lesions or ulcers Psychiatry: calm .     Data Reviewed: I have personally reviewed following labs and imaging studies  CBC: Recent Labs  Lab 08/31/17 1901 09/04/17 1215 09/05/17 0429  WBC 8.6 10.6* 8.6  NEUTROABS 6.1 7.7  --   HGB 13.4 14.6 13.1  HCT 41.6 46.1* 41.7  40.4  MCV 87.9 89.5 89.3  PLT 590* 666* 413*   Basic Metabolic Panel: Recent Labs  Lab 08/31/17 1901 09/04/17 1215 09/05/17 0429  NA 147* 145 149*  K 3.8 5.0 4.0  CL 107 104 111  CO2 30 33* 29  GLUCOSE 106* 102* 85  BUN 15 28* 22  CREATININE 0.88 1.04* 0.83  CALCIUM 9.2 9.2 9.0   GFR: Estimated  Creatinine Clearance: 52.1 mL/min (by C-G formula based on SCr of 0.83 mg/dL). Liver Function Tests: Recent Labs  Lab 08/31/17 1901 09/04/17 1215 09/05/17 0429  AST 63* 42* 32  ALT 29 22 22   ALKPHOS 69 76 68  BILITOT 0.9 1.2 0.8  PROT 6.8 7.6 6.9  ALBUMIN 4.0 4.1 3.5   No results for input(s): LIPASE, AMYLASE in the last 168 hours. Recent Labs  Lab 09/04/17 1415  AMMONIA 11   Coagulation Profile: No results for input(s): INR, PROTIME in the last 168 hours. Cardiac Enzymes: No results for input(s): CKTOTAL, CKMB, CKMBINDEX, TROPONINI in the last 168 hours. BNP (last 3 results) No results for input(s): PROBNP in the last  8760 hours. HbA1C: No results for input(s): HGBA1C in the last 72 hours. CBG: No results for input(s): GLUCAP in the last 168 hours. Lipid Profile: No results for input(s): CHOL, HDL, LDLCALC, TRIG, CHOLHDL, LDLDIRECT in the last 72 hours. Thyroid Function Tests: Recent Labs    09/05/17 0429  TSH 1.161   Anemia Panel: Recent Labs    09/05/17 0429  VITAMINB12 1,007*   Sepsis Labs: No results for input(s): PROCALCITON, LATICACIDVEN in the last 168 hours.  Recent Results (from the past 240 hour(s))  Urine culture     Status: Abnormal (Preliminary result)   Collection Time: 09/04/17  4:02 PM  Result Value Ref Range Status   Specimen Description   Final    URINE, CLEAN CATCH Performed at [facility], [place] [address], [place], [place] [address]    Special Requests   Final    NONE Performed at [facility], [place] [address], [place], [place] [address]    Culture (A)  Final    >=100,000 COLONIES/mL PROTEUS MIRABILIS >=100,000 COLONIES/mL ESCHERICHIA COLI SUSCEPTIBILITIES TO FOLLOW Performed at [place] [address] [place], [place] [address]    Report Status PENDING  Incomplete  MRSA PCR Screening     Status: None   Collection Time: 09/05/17 12:35 AM  Result Value Ref Range Status   MRSA by PCR NEGATIVE NEGATIVE Final    Comment:        The GeneXpert MRSA Assay (FDA approved for NASAL specimens only), is one component of a comprehensive MRSA colonization surveillance program. It is not intended to diagnose MRSA infection nor to guide or monitor treatment for MRSA infections. Performed at [facility], [place] [address], [place], [place] [address]          Radiology Studies: Mr Brain Wo Contrast  Result Date: 09/06/2017 CLINICAL DATA:  Altered level of consciousness.  Evaluate for CVA EXAM: MRI HEAD WITHOUT CONTRAST TECHNIQUE: Multiplanar, multiecho pulse sequences of the brain and surrounding  structures were obtained without intravenous contrast. COMPARISON:  Head CT from 2 days ago.  Brain MRI 04/06/2014 FINDINGS: Brain: No acute infarction, hemorrhage, hydrocephalus, extra-axial collection or mass lesion. Atrophy, advanced in the medial temporal lobes, significantly progressed from 2016. Moderate chronic small vessel ischemic gliosis in the cerebral white matter. Remote small vessel infarct in the left corona radiata and left cerebellum. Vascular: Major flow voids are preserved Skull and upper cervical spine: No evidence of marrow lesion Sinuses/Orbits: Negative IMPRESSION: 1. No acute finding. 2. History of dementia with advanced and progressive medial temporal atrophy suggesting Alzheimer's. 3. Moderate chronic small vessel ischemia. Electronically Signed   By: Monte Fantasia M.D.   On: 09/06/2017 08:04        Scheduled Meds: . buPROPion  150 mg Oral Daily  . enoxaparin (LOVENOX) injection  40 mg Subcutaneous Q24H  . feeding supplement (ENSURE ENLIVE)  237 mL Oral TID BM  . lactase  9,000 Units Oral TID AC  . mirtazapine  15 mg Oral QHS  . multivitamin with minerals  1 tablet Oral Daily  . OLANZapine  5 mg Oral QHS  . senna-docusate  1 tablet Oral BID  . sertraline  100 mg Oral Daily  . simvastatin  40 mg Oral q1800   Continuous Infusions: . cefTRIAXone (ROCEPHIN)  IV Stopped (09/05/17 2158)  . dextrose 5 % and 0.9% NaCl 100 mL/hr (09/06/17 0410)     LOS: 0 days    Time spent: 30 minutes.     Hosie Poisson, MD [facility] Pager [phone]  If 7PM-7AM, please contact night-coverage [email or web address] Password [email or web address] 09/06/2017, 3:28 PM

## 2017-09-06 NOTE — Clinical Social Work Note (Signed)
Clinical Social Work Assessment  Patient Details  Name: Deanna [name] MRN: [id number] Date of Birth: 01/23/1946  Date of referral:  09/06/17               Reason for consult:  Facility Placement, Discharge Planning                Permission sought to share information with:    Permission granted to share information::     Name::        Agency::     Relationship::     Contact Information:     Housing/Transportation Living arrangements for the past 2 months:  Assisted Living Facility([facility]) Source of Information:  Adult Children Patient Interpreter Needed:  None Criminal Activity/Legal Involvement Pertinent to Current Situation/Hospitalization:  No - Comment as needed Significant Relationships:  Adult Children Lives with:  Facility Resident Do you feel safe going back to the place where you live?    Need for family participation in patient care:  Yes (Comment)  Care giving concerns:  Patient from [place]. Patient's son reported that patient was independent with ambulation and was able to feed self before getting sick. PT recommending "SNF;Supervision/Assistance - 24 hour(unless ALF can provide current level of care)".    Social Worker assessment / plan:  CSW spoke with patient's son Deanna [name] [phone]) regarding patient's discharge planning. Patient's son reported that the plan is for patient to return to [facility] if they are able to manage patient's care. Patient's son reported that patient is currently private paying for ALF and no longer has Medicaid. Patient's son inquired about different levels of car and payor sources, CSW provided psychoeducation about different levels of care and Medicare coverage. Patient's son verbalized understanding and reported that the plan is for patient to return to [facility].  CSW contacted [facility] and spoke with staff member Mardene Celeste. CSW informed staff about current care needs, staff  confirmed ability to meet patient's current care needs. Staff reported that patient is able to return when medically stable.   CSW will complete FL2 and send clinicals to ALF when patient is ready for discharge.  CSW will continue to follow and assist with discharge planning.  Employment status:  Retired Forensic scientist:  [employer] PT Recommendations:  Deanna [name], [place] / Referral to community resources:  Other (Comment Required)(PAtient is Deanna [name] term care resident at [facility])  Patient/Family's Response to care:  Patient's son appreciative of CSW assistance with discharge planning.   Patient/Family's Understanding of and Emotional Response to Diagnosis, Current Treatment, and Prognosis:  Patient's son involved in patient's care and verbalized understanding of current treatment plan. Patient's son verbalized plan for patient to return to [facility] as Deanna [name] as her needs can be met at the ALF.  Emotional Assessment Appearance:    Attitude/Demeanor/Rapport:  Unable to Assess Affect (typically observed):  Unable to Assess Orientation:    Alcohol / Substance use:  Not Applicable Psych involvement (Current and /or in the community):  No (Comment)  Discharge Needs  Concerns to be addressed:  Care Coordination Readmission within the last 30 days:  No Current discharge risk:  Dependent with Mobility Barriers to Discharge:  Continued Medical Work up   [employer], LCSW 09/06/2017, 12:39 PM

## 2017-09-06 NOTE — Consult Note (Signed)
Consultation Note Date: 09/06/2017   Patient Name: Deanna [name]  DOB: 1945/05/25  MRN: [id number]  Age / Sex: 72 y.o., female  PCP: System, Pcp Not In Referring Physician: Hosie Poisson, MD  Reason for Consultation: Establishing goals of care  HPI/Patient Profile: 72 y.o. female  with past medical history of Alzheimer's dementia , HTN, admitted on 09/04/2017 with altered mental status. Workup reveals urinary tract infection. Palliative medicine consulted for [place].   Clinical Assessment and Goals of Care:  I have reviewed medical records including EPIC notes, labs and imaging, assessed the patient and then met at the bedside along with patient's son- Deanna [name]  to discuss diagnosis prognosis, [place], EOL wishes, disposition and options.  I introduced Palliative Medicine as specialized medical care for people living with serious illness. It focuses on providing relief from the symptoms and stress of a serious illness. The goal is to improve quality of life for both the patient and the family.  We discussed a brief life review of the patient. She is retired from working in the [employer]. She lived for a while in [place] and then moved to [place] to live with "Mo" so that he could care for her. She has been living at [facility] assisted living.   As far as functional and nutritional status- Mo has noticed some changes over the last several months. She is losing weight. She is forgetting how to use her fork, but she continues to enjoy eating and continues to be able to eat. She has been falling frequently and has had several ED visits for falls in the last month. She requires assistance with all ADL's. He is unable to understand any of her speech.    We discussed their current illness and what it means in the larger context of their on-going co-morbidities.  Natural disease trajectory and expectations at EOL  were discussed. I reviewed with Mo the overall trajectory of Alzheimer's dementia. We looked at her current state and the concern that she may be nearing end stage and thus EOL.   The difference between aggressive medical intervention and comfort care was considered in light of the patient's goals of care.   Advanced directives, concepts specific to code status, artifical feeding and hydration, and rehospitalization were considered and discussed. Patient is DNR. She has advance directives indicating NO FEEDING TUBE. MOST form was reviewed- Mo indicated that patient would most likely want limited medical interventions- with antibiotics given for UTI, and he wants her to continue to receive IV fluids for dehydration for now.   Hospice and Palliative Care services outpatient were explained and offered.  Questions and concerns were addressed.  Hard Choices booklet left for review. The family was encouraged to call with questions or concerns.   Primary Decision Maker HCPOA- patient's son- Peck -DNR -Continue current level of care -Mo is considering d/c with Hospice- if he chooses to change GOC to comfort only and patients mental status does not improve and she  does not begin to eat, then residential hospice would be appropriate. However, if her mental status improves with treatment of her UTI- then D/C to SNF with Hospice would be appropriate if family desires.  -If family chooses continued return to hospital then Mo would like Palliative to follow -Today was first discussion about patient possibly being at EOL, thus no decisions were made -PMT will f/u with Mo  -Patient is Muslim- will reflect in dining orders- no pork products, no gelatin -No heparin d/t patient is Muslim   Code Status/Advance Care Planning:  DNR  Palliative Prophylaxis:   Aspiration and Delirium Protocol  Additional Recommendations (Limitations, Scope, Preferences):  No Artificial  Feeding  Psycho-social/Spiritual:   Desire for further Chaplaincy support:no  Prognosis:    Unable to determine   Discharge Planning: To Be Determined  Primary Diagnoses: Present on Admission: . Dementia . Altered mental status, unspecified . HTN (hypertension)   I have reviewed the medical record, interviewed the patient and family, and examined the patient. The following aspects are pertinent.  Past Medical History:  Diagnosis Date  . Dementia Dx 2015  . Hyperlipidemia DX 2015  . Hypertension Dx 2015   Social History   Socioeconomic History  . Marital status: Widowed    Spouse name: Not on [name]  . Number of children: 2   . Years of education: GED   . Highest education level: Not on [name]  Occupational History  . 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 Financial resource strain: Not on [name]  . Food insecurity:    Worry: Not on [name]    Inability: Not on [name]  . Transportation needs:    Medical: Not on [name]    Non-medical: Not on [name]  Tobacco Use  . Smoking status: Never Smoker  . Smokeless tobacco: Never Used  Substance and Sexual Activity  . Alcohol use: No  . Drug use: No  . Sexual activity: Never  Lifestyle  . Physical activity:    Days per week: Not on [name]    Minutes per session: Not on [name]  . Stress: Not on [name]  Relationships  . Social connections:    Talks on phone: Not on [name]    Gets together: Not on [name]    Attends religious service: Not on [name]    Active member of club or organization: Not on [name]    Attends meetings of clubs or organizations: Not on [name]    Relationship status: Not on [name]  Other Topics Concern  . Not on [name]  Social History Narrative   From [place].    Lives with son [name] moved from [place] to [place] in 11/2013.    Patient is right handed    Patient has 2 children    Patient has some college education    Patient is retired    Family  History  Problem Relation Age of Onset  . Cancer Neg Hx   . Heart disease Neg Hx   . Dementia Neg Hx    Scheduled Meds: . buPROPion  150 mg Oral Daily  . enoxaparin (LOVENOX) injection  40 mg Subcutaneous Q24H  . feeding supplement (ENSURE ENLIVE)  237 mL Oral TID BM  . lactase  9,000 Units Oral TID AC  . mirtazapine  15 mg Oral QHS  . multivitamin with minerals  1 tablet Oral Daily  . OLANZapine  5 mg  Oral QHS  . senna-docusate  1 tablet Oral BID  . sertraline  100 mg Oral Daily  . simvastatin  40 mg Oral q1800   Continuous Infusions: . cefTRIAXone (ROCEPHIN)  IV Stopped (09/05/17 2158)  . dextrose 5 % and 0.9% NaCl 100 mL/hr (09/06/17 0410)   PRN Meds:.acetaminophen **OR** acetaminophen, ondansetron **OR** ondansetron (ZOFRAN) IV, polyethylene glycol Medications Prior to Admission:  Prior to Admission medications   Medication Sig Start Date End Date Taking? Authorizing Provider  buPROPion (WELLBUTRIN XL) 150 MG 24 hr tablet Take 1 tablet (150 mg total) by mouth daily. 10/19/15 09/04/17 Yes Noel, Tiffany S, PA-C  lactase (LACTAID) 3000 units tablet Take 9,000 Units by mouth 3 (three) times daily before meals.   Yes [provider]  mirtazapine (REMERON) 15 MG tablet Take 15 mg by mouth at bedtime.   Yes [provider]  Multiple Vitamin (THERA) TABS Take 1 tablet by mouth daily.    Yes [provider]  OLANZapine (ZYPREXA) 5 MG tablet Take 1 tablet (5 mg total) by mouth at bedtime. 11/03/15  Yes Funches, Josalyn, MD  polyethylene glycol (MIRALAX / GLYCOLAX) packet Take 17 g by mouth daily as needed (constipation).   Yes [provider]  senna-docusate (SENOKOT-S) 8.6-50 MG tablet Take 1 tablet by mouth 2 (two) times daily.   Yes [provider]  sertraline (ZOLOFT) 100 MG tablet Take 100 mg by mouth daily.   Yes [provider]  simvastatin (ZOCOR) 40 MG tablet Take 40 mg by mouth 2 (two) times daily.   Yes [provider]    sertraline (ZOLOFT) 100 MG tablet Take 1 tablet (100 mg total) by mouth daily. Patient not taking: Reported on 09/04/2017 10/19/15 08/23/17  Brayton Caves, PA-C   No Known Allergies Review of Systems  Physical Exam  Vital Signs: BP 127/70 (BP Location: Left Arm)   Pulse 80   Temp 98.6 F (37 C) (Oral)   Resp 18   Ht '5\' 6"'$  (1.676 m)   Wt 52.3 kg (115 lb 4.8 oz)   SpO2 99%   BMI 18.61 kg/m  Pain Scale: PAINAD       SpO2: SpO2: 99 % O2 Device:SpO2: 99 % O2 Flow Rate: .   IO: Intake/output summary:   Intake/Output Summary (Last 24 hours) at 09/06/2017 1418 Last data filed at 09/06/2017 1030 Gross per 24 hour  Intake 2788.33 ml  Output 100 ml  Net 2688.33 ml    LBM: Last BM Date: (uta) Baseline Weight: Weight: 52.3 kg (115 lb 4.8 oz) Most recent weight: Weight: 52.3 kg (115 lb 4.8 oz)     Palliative Assessment/Data: PPS: 20%     Thank you for this consult. Palliative medicine will continue to follow and assist as needed.   Time In: 1300 Time Out: 1430 Time Total: 90 minutes Greater than 50%  of this time was spent counseling and coordinating care related to the above assessment and plan.  Signed by: Mariana Kaufman, AGNP-C Palliative Medicine    Please contact Palliative Medicine Team phone at [phone] for questions and concerns.  For individual provider: See Shea Evans

## 2017-09-07 LAB — URINE CULTURE: Culture: >=100000 — AB

## 2017-09-07 LAB — BASIC METABOLIC PANEL WITH GFR
Anion gap: 9 (ref 5–15)
BUN: 11 mg/dL (ref 8–23)
CO2: 30 mmol/L (ref 22–32)
Calcium: 8.7 mg/dL — ABNORMAL LOW (ref 8.9–10.3)
Chloride: 104 mmol/L (ref 98–111)
Creatinine, Ser: 0.57 mg/dL (ref 0.44–1.00)
GFR calc Af Amer: >60 mL/min
GFR calc non Af Amer: >60 mL/min
Glucose, Bld: 120 mg/dL — ABNORMAL HIGH (ref 70–99)
Potassium: 3.9 mmol/L (ref 3.5–5.1)
Sodium: 143 mmol/L (ref 135–145)

## 2017-09-07 MED ORDER — CEPHALEXIN 500 MG PO CAPS: 500.0000 mg | ORAL_CAPSULE | Freq: Two times a day (BID) | ORAL | 0 refills | Status: DC

## 2017-09-07 MED ORDER — ENSURE ENLIVE PO LIQD: 237.0000 mL | Freq: Three times a day (TID) | ORAL | 12 refills | Status: DC

## 2017-09-07 NOTE — Progress Notes (Signed)
Pt discharged to [facility]. Pt's son informed by social work of discharge. No s/s of pain or discomfort.

## 2017-09-07 NOTE — Clinical Social Work Note (Signed)
Patient medically stable for discharge and will return to [facility]. Discharge clinicals transmitted to facility, reviewed and approved. Ms. Regula nurse provided with information to call report. Patient's son Romilda Proby [phone]) contacted and advised of discharge. CSW signing off as no other SW intervention services needed at this time.  Caisen Mangas [place]ens, MSW, LCSW Licensed Clinical Social Worker [place] [phone]

## 2017-09-07 NOTE — NC FL2 (Signed)
[place] LEVEL OF CARE SCREENING TOOL     IDENTIFICATION  Patient Name: Deanna [name] Birthdate: 03/20/1945 Sex: female Admission Date (Current Location): 09/04/2017  [facility] [place] Number:  Herbalist and Address:  [facility],  [place] [address], [place]      Provider Number: [id number]  Attending Physician Name and Address:  Hosie Poisson, MD  Relative Name and Phone Number:  Tarrie Mcmichen - son; [phone]    Current Level of Care: Hospital Recommended Level of Care: Assisted Living [facility]) Prior Approval Number:    Date Approved/Denied:   PASRR Number:    Discharge Plan: Other (Comment)([facility])    Current Diagnoses: Patient Active Problem List   Diagnosis Date Noted  . Dehydration   . Advance care planning   . Goals of care, counseling/discussion   . Malnutrition of moderate degree 09/05/2017  . Altered mental status, unspecified 09/04/2017  . Dementia with behavioral disturbance 08/04/2015  . Increased ammonia level 06/15/2014  . Left flank pain 06/15/2014  . Cognitive changes 03/24/2014  . Fatigue 03/24/2014  . Vitreous floaters of right eye 03/11/2014  . Underweight 03/11/2014  . HTN (hypertension) 12/16/2013  . HLD (hyperlipidemia) 12/16/2013  . Dementia 12/16/2013    Orientation RESPIRATION BLADDER Height & Weight     (Disoriented X 4)  Normal Continent, Incontinent Weight: 115 lb 4.8 oz (52.3 kg) Height:  5\' 6"  (167.6 cm)  BEHAVIORAL SYMPTOMS/MOOD NEUROLOGICAL BOWEL NUTRITION STATUS      Continent Diet(Regular - No pork)  AMBULATORY STATUS COMMUNICATION OF NEEDS Skin   Extensive Assist Verbally Normal                       Personal Care Assistance Level of Assistance  Bathing, Feeding, Dressing Bathing Assistance: Maximum assistance Feeding assistance: Limited assistance Dressing Assistance: Maximum assistance     Functional Limitations Info  Sight,  Hearing, Speech Sight Info: Adequate Hearing Info: Adequate Speech Info: Adequate    SPECIAL CARE FACTORS FREQUENCY  PT (By licensed PT), OT (By licensed OT), Speech therapy     PT Frequency: Evaluated 7/4 OT Frequency: Evaluated 7/4     Speech Therapy Frequency: Evaluated 7/4 - Swallow eval      Contractures Contractures Info: Not present    Additional Factors Info  Code Status, Allergies Code Status Info: DNR Allergies Info: Pork-derived products           Current Medications (09/07/2017):  This is the current hospital active medication list Current Facility-Administered Medications  Medication Dose Route Frequency Provider Last Rate Last Dose  . acetaminophen (TYLENOL) tablet 650 mg  650 mg Oral Q6H PRN Aline August, MD       Or  . acetaminophen (TYLENOL) suppository 650 mg  650 mg Rectal Q6H PRN Starla Link, Kshitiz, MD      . buPROPion (WELLBUTRIN XL) 24 hr tablet 150 mg  150 mg Oral Daily Starla Link, Kshitiz, MD   150 mg at 09/07/17 0908  . cefTRIAXone (ROCEPHIN) 1 g in sodium chloride 0.9 % 100 mL IVPB  1 g Intravenous Q24H Aline August, MD   Stopped at 09/06/17 1937  . dextrose 5 %-0.9 % sodium chloride infusion   Intravenous Continuous Hosie Poisson, MD 100 mL/hr at 09/07/17 0800    . enoxaparin (LOVENOX) injection 40 mg  40 mg Subcutaneous Q24H Aline August, MD   40 mg at 09/06/17 1912  . feeding supplement (ENSURE ENLIVE) (ENSURE ENLIVE) liquid  237 mL  237 mL Oral TID BM Hosie Poisson, MD   237 mL at 09/07/17 1356  . lactase (LACTAID) tablet 9,000 Units  9,000 Units Oral TID Shaune Spittle, MD   9,000 Units at 09/07/17 1204  . mirtazapine (REMERON) tablet 15 mg  15 mg Oral QHS Aline August, MD   15 mg at 09/06/17 2123  . multivitamin with minerals tablet 1 tablet  1 tablet Oral Daily Aline August, MD   1 tablet at 09/07/17 0908  . OLANZapine (ZYPREXA) tablet 5 mg  5 mg Oral QHS Aline August, MD   5 mg at 09/06/17 2123  . ondansetron (ZOFRAN) tablet 4 mg  4 mg  Oral Q6H PRN Aline August, MD       Or  . ondansetron (ZOFRAN) injection 4 mg  4 mg Intravenous Q6H PRN Alekh, Kshitiz, MD      . polyethylene glycol (MIRALAX / GLYCOLAX) packet 17 g  17 g Oral Daily PRN Starla Link, Kshitiz, MD      . senna-docusate (Senokot-S) tablet 1 tablet  1 tablet Oral BID Aline August, MD   1 tablet at 09/07/17 0908  . sertraline (ZOLOFT) tablet 100 mg  100 mg Oral Daily Aline August, MD   100 mg at 09/07/17 0908  . simvastatin (ZOCOR) tablet 40 mg  40 mg Oral q1800 Aline August, MD   40 mg at 09/06/17 1909     Discharge Medications: Please see discharge summary for a list of discharge medications.  Relevant Imaging Results:  Relevant Lab Results:   Additional Information DISCHARGE MEDICATIONS:  TAKE these medications   buPROPion 150 MG 24 hr tablet Commonly known as:  WELLBUTRIN XL Take 1 tablet (150 mg total) by mouth daily.   cephALEXin 500 MG capsule Commonly known as:  KEFLEX Take 1 capsule (500 mg total) by mouth 2 (two) times daily.   feeding supplement (ENSURE ENLIVE) Liqd Take 237 mLs by mouth 3 (three) times daily between meals.   lactase 3000 units tablet Commonly known as:  LACTAID Take 9,000 Units by mouth 3 (three) times daily before meals.   mirtazapine 15 MG tablet Commonly known as:  REMERON Take 15 mg by mouth at bedtime.   OLANZapine 5 MG tablet Commonly known as:  ZYPREXA Take 1 tablet (5 mg total) by mouth at bedtime.   polyethylene glycol packet Commonly known as:  MIRALAX / GLYCOLAX Take 17 g by mouth daily as needed (constipation).   senna-docusate 8.6-50 MG tablet Commonly known as:  Senokot-S Take 1 tablet by mouth 2 (two) times daily.   sertraline 100 MG tablet Commonly known as:  ZOLOFT Take 100 mg by mouth daily. What changed:  Another medication with the same name was removed. Continue taking this medication, and follow the directions you see here.   simvastatin 40 MG tablet Commonly known as:   ZOCOR Take 40 mg by mouth 2 (two) times daily.   THERA Tabs Take 1 tablet by mouth daily.       Sable Feil, LCSW

## 2017-09-07 NOTE — Discharge Summary (Signed)
Physician Discharge Summary  Deanna [name] MHD:[id number] DOB: 07/01/1945 DOA: 09/04/2017  PCP: System, Pcp Not In  Admit date: 09/04/2017 Discharge date: 09/07/2017  Admitted From: Home. Disposition: ALF with PT.   Recommendations for Outpatient Follow-up:  1. Follow up with PCP in 1-2 weeks 2. Please obtain BMP/CBC in one week 3. Please follow up with Palliative care services at the facility.     Discharge Condition:stable.  CODE STATUS: DNR Diet recommendation: Heart Healthy  Brief/Interim Summary: Deanna Hassanis a 72 y.o.femalewith medical history significant ofadvanced dementia, hypertension and hyperlipidemia was sent from nursing home because of altered mental status.   She was found to have E COLI and proteus UTI.   Discharge Diagnoses:  Active Problems:   HTN (hypertension)   Dementia   Altered mental status, unspecified   Malnutrition of moderate degree   Dehydration   Advance care planning   Goals of care, counseling/discussion   ACUTE ENCEPHALOPATHY:   differential include ? Metabolic from hypernatremia from dehydration vs worsening of her dementia vs UTI.  Initial CT head negative, MRI brain showed features significant for dementia. No acute stroke.  Ammonia level wnl.  B12 AND TSH wnl.  Gently hydrated .  PT and palliative care consult and recommendations appreciated.  Nutrition consulted and recommendations given.    UTI: Urine cultures show  E coli and proteus sensitive to keflex. Discharged on oral keflex to complete the course.     Dementia:  Not agitated.    Hypertension;  Well controlled.    Hypernatremia: from dehydration and decreased po intake.  Hydrate and repeat BMP in am shows improvement.    Mild leukocytosis  Resolved.    Thrombocytosis: Probably from UTI.     AKI;  Improved with fluids.       Discharge Instructions  Discharge Instructions    Diet - low sodium heart healthy   Complete by:  As  directed    Discharge instructions   Complete by:  As directed    Follow up with PCP in one week.     Allergies as of 09/07/2017      Reactions   Pork-derived Products       Medication List    TAKE these medications   buPROPion 150 MG 24 hr tablet Commonly known as:  WELLBUTRIN XL Take 1 tablet (150 mg total) by mouth daily.   cephALEXin 500 MG capsule Commonly known as:  KEFLEX Take 1 capsule (500 mg total) by mouth 2 (two) times daily.   feeding supplement (ENSURE ENLIVE) Liqd Take 237 mLs by mouth 3 (three) times daily between meals.   lactase 3000 units tablet Commonly known as:  LACTAID Take 9,000 Units by mouth 3 (three) times daily before meals.   mirtazapine 15 MG tablet Commonly known as:  REMERON Take 15 mg by mouth at bedtime.   OLANZapine 5 MG tablet Commonly known as:  ZYPREXA Take 1 tablet (5 mg total) by mouth at bedtime.   polyethylene glycol packet Commonly known as:  MIRALAX / GLYCOLAX Take 17 g by mouth daily as needed (constipation).   senna-docusate 8.6-50 MG tablet Commonly known as:  Senokot-S Take 1 tablet by mouth 2 (two) times daily.   sertraline 100 MG tablet Commonly known as:  ZOLOFT Take 100 mg by mouth daily. What changed:  Another medication with the same name was removed. Continue taking this medication, and follow the directions you see here.   simvastatin 40 MG tablet Commonly known as:  ZOCOR Take  40 mg by mouth 2 (two) times daily.   THERA Tabs Take 1 tablet by mouth daily.       Allergies  Allergen Reactions  . Pork-Derived Products     Consultations:  Palliative care.    Procedures/Studies: Dg Chest 2 View  Result Date: 08/31/2017 CLINICAL DATA:  Increased weakness EXAM: CHEST - 2 VIEW COMPARISON:  06/15/2017 FINDINGS: Cardiac shadow is within normal limits. The lungs are well aerated bilaterally. No focal infiltrate or sizable effusion is noted. Biapical scarring is noted and stable. No new focal  infiltrate is seen. No bony abnormality is noted. IMPRESSION: No active cardiopulmonary disease. Electronically Signed   By: Inez Catalina M.D.   On: 08/31/2017 18:50   Ct Head Wo Contrast  Result Date: 09/04/2017 CLINICAL DATA:  Decreased p.o. intake.  Dementia.  Nonverbal. EXAM: CT HEAD WITHOUT CONTRAST TECHNIQUE: Contiguous axial images were obtained from the base of the skull through the vertex without intravenous contrast. COMPARISON:  08/31/2017. FINDINGS: Brain: No acute stroke, hemorrhage, mass lesion, or extra-axial fluid. Generalized atrophy. Hydrocephalus ex vacuo. Old RIGHT temporal lobe infarct. Old LEFT basal ganglia infarct. Hypoattenuation of white matter, likely small vessel disease. Vascular: Calcification of the cavernous internal carotid arteries consistent with cerebrovascular atherosclerotic disease. No signs of intracranial large vessel occlusion. Skull: No worrisome osseous lesions. Sinuses/Orbits: Clear sinuses.  No orbital findings of significance. Other: No middle ear or mastoid fluid. IMPRESSION: Atrophy and small vessel disease. Areas of remote cerebral infarction. No acute intracranial findings. No significant change from 06/29. Electronically Signed   By: Staci Righter M.D.   On: 09/04/2017 14:08   Ct Head Wo Contrast  Result Date: 08/31/2017 CLINICAL DATA:  Altered level of consciousness EXAM: CT HEAD WITHOUT CONTRAST TECHNIQUE: Contiguous axial images were obtained from the base of the skull through the vertex without intravenous contrast. COMPARISON:  08/23/2017 FINDINGS: Brain: Diffuse atrophic changes are identified. No findings to suggest acute hemorrhage, acute infarction or space-occupying mass lesion seen. Lacunar infarcts are noted in the basal ganglia on the left stable from the prior exam. No new focal abnormality is seen. Vascular: No hyperdense vessel or unexpected calcification. Skull: Normal. Negative for fracture or focal lesion. Sinuses/Orbits: No acute finding.  Other: None. IMPRESSION: Chronic atrophic and ischemic changes without acute abnormality. Electronically Signed   By: Inez Catalina M.D.   On: 08/31/2017 18:49   Ct Head Wo Contrast  Result Date: 08/23/2017 CLINICAL DATA:  Patient in memory care unit was found on the floor by staff after likely unwitnessed fall. Patient is unable to provide any information minimally communicates hip. Small contusion of the forehead. History of frequent falls. Not on anticoagulation. EXAM: CT HEAD WITHOUT CONTRAST CT CERVICAL SPINE WITHOUT CONTRAST TECHNIQUE: Multidetector CT imaging of the head and cervical spine was performed following the standard protocol without intravenous contrast. Multiplanar CT image reconstructions of the cervical spine were also generated. COMPARISON:  08/23/2017 FINDINGS: CT HEAD FINDINGS Brain: There is mild central and cortical atrophy. Periventricular white matter changes are consistent with small vessel disease. A small lacunar infarct is identified within the LEFT globus pallidus and is chronic. There is no intra or extra-axial fluid collection or mass lesion. The basilar cisterns and ventricles have a normal appearance. There is no CT evidence for acute infarction or hemorrhage. Vascular: No hyperdense vessel or unexpected calcification. Skull: Normal. Negative for fracture or focal lesion. Sinuses/Orbits: No acute finding. Other: LEFT frontal scalp edema not associated with fracture. There is cerumen within  the LEFTexternal auditory canal. CT CERVICAL SPINE FINDINGS Alignment: Normal. Skull base and vertebrae: No acute fracture. No primary bone lesion or focal pathologic process. Soft tissues and spinal canal: No prevertebral fluid or swelling. No visible canal hematoma. Disc levels: Disc height loss at C4-5, C5-6, and C6-7, associated with mild foraminal narrowing at C5-6 in C4-5. Upper chest: There are biapical pleuroparenchymal changes. Other: None IMPRESSION: 1. Atrophy and small vessel  disease. 2. Remote lacunar infarct in the LEFT globus pallidus. 3.  No evidence for acute intracranial abnormality. 4.  No evidence for acute cervical spine abnormality. Electronically Signed   By: Nolon Nations M.D.   On: 08/23/2017 11:21   Ct Cervical Spine Wo Contrast  Result Date: 08/23/2017 CLINICAL DATA:  Patient in memory care unit was found on the floor by staff after likely unwitnessed fall. Patient is unable to provide any information minimally communicates hip. Small contusion of the forehead. History of frequent falls. Not on anticoagulation. EXAM: CT HEAD WITHOUT CONTRAST CT CERVICAL SPINE WITHOUT CONTRAST TECHNIQUE: Multidetector CT imaging of the head and cervical spine was performed following the standard protocol without intravenous contrast. Multiplanar CT image reconstructions of the cervical spine were also generated. COMPARISON:  08/23/2017 FINDINGS: CT HEAD FINDINGS Brain: There is mild central and cortical atrophy. Periventricular white matter changes are consistent with small vessel disease. A small lacunar infarct is identified within the LEFT globus pallidus and is chronic. There is no intra or extra-axial fluid collection or mass lesion. The basilar cisterns and ventricles have a normal appearance. There is no CT evidence for acute infarction or hemorrhage. Vascular: No hyperdense vessel or unexpected calcification. Skull: Normal. Negative for fracture or focal lesion. Sinuses/Orbits: No acute finding. Other: LEFT frontal scalp edema not associated with fracture. There is cerumen within the LEFTexternal auditory canal. CT CERVICAL SPINE FINDINGS Alignment: Normal. Skull base and vertebrae: No acute fracture. No primary bone lesion or focal pathologic process. Soft tissues and spinal canal: No prevertebral fluid or swelling. No visible canal hematoma. Disc levels: Disc height loss at C4-5, C5-6, and C6-7, associated with mild foraminal narrowing at C5-6 in C4-5. Upper chest: There  are biapical pleuroparenchymal changes. Other: None IMPRESSION: 1. Atrophy and small vessel disease. 2. Remote lacunar infarct in the LEFT globus pallidus. 3.  No evidence for acute intracranial abnormality. 4.  No evidence for acute cervical spine abnormality. Electronically Signed   By: Nolon Nations M.D.   On: 08/23/2017 11:21   Mr Brain Wo Contrast  Result Date: 09/06/2017 CLINICAL DATA:  Altered level of consciousness.  Evaluate for CVA EXAM: MRI HEAD WITHOUT CONTRAST TECHNIQUE: Multiplanar, multiecho pulse sequences of the brain and surrounding structures were obtained without intravenous contrast. COMPARISON:  Head CT from 2 days ago.  Brain MRI 04/06/2014 FINDINGS: Brain: No acute infarction, hemorrhage, hydrocephalus, extra-axial collection or mass lesion. Atrophy, advanced in the medial temporal lobes, significantly progressed from 2016. Moderate chronic small vessel ischemic gliosis in the cerebral white matter. Remote small vessel infarct in the left corona radiata and left cerebellum. Vascular: Major flow voids are preserved Skull and upper cervical spine: No evidence of marrow lesion Sinuses/Orbits: Negative IMPRESSION: 1. No acute finding. 2. History of dementia with advanced and progressive medial temporal atrophy suggesting Alzheimer's. 3. Moderate chronic small vessel ischemia. Electronically Signed   By: Monte Fantasia M.D.   On: 09/06/2017 08:04   Dg Chest Port 1 View  Result Date: 09/04/2017 CLINICAL DATA:  Decreased p.o. intake EXAM: PORTABLE CHEST 1  VIEW COMPARISON:  08/31/2017 FINDINGS: The heart size and mediastinal contours are within normal limits. Both lungs are clear. The visualized skeletal structures are unremarkable. IMPRESSION: No active disease. Electronically Signed   By: Kathreen Devoid   On: 09/04/2017 13:31       Subjective: Calm and comfortable.   Discharge Exam: Vitals:   09/06/17 2017 09/07/17 0517  BP: (!) 160/84 (!) 144/85  Pulse: 88 71  Resp: 18 18   Temp: 99 F (37.2 C) 97.7 F (36.5 C)  SpO2: 97% 100%   Vitals:   09/06/17 0526 09/06/17 1446 09/06/17 2017 09/07/17 0517  BP: 127/70 139/78 (!) 160/84 (!) 144/85  Pulse: 80 88 88 71  Resp: 18 12 18 18   Temp: 98.6 F (37 C) 99.2 F (37.3 C) 99 F (37.2 C) 97.7 F (36.5 C)  TempSrc: Oral  Oral Oral  SpO2: 99% 100% 97% 100%  Weight:      Height:        General: Pt is alert, awake, not in acute distress Cardiovascular: RRR, S1/S2 +, no rubs, no gallops Respiratory: CTA bilaterally, no wheezing, no rhonchi Abdominal: Soft, NT, ND, bowel sounds + Extremities: no edema, no cyanosis    The results of significant diagnostics from this hospitalization (including imaging, microbiology, ancillary and laboratory) are listed below for reference.     Microbiology: Recent Results (from the past 240 hour(s))  Urine culture     Status: Abnormal   Collection Time: 09/04/17  4:02 PM  Result Value Ref Range Status   Specimen Description   Final    URINE, CLEAN CATCH Performed at [facility], [place] [address], [place], [place] [address]    Special Requests   Final    NONE Performed at [facility], [place] [address], [place], [place] [address]    Culture (A)  Final    >=100,000 COLONIES/mL PROTEUS MIRABILIS >=100,000 COLONIES/mL ESCHERICHIA COLI    Report Status 09/07/2017 FINAL  Final   Organism ID, Bacteria ESCHERICHIA COLI (A)  Final   Organism ID, Bacteria PROTEUS MIRABILIS (A)  Final      Susceptibility   Escherichia coli - MIC*    AMPICILLIN >=32 RESISTANT Resistant     CEFAZOLIN <=4 SENSITIVE Sensitive     CEFTRIAXONE <=1 SENSITIVE Sensitive     CIPROFLOXACIN 0.5 SENSITIVE Sensitive     GENTAMICIN <=1 SENSITIVE Sensitive     IMIPENEM <=0.25 SENSITIVE Sensitive     NITROFURANTOIN <=16 SENSITIVE Sensitive     TRIMETH/SULFA <=20 SENSITIVE Sensitive     AMPICILLIN/SULBACTAM 16 INTERMEDIATE Intermediate     PIP/TAZO <=4 SENSITIVE  Sensitive     Extended ESBL NEGATIVE Sensitive     * >=100,000 COLONIES/mL ESCHERICHIA COLI   Proteus mirabilis - MIC*    AMPICILLIN <=2 SENSITIVE Sensitive     CEFAZOLIN <=4 SENSITIVE Sensitive     CEFTRIAXONE <=1 SENSITIVE Sensitive     CIPROFLOXACIN <=0.25 SENSITIVE Sensitive     GENTAMICIN <=1 SENSITIVE Sensitive     IMIPENEM 2 SENSITIVE Sensitive     NITROFURANTOIN 128 RESISTANT Resistant     TRIMETH/SULFA <=20 SENSITIVE Sensitive     AMPICILLIN/SULBACTAM <=2 SENSITIVE Sensitive     PIP/TAZO <=4 SENSITIVE Sensitive     * >=100,000 COLONIES/mL PROTEUS MIRABILIS  MRSA PCR Screening     Status: None   Collection Time: 09/05/17 12:35 AM  Result Value Ref Range Status   MRSA by PCR NEGATIVE NEGATIVE Final    Comment:  The GeneXpert MRSA Assay (FDA approved for NASAL specimens only), is one component of a comprehensive MRSA colonization surveillance program. It is not intended to diagnose MRSA infection nor to guide or monitor treatment for MRSA infections. Performed at [facility], [place] [address], [place], [place] [address]      Labs: BNP (last 3 results) No results for input(s): BNP in the last 8760 hours. Basic Metabolic Panel: Recent Labs  Lab 08/31/17 1901 09/04/17 1215 09/05/17 0429 09/07/17 0415  NA 147* 145 149* 143  K 3.8 5.0 4.0 3.9  CL 107 104 111 104  CO2 30 33* 29 30  GLUCOSE 106* 102* 85 120*  BUN 15 28* 22 11  CREATININE 0.88 1.04* 0.83 0.57  CALCIUM 9.2 9.2 9.0 8.7*   Liver Function Tests: Recent Labs  Lab 08/31/17 1901 09/04/17 1215 09/05/17 0429  AST 63* 42* 32  ALT 29 22 22   ALKPHOS 69 76 68  BILITOT 0.9 1.2 0.8  PROT 6.8 7.6 6.9  ALBUMIN 4.0 4.1 3.5   No results for input(s): LIPASE, AMYLASE in the last 168 hours. Recent Labs  Lab 09/04/17 1415  AMMONIA 11   CBC: Recent Labs  Lab 08/31/17 1901 09/04/17 1215 09/05/17 0429  WBC 8.6 10.6* 8.6  NEUTROABS 6.1 7.7  --   HGB 13.4 14.6 13.1  HCT 41.6  46.1* 41.7  40.4  MCV 87.9 89.5 89.3  PLT 590* 666* 555*   Cardiac Enzymes: No results for input(s): CKTOTAL, CKMB, CKMBINDEX, TROPONINI in the last 168 hours. BNP: Invalid input(s): POCBNP CBG: No results for input(s): GLUCAP in the last 168 hours. D-Dimer No results for input(s): DDIMER in the last 72 hours. Hgb A1c No results for input(s): HGBA1C in the last 72 hours. Lipid Profile No results for input(s): CHOL, HDL, LDLCALC, TRIG, CHOLHDL, LDLDIRECT in the last 72 hours. Thyroid function studies Recent Labs    09/05/17 0429  TSH 1.161   Anemia work up Recent Labs    09/05/17 0429  VITAMINB12 1,007*   Urinalysis    Component Value Date/Time   COLORURINE YELLOW 09/04/2017 1602   APPEARANCEUR HAZY (A) 09/04/2017 1602   APPEARANCEUR Turbid (A) 03/24/2014 1011   LABSPEC 1.024 09/04/2017 1602   PHURINE 6.0 09/04/2017 1602   GLUCOSEU NEGATIVE 09/04/2017 1602   HGBUR SMALL (A) 09/04/2017 1602   BILIRUBINUR NEGATIVE 09/04/2017 1602   BILIRUBINUR negative 06/15/2014 1119   BILIRUBINUR Negative 03/24/2014 1011   KETONESUR 20 (A) 09/04/2017 1602   PROTEINUR NEGATIVE 09/04/2017 1602   UROBILINOGEN 1.0 06/15/2014 1119   NITRITE POSITIVE (A) 09/04/2017 1602   LEUKOCYTESUR LARGE (A) 09/04/2017 1602   LEUKOCYTESUR Negative 03/24/2014 1011   Sepsis Labs Invalid input(s): PROCALCITONIN,  WBC,  LACTICIDVEN Microbiology Recent Results (from the past 240 hour(s))  Urine culture     Status: Abnormal   Collection Time: 09/04/17  4:02 PM  Result Value Ref Range Status   Specimen Description   Final    URINE, CLEAN CATCH Performed at [facility] [place] [address], [place], [place] [address]    Special Requests   Final    NONE Performed at [facility], [place] [address] [place],  [address]    Culture (A)  Final    >=100,000 COLONIES/mL PROTEUS MIRABILIS >=100,000 COLONIES/mL ESCHERICHIA COLI    Report Status 09/07/2017 FINAL   Final   Organism ID, Bacteria ESCHERICHIA COLI (A)  Final   Organism ID, Bacteria PROTEUS MIRABILIS (A)  Final  Susceptibility   Escherichia coli - MIC*    AMPICILLIN >=32 RESISTANT Resistant     CEFAZOLIN <=4 SENSITIVE Sensitive     CEFTRIAXONE <=1 SENSITIVE Sensitive     CIPROFLOXACIN 0.5 SENSITIVE Sensitive     GENTAMICIN <=1 SENSITIVE Sensitive     IMIPENEM <=0.25 SENSITIVE Sensitive     NITROFURANTOIN <=16 SENSITIVE Sensitive     TRIMETH/SULFA <=20 SENSITIVE Sensitive     AMPICILLIN/SULBACTAM 16 INTERMEDIATE Intermediate     PIP/TAZO <=4 SENSITIVE Sensitive     Extended ESBL NEGATIVE Sensitive     * >=100,000 COLONIES/mL ESCHERICHIA COLI   Proteus mirabilis - MIC*    AMPICILLIN <=2 SENSITIVE Sensitive     CEFAZOLIN <=4 SENSITIVE Sensitive     CEFTRIAXONE <=1 SENSITIVE Sensitive     CIPROFLOXACIN <=0.25 SENSITIVE Sensitive     GENTAMICIN <=1 SENSITIVE Sensitive     IMIPENEM 2 SENSITIVE Sensitive     NITROFURANTOIN 128 RESISTANT Resistant     TRIMETH/SULFA <=20 SENSITIVE Sensitive     AMPICILLIN/SULBACTAM <=2 SENSITIVE Sensitive     PIP/TAZO <=4 SENSITIVE Sensitive     * >=100,000 COLONIES/mL PROTEUS MIRABILIS  MRSA PCR Screening     Status: None   Collection Time: 09/05/17 12:35 AM  Result Value Ref Range Status   MRSA by PCR NEGATIVE NEGATIVE Final    Comment:        The GeneXpert MRSA Assay (FDA approved for NASAL specimens only), is one component of a comprehensive MRSA colonization surveillance program. It is not intended to diagnose MRSA infection nor to guide or monitor treatment for MRSA infections. Performed at [facility], [place] [address], [place], [place] [address]      Time coordinating discharge: 34 minutes  SIGNED:   Hosie Poisson, MD  [facility] 09/07/2017, 11:45 AM Pager   If 7PM-7AM, please contact night-coverage [email or web address] Password [email or web address]

## 2017-09-16 ENCOUNTER — Encounter: Payer: Self-pay | Admitting: Internal Medicine

## 2017-10-04 ENCOUNTER — Encounter: Payer: Medicare Other | Admitting: Internal Medicine

## 2017-11-19 ENCOUNTER — Non-Acute Institutional Stay: Payer: Medicare Other | Admitting: Internal Medicine

## 2017-11-19 DIAGNOSIS — Z515 Encounter for palliative care: Secondary | ICD-10-CM

## 2017-11-19 DIAGNOSIS — R413 Other amnesia: Secondary | ICD-10-CM

## 2017-11-19 NOTE — Progress Notes (Signed)
    PALLIATIVE CARE CONSULT VISIT   PATIENT NAME: Deanna [name] DOB: [birth year] MRN: [id number]  PRIMARY CARE PROVIDER:  Lesia Hausen, PA REFERRING PROVIDER: Lesia Hausen, PA RESPONSIBLE PARTY:   Deanna [name](son) [phone]   RECOMMENDATIONS and PLAN:  1.  Memory Loss  R41.3:  At baseline in memory care unit.  FAST 7b.  Continues to eat.  Minimal communication. Continue to provide supportive care.  2.  Palliative Care Encounter Z51.5:  Son not present at facility and he was called to discuss ACP.  Unable to complete call due to prior commitments of son.  Plan on additional conversations in the very near future. Palliative care will contine to follow.  .  I spent 15 minutes providing this consultation,  from 1:30pm to 1:45pm at [facility]. More than 50% of the time in this consultation was spent coordinating communication with facility staff.   HISTORY OF PRESENT ILLNESS:  Followup with  Deanna [name] finds that she is at her baseline health and level of dementia.  Staff report that she has been without behaviors, falls and continues to feed herself.  She does require assistance with meals occasionally.  No recent hospitalizations.   CODE STATUS: FULL CODE  PPS: 40% HOSPICE ELIGIBILITY/DIAGNOSIS: TBD  PAST MEDICAL HISTORY:  Past Medical History:  Diagnosis Date  . Dementia Dx 2015  . Hyperlipidemia DX 2015  . Hypertension Dx 2015    SOCIAL HX:  Social History   Tobacco Use  . Smoking status: Never Smoker  . Smokeless tobacco: Never Used  Substance Use Topics  . Alcohol use: No    ALLERGIES:  Allergies  Allergen Reactions  . Pork-Derived Products      PERTINENT MEDICATIONS:  Outpatient Encounter Medications as of 11/19/2017  Medication Sig  . buPROPion (WELLBUTRIN XL) 150 MG 24 hr tablet Take 1 tablet (150 mg total) by mouth daily.  . cephALEXin (KEFLEX) 500 MG capsule Take 1 capsule (500 mg total) by mouth 2 (two) times daily.  . feeding supplement, ENSURE ENLIVE,  (ENSURE ENLIVE) LIQD Take 237 mLs by mouth 3 (three) times daily between meals.  . lactase (LACTAID) 3000 units tablet Take 9,000 Units by mouth 3 (three) times daily before meals.  . mirtazapine (REMERON) 15 MG tablet Take 15 mg by mouth at bedtime.  . Multiple Vitamin (THERA) TABS Take 1 tablet by mouth daily.   Marland Kitchen OLANZapine (ZYPREXA) 5 MG tablet Take 1 tablet (5 mg total) by mouth at bedtime.  . polyethylene glycol (MIRALAX / GLYCOLAX) packet Take 17 g by mouth daily as needed (constipation).  Marland Kitchen senna-docusate (SENOKOT-S) 8.6-50 MG tablet Take 1 tablet by mouth 2 (two) times daily.  . sertraline (ZOLOFT) 100 MG tablet Take 100 mg by mouth daily.  . simvastatin (ZOCOR) 40 MG tablet Take 40 mg by mouth 2 (two) times daily.   No facility-administered encounter medications on file as of 11/19/2017.     PHYSICAL EXAM:   General: Chronically ill and  frail appearing, thin.  In NAD at DR table Cardiovascular: regular rate and rhythm Pulmonary: clear throughout  Abdomen: soft, nontender, + bowel sounds Extremities: no edema or ecchymosis Skin: Exposed skin is intact Neurological: Alert with speech of 1-2 unintelligible words.  She does not follow commands.    Gonzella Lex, NP-C

## 2017-12-02 ENCOUNTER — Emergency Department (HOSPITAL_COMMUNITY): Payer: Medicare Other

## 2017-12-02 ENCOUNTER — Other Ambulatory Visit: Payer: Self-pay

## 2017-12-02 ENCOUNTER — Encounter (HOSPITAL_COMMUNITY): Payer: Self-pay

## 2017-12-02 ENCOUNTER — Inpatient Hospital Stay (HOSPITAL_COMMUNITY)
Admission: EM | Admit: 2017-12-02 | Discharge: 2017-12-03 | DRG: 948 | Disposition: A | Discharge status: Nursing Home (Includes ICF) | Payer: Medicare Other | Source: Skilled Nursing Facility | Attending: Oncology | Admitting: Oncology

## 2017-12-02 DIAGNOSIS — I119 Hypertensive heart disease without heart failure: Secondary | ICD-10-CM | POA: Diagnosis present

## 2017-12-02 DIAGNOSIS — Z79899 Other long term (current) drug therapy: Secondary | ICD-10-CM

## 2017-12-02 DIAGNOSIS — R262 Difficulty in walking, not elsewhere classified: Secondary | ICD-10-CM | POA: Diagnosis not present

## 2017-12-02 DIAGNOSIS — R531 Weakness: Principal | ICD-10-CM | POA: Diagnosis present

## 2017-12-02 DIAGNOSIS — Z87440 Personal history of urinary (tract) infections: Secondary | ICD-10-CM | POA: Diagnosis not present

## 2017-12-02 DIAGNOSIS — R4182 Altered mental status, unspecified: Secondary | ICD-10-CM | POA: Diagnosis present

## 2017-12-02 DIAGNOSIS — Z66 Do not resuscitate: Secondary | ICD-10-CM | POA: Diagnosis not present

## 2017-12-02 DIAGNOSIS — Z818 Family history of other mental and behavioral disorders: Secondary | ICD-10-CM

## 2017-12-02 DIAGNOSIS — Z91018 Allergy to other foods: Secondary | ICD-10-CM | POA: Diagnosis not present

## 2017-12-02 DIAGNOSIS — J189 Pneumonia, unspecified organism: Secondary | ICD-10-CM

## 2017-12-02 DIAGNOSIS — F0390 Unspecified dementia without behavioral disturbance: Secondary | ICD-10-CM | POA: Diagnosis present

## 2017-12-02 DIAGNOSIS — E785 Hyperlipidemia, unspecified: Secondary | ICD-10-CM | POA: Diagnosis not present

## 2017-12-02 DIAGNOSIS — J181 Lobar pneumonia, unspecified organism: Secondary | ICD-10-CM

## 2017-12-02 LAB — CBC WITH DIFFERENTIAL/PLATELET
Abs Immature Granulocytes: 0 10*3/uL (ref 0.0–0.1)
Basophils Absolute: 0 10*3/uL (ref 0.0–0.1)
Basophils Relative: 1 %
Eosinophils Absolute: 0.1 10*3/uL (ref 0.0–0.7)
Eosinophils Relative: 2 %
HEMATOCRIT: 44 % (ref 36.0–46.0)
HEMOGLOBIN: 13.4 g/dL (ref 12.0–15.0)
Immature Granulocytes: 0 %
LYMPHS ABS: 1.6 10*3/uL (ref 0.7–4.0)
Lymphocytes Relative: 20 %
MCH: 27.9 pg (ref 26.0–34.0)
MCHC: 30.5 g/dL (ref 30.0–36.0)
MCV: 91.5 fL (ref 78.0–100.0)
Monocytes Absolute: 0.7 10*3/uL (ref 0.1–1.0)
Monocytes Relative: 8 %
Neutro Abs: 5.5 10*3/uL (ref 1.7–7.7)
Neutrophils Relative %: 69 %
Platelets: 608 10*3/uL — ABNORMAL HIGH (ref 150–400)
RBC: 4.81 MIL/uL (ref 3.87–5.11)
RDW: 14.6 % (ref 11.5–15.5)
WBC: 7.9 10*3/uL (ref 4.0–10.5)

## 2017-12-02 LAB — URINALYSIS, ROUTINE W REFLEX MICROSCOPIC
Bilirubin Urine: NEGATIVE
Glucose, UA: NEGATIVE mg/dL
HGB URINE DIPSTICK: NEGATIVE
KETONES UR: NEGATIVE mg/dL
LEUKOCYTES UA: NEGATIVE
Nitrite: NEGATIVE
PROTEIN: NEGATIVE mg/dL
Specific Gravity, Urine: 1.03 (ref 1.005–1.030)
pH: 5 (ref 5.0–8.0)

## 2017-12-02 LAB — RAPID URINE DRUG SCREEN, HOSP PERFORMED
Amphetamines: NOT DETECTED
Barbiturates: NOT DETECTED
Benzodiazepines: NOT DETECTED
Cocaine: NOT DETECTED
Opiates: NOT DETECTED
Tetrahydrocannabinol: NOT DETECTED

## 2017-12-02 LAB — RESPIRATORY PANEL BY PCR
ADENOVIRUS-RVPPCR: NOT DETECTED
BORDETELLA PERTUSSIS-RVPCR: NOT DETECTED
CHLAMYDOPHILA PNEUMONIAE-RVPPCR: NOT DETECTED
Coronavirus 229E: NOT DETECTED
Coronavirus HKU1: NOT DETECTED
Coronavirus NL63: NOT DETECTED
Coronavirus OC43: NOT DETECTED
INFLUENZA B-RVPPCR: NOT DETECTED
Influenza A: NOT DETECTED
MYCOPLASMA PNEUMONIAE-RVPPCR: NOT DETECTED
Metapneumovirus: NOT DETECTED
PARAINFLUENZA VIRUS 3-RVPPCR: NOT DETECTED
PARAINFLUENZA VIRUS 4-RVPPCR: NOT DETECTED
Parainfluenza Virus 1: NOT DETECTED
Parainfluenza Virus 2: NOT DETECTED
RESPIRATORY SYNCYTIAL VIRUS-RVPPCR: NOT DETECTED
RHINOVIRUS / ENTEROVIRUS - RVPPCR: NOT DETECTED

## 2017-12-02 LAB — MRSA PCR SCREENING: MRSA by PCR: NEGATIVE

## 2017-12-02 LAB — COMPREHENSIVE METABOLIC PANEL
ALBUMIN: 3.8 g/dL (ref 3.5–5.0)
ALK PHOS: 64 U/L (ref 38–126)
ALT: 16 U/L (ref 0–44)
AST: 24 U/L (ref 15–41)
Anion gap: 12 (ref 5–15)
BILIRUBIN TOTAL: 0.6 mg/dL (ref 0.3–1.2)
BUN: 16 mg/dL (ref 8–23)
CALCIUM: 9 mg/dL (ref 8.9–10.3)
CO2: 30 mmol/L (ref 22–32)
Chloride: 102 mmol/L (ref 98–111)
Creatinine, Ser: 0.88 mg/dL (ref 0.44–1.00)
GFR calc Af Amer: >60 mL/min (ref >60)
Glucose, Bld: 97 mg/dL (ref 70–99)
Potassium: 3.8 mmol/L (ref 3.5–5.1)
Sodium: 144 mmol/L (ref 135–145)
TOTAL PROTEIN: 6.6 g/dL (ref 6.5–8.1)

## 2017-12-02 LAB — TROPONIN I: Troponin I: <0.03 ng/mL (ref <0.03)

## 2017-12-02 LAB — CBG MONITORING, ED: Glucose-Capillary: 90 mg/dL (ref 70–99)

## 2017-12-02 MED ORDER — BUPROPION HCL ER (XL) 150 MG PO TB24
150.0000 mg | ORAL_TABLET | Freq: Every day | ORAL | Status: DC
  Administered 2017-12-03: 150 mg via ORAL
  Filled 2017-12-02: qty 1

## 2017-12-02 MED ORDER — MIRTAZAPINE 15 MG PO TABS
15.0000 mg | ORAL_TABLET | Freq: Every day | ORAL | Status: DC
  Administered 2017-12-03: 15 mg via ORAL
  Filled 2017-12-02: qty 1

## 2017-12-02 MED ORDER — OLANZAPINE 5 MG PO TABS
5.0000 mg | ORAL_TABLET | Freq: Every day | ORAL | Status: DC
  Administered 2017-12-03: 5 mg via ORAL
  Filled 2017-12-02: qty 1

## 2017-12-02 MED ORDER — SENNOSIDES-DOCUSATE SODIUM 8.6-50 MG PO TABS
1.0000 | ORAL_TABLET | Freq: Every evening | ORAL | Status: DC | PRN
  Administered 2017-12-03: 1 via ORAL
  Filled 2017-12-02: qty 1

## 2017-12-02 MED ORDER — ACETAMINOPHEN 650 MG RE SUPP: 650.0000 mg | Freq: Four times a day (QID) | RECTAL | Status: DC | PRN

## 2017-12-02 MED ORDER — SODIUM CHLORIDE 0.9 % IV SOLN
INTRAVENOUS | Status: DC | PRN
  Administered 2017-12-02: 250 mL via INTRAVENOUS

## 2017-12-02 MED ORDER — SODIUM CHLORIDE 0.9 % IV SOLN
1.0000 g | Freq: Once | INTRAVENOUS | Status: AC
  Administered 2017-12-02: 1 g via INTRAVENOUS
  Filled 2017-12-02: qty 10

## 2017-12-02 MED ORDER — SERTRALINE HCL 100 MG PO TABS
100.0000 mg | ORAL_TABLET | Freq: Every day | ORAL | Status: DC
  Administered 2017-12-03: 100 mg via ORAL
  Filled 2017-12-02: qty 1

## 2017-12-02 MED ORDER — SODIUM CHLORIDE 0.9 % IV SOLN
500.0000 mg | Freq: Once | INTRAVENOUS | Status: AC
  Administered 2017-12-02: 500 mg via INTRAVENOUS
  Filled 2017-12-02: qty 500

## 2017-12-02 MED ORDER — ACETAMINOPHEN 325 MG PO TABS: 650.0000 mg | ORAL_TABLET | Freq: Four times a day (QID) | ORAL | Status: DC | PRN

## 2017-12-02 MED ORDER — SODIUM CHLORIDE 0.9 % IV SOLN
INTRAVENOUS | Status: AC
  Administered 2017-12-02: 18:00:00 via INTRAVENOUS

## 2017-12-02 NOTE — H&P (Signed)
Date: 12/02/2017               Patient Name:  Deanna [name] MRN: [id number]  DOB: [birth year] Age / Sex: 72 y.o., female   PCP: System, Pcp Not In         Medical Service: Internal Medicine Teaching Service         Attending Physician: Dr. Beryle Beams, Alyson Locket, MD    First Contact: Dr. Annie Paras Pager: [phone]  Second Contact: Dr. Heber [place] Pager: [phone]       After Hours (After 5p/  First Contact Pager: [phone]  weekends / holidays): Second Contact Pager: [phone]   Chief Complaint: AMS  History of Present Illness: Deanna [name] a 72 y.o.femalewith medical history significant ofadvanced dementia, HTN, and HLD who presents from [place] for altered mental status. Her son was present at bedside and able to provide history. He reports that at her baseline, she is able to walk independently, feed herself, and respond to questions, however it is very difficult to understand her as she speaks softly and stammers. She is confused at baseline and does not always know who her son is or who she is. He was told by ALF staff that over the weekend his mother seemed weak on the left side and was not ambulating independently. She also appeared to be holding her mouth open and drooling. Staff was waiting for her to return to baseline, but she continued to be altered, so she was transported to the ED. The son reports that his mother seems "slightly below" her baseline currently as she is not walking by herself or interacting as much as normal. She takes multiple centrally-acting medications including buproprion, remeron, zyprexa, and sertraline. Her only recent medication change was discontinuing donepezil. The patient states she is not in pain. No recent falls noted by staff or son.  Of note, she was recently hospitalized 09/2017 for acute encephalopathy and UTI with e.coli and proteus. Her mental status at that time was much worse than currently according to the son. At that time she was  unable to walk or respond. She was treated with Keflex. Encephalopathy workup with brain MRI, ammonia, B12, and TSH was normal. Upon discharge, she was back to her baseline.  Upon arrival to the ED, she was afebrile, hemodynamically stable, and satting well on room air. CBC, CMP, UDS, and UA wnl. Trop negative. EKG showed NSR with right atrial enlargement, no acute ischemic changes, and no changes from prior. CT head normal. CXR showed patchy opacities in the right upper hemithorax representing infection vs underlying nodule. She received 1/2L NS, IV ceftriaxone, and IV azithromycin for possible CAP.  Meds:  Current Meds  Medication Sig  . buPROPion (WELLBUTRIN XL) 150 MG 24 hr tablet Take 1 tablet (150 mg total) by mouth daily.  Marland Kitchen lactase (LACTAID) 3000 units tablet Take 9,000 Units by mouth 3 (three) times daily before meals.  . mirtazapine (REMERON) 15 MG tablet Take 15 mg by mouth at bedtime.  . Multiple Vitamin (THERA) TABS Take 1 tablet by mouth daily.   Marland Kitchen OLANZapine (ZYPREXA) 5 MG tablet Take 1 tablet (5 mg total) by mouth at bedtime.  . polyethylene glycol (MIRALAX / GLYCOLAX) packet Take 17 g by mouth daily as needed (constipation).  Marland Kitchen senna-docusate (SENOKOT-S) 8.6-50 MG tablet Take 1 tablet by mouth 2 (two) times daily.  . sertraline (ZOLOFT) 100 MG tablet Take 100 mg by mouth daily.  . simvastatin (ZOCOR) 40 MG tablet Take  40 mg by mouth 2 (two) times daily.   Allergies: Allergies as of 12/02/2017 - Review Complete 12/02/2017  Allergen Reaction Noted  . Pork-derived products  09/06/2017   Past Medical History:  Diagnosis Date  . Dementia Dx 2015  . Hyperlipidemia DX 2015  . Hypertension Dx 2015   Past Surgical History:  Procedure Laterality Date  . [facility]     Family History: mother had dementia  Social History: Retired Marine scientist. Used to live with her son, however she became too altered to care for at home and was moved to [facility], where she has  been for about a year. Never smoker. Son denies history of alcohol or illicit drug use.   Review of Systems: A complete ROS was negative except as per HPI.   Physical Exam: Blood pressure 118/69, pulse 77, temperature 98.3 F (36.8 C), temperature source Oral, resp. rate 14, height 5\' 6"  (1.676 m), weight 54.4 kg, SpO2 100 %.  Constitutional: Thin, lying comfortably in bed, no distress.  Neuro: Alert, but not oriented. Responds to questions, but speech is unintelligible. Does not follow commands, but has good strength in upper extremities when she resists the examiner's attempt to help her sit up. PEERL. EOMI as she follows the examiner. With help, she was able to stand up and take a small step forward. No facial droop. Strength equal bilaterally. HEENT: Normocephalic, atraumatic. Cardiovascular: Normal rate and regular rhythm. No murmurs, rubs, or gallops. Pulmonary/Chest: Effort normal. Clear to auscultation bilaterally. No wheezes, rales, or rhonchi. Abdominal: Bowel sounds present. Soft, non-distended, non-tender. Ext: No lower extremity edema. Skin: Warm and dry. No rashes or wounds.  EKG: personally reviewed my interpretation is NSR with right atrial enlargement, no acute ischemic changes, and no changes from prior  CXR: personally reviewed my interpretation is patchy opacities in the right upper hemithorax representing infection vs underlying nodule  Assessment & Plan by Problem: Active Problems:   Generalized weakness  Deanna [name] is a 72 yo female with a medical history of advanced dementia, HTN, and HLD who presented from her ALF for observed weakness with walking and drooling that started two days prior to arrival. Patient is currently afebrile and hemodynamically stable.  Generalized Weakness - Patient's mentation appears similar to baseline based on son's report. However, she is usually able to ambulate independently.  - Her vitals and lab work are wnl. Head CT neg. CXR  shows possible right upper opacity suspicious for pneumonia, but the patient is afebrile, without leukocytosis, and without cough. She has had AMS from UTI in the past, but her UA is negative for infection. CT negative for masses or bleeds. DDx also includes CVA given weakness, medication side effect, or progressing dementia. July 2019 workup for encephalopathy with negative brain MRI, ammonia, B12, and TSH. Plan - Cardiac monitoring - NS 75ml/hr for 10 hours - am CBC and BMP - Continue home buproprion 150mg  daily, mirtazepine 15 mg daily, olanzapine 5mg  qhs, and sertraline 100mg  daily  HLD - continue home simvastatin 40mg    FEN: NS 67ml/hr, regular diet, replace electrolytes as needed  DVT ppx: SCDs (allergy to pork products) Code status:  DNR  Dispo: Admit patient to Inpatient with expected length of stay greater than 2 midnights.  Signed: Corinne Ports, MD 12/02/2017, 5:18 PM  Pager: [phone]

## 2017-12-02 NOTE — ED Notes (Signed)
Patient transported to CT 

## 2017-12-02 NOTE — ED Notes (Signed)
Admitting at bedside 

## 2017-12-02 NOTE — Progress Notes (Signed)
New Admission Note:   Arrival Method: Bed Mental Orientation: A&OX0 Telemetry: Initiated Assessment: Completed Skin: WDL IV: WDL Pain: Faces-0 Tubes: Purewick placed.  Admission: Pt. Not oriented to complete.  Unit Orientation: Patient introduced to call bell. Not able to comprehend.   Orders have been reviewed and implemented. Will continue to monitor the patient. Call light has been placed within reach and bed alarm has been activated.    Aneta Mins BSN, RN

## 2017-12-02 NOTE — Discharge Instructions (Addendum)
° °  Followup PA and lateral chest X-ray  is recommended in 3-4 weeks following trial of antibiotic therapy to  ensure resolution and exclude underlying malignancy.

## 2017-12-02 NOTE — ED Provider Notes (Signed)
[facility] EMERGENCY DEPARTMENT Provider Note   CSN: [id number] Arrival date & time: 12/02/17  1150     History   Chief Complaint Chief Complaint  Patient presents with  . Altered Mental Status    HPI Lus Kriegel is a 72 y.o. female.  HPI   Level 5 caveat due to baseline dementia.  Danalee Flath is a 72 y.o. female, with a history of dementia, hyperlipidemia, and HTN, presenting to the ED from SNF with reported altered mental status.  Last seen normal 9/27.  Patient reportedly talks with staff at baseline, but today is not speaking with them and they report question of left-sided weakness.  Patient makes eye contact and will respond to questions, but answers are unintelligible.     Past Medical History:  Diagnosis Date  . Dementia Dx 2015  . Hyperlipidemia DX 2015  . Hypertension Dx 2015    Patient Active Problem List   Diagnosis Date Noted  . Dehydration   . Advance care planning   . Goals of care, counseling/discussion   . Malnutrition of moderate degree 09/05/2017  . Altered mental status, unspecified 09/04/2017  . Dementia with behavioral disturbance 08/04/2015  . Increased ammonia level 06/15/2014  . Left flank pain 06/15/2014  . Cognitive changes 03/24/2014  . Fatigue 03/24/2014  . Vitreous floaters of right eye 03/11/2014  . Underweight 03/11/2014  . HTN (hypertension) 12/16/2013  . HLD (hyperlipidemia) 12/16/2013  . Dementia 12/16/2013    Past Surgical History:  Procedure Laterality Date  . [facility]      OB History   None      Home Medications    Prior to Admission medications   Medication Sig Start Date End Date Taking? Authorizing Provider  buPROPion (WELLBUTRIN XL) 150 MG 24 hr tablet Take 1 tablet (150 mg total) by mouth daily. 10/19/15 12/02/17 Yes Noel, Tiffany S, PA-C  lactase (LACTAID) 3000 units tablet Take 9,000 Units by mouth 3 (three) times daily before meals.   Yes [provider]  mirtazapine (REMERON) 15 MG tablet Take 15 mg by mouth at bedtime.   Yes [provider]  Multiple Vitamin (THERA) TABS Take 1 tablet by mouth daily.    Yes [provider]  OLANZapine (ZYPREXA) 5 MG tablet Take 1 tablet (5 mg total) by mouth at bedtime. 11/03/15  Yes Funches, Josalyn, MD  polyethylene glycol (MIRALAX / GLYCOLAX) packet Take 17 g by mouth daily as needed (constipation).   Yes [provider]  senna-docusate (SENOKOT-S) 8.6-50 MG tablet Take 1 tablet by mouth 2 (two) times daily.   Yes [provider]  sertraline (ZOLOFT) 100 MG tablet Take 100 mg by mouth daily.   Yes [provider]  simvastatin (ZOCOR) 40 MG tablet Take 40 mg by mouth 2 (two) times daily.   Yes [provider]  cephALEXin (KEFLEX) 500 MG capsule Take 1 capsule (500 mg total) by mouth 2 (two) times daily. Patient not taking: Reported on 12/02/2017 09/07/17   Hosie Poisson, MD  feeding supplement, ENSURE ENLIVE, (ENSURE ENLIVE) LIQD Take 237 mLs by mouth 3 (three) times daily between meals. 09/07/17   Hosie Poisson, MD    Family History Family History  Problem Relation Age of Onset  . Cancer Neg Hx   . Heart disease Neg Hx   . Dementia Neg Hx     Social History Social History   Tobacco Use  . Smoking status: Never Smoker  . Smokeless  tobacco: Never Used  Substance Use Topics  . Alcohol use: No  . Drug use: No     Allergies   Pork-derived products   Review of Systems Review of Systems  Unable to perform ROS: Dementia     Physical Exam Updated Vital Signs BP 121/65   Pulse 72   Temp 98.3 F (36.8 C) (Oral)   Resp 14   Ht 5\' 6"  (1.676 m)   Wt 54.4 kg   SpO2 98%   BMI 19.37 kg/m   Physical Exam  Constitutional: She appears well-developed and well-nourished. No distress.  HENT:  Head: Normocephalic and atraumatic.  Eyes: Conjunctivae are normal.  Neck: Neck supple.  Cardiovascular: Normal rate, regular rhythm, normal heart  sounds and intact distal pulses.  Pulmonary/Chest: Effort normal and breath sounds normal. No respiratory distress.  Abdominal: Soft. There is no tenderness. There is no guarding.  Musculoskeletal: She exhibits no edema.  Lymphadenopathy:    She has no cervical adenopathy.  Neurological: She is alert.  Patient appears to be able to move each of her extremities. She will not follow commands.  She seems to be able to follow me around the room with her eyes. No noted facial droop. When her hands are pulled away from her body, she resists with equal strength.  Similarly, when her legs are raised she tries to resist with equal strength.   Skin: Skin is warm and dry. She is not diaphoretic.  Psychiatric: She has a normal mood and affect. Her behavior is normal.  Nursing note and vitals reviewed.    ED Treatments / Results  Labs (all labs ordered are listed, but only abnormal results are displayed) Labs Reviewed  CBC WITH DIFFERENTIAL/PLATELET - Abnormal; Notable for the following components:      Result Value   Platelets 608 (*)    All other components within normal limits  URINALYSIS, ROUTINE W REFLEX MICROSCOPIC - Abnormal; Notable for the following components:   Color, Urine AMBER (*)    APPearance HAZY (*)    All other components within normal limits  COMPREHENSIVE METABOLIC PANEL  TROPONIN I  RAPID URINE DRUG SCREEN, HOSP PERFORMED  CBG MONITORING, ED    EKG EKG Interpretation  Date/Time:  Monday December 02 2017 11:52:09 EDT Ventricular Rate:  79 PR Interval:    QRS Duration: 94 QT Interval:  405 QTC Calculation: 465 R Axis:   77 Text Interpretation:  Sinus rhythm Right atrial enlargement Consider left ventricular hypertrophy No significant change since last tracing Confirmed by Lajean Saver [phone]) on 12/02/2017 12:34:37 PM   Radiology Ct Head Wo Contrast  Result Date: 12/02/2017 CLINICAL DATA:  Altered level of consciousness.  Dementia EXAM: CT HEAD WITHOUT  CONTRAST TECHNIQUE: Contiguous axial images were obtained from the base of the skull through the vertex without intravenous contrast. COMPARISON:  09/04/2017 FINDINGS: Brain: No evidence of acute infarction, hemorrhage, hydrocephalus, extra-axial collection or mass lesion/mass effect. Atrophy most notable in the medial temporal lobes, suggesting Alzheimer's disease in this patient with history of dementia. Chronic small vessel ischemia including remote lacunar infarct in the left corona radiata. Vascular: Atherosclerotic calcification Skull: Negative Sinuses/Orbits: Negative Other: Limited study due to degree of streak artifact and head tilt. IMPRESSION: No acute finding.  Stable from 09/04/2017. Electronically Signed   By: Monte Fantasia M.D.   On: 12/02/2017 13:21   Dg Chest Portable 1 View  Result Date: 12/02/2017 CLINICAL DATA:  Altered mental status EXAM: PORTABLE CHEST 1 VIEW COMPARISON:  Chest  radiograph 09/04/2017 FINDINGS: Monitoring leads overlie the patient. Stable cardiac and mediastinal contours. Aortic atherosclerosis. Patchy opacities right upper hemithorax. No pleural effusion or pneumothorax. IMPRESSION: Patchy opacities right upper hemithorax may represent infection. Underlying nodule not excluded. Followup PA and lateral chest X-ray is recommended in 3-4 weeks following trial of antibiotic therapy to ensure resolution and exclude underlying malignancy. Electronically Signed   By: Lovey Newcomer M.D.   On: 12/02/2017 12:55    Procedures Procedures (including critical care time)  Medications Ordered in ED Medications  cefTRIAXone (ROCEPHIN) 1 g in sodium chloride 0.9 % 100 mL IVPB (has no administration in time range)  azithromycin (ZITHROMAX) 500 mg in sodium chloride 0.9 % 250 mL IVPB (has no administration in time range)  0.9 %  sodium chloride infusion (has no administration in time range)     Initial Impression / Assessment and Plan / ED Course  I have reviewed the triage vital  signs and the nursing notes.  Pertinent labs & imaging results that were available during my care of the patient were reviewed by me and considered in my medical decision making (see chart for details).  Clinical Course as of Dec 03 1522  Mon Dec 02, 2017  1444 Attempted to call [facility]. Receptionist answered, said she was transferring call, eventually went to VM for business Freight forwarder.   [SJ]  [place] with [place], Med Tech at [facility]. States patient usually walks on her own and will answer some questions, but is confused due to her dementia. Last known normal 7:30AM.  Sometime after breakfast she states the staff noticed she was no longer able to walk and was drooling.   [SJ]  [place] with [facility] [employer], patient's son. States patient is typically weak and needs assistance, but can walk and is interactive. Mumbled speech.  States my description of the patient's current presentation is consistent with how she has presented in the past with infections.  Agrees with plan for admission.    [SJ]  [place] with Janett Billow, IM resident. Agrees to evaluate patient for admission.   [SJ]    Clinical Course User Index [SJ] Joy, Shawn C, PA-C    Patient presents with reported overall weakness and decrease in mental status.  No noted focal neuro deficits on this examination of patient with dementia. She does appear to have an opacity in the right upper lobe of the lung.  She will be admitted and treated for CAP. Doubt sepsis.    Findings and plan of care discussed with Lajean Saver, MD. Dr. Ashok Cordia personally evaluated and examined this patient.   Vitals:   12/02/17 1400 12/02/17 1415 12/02/17 1445 12/02/17 1515  BP: 123/64 121/65 (!) 114/54 135/69  Pulse:   67 69  Resp:   16 14  Temp:      TempSrc:      SpO2:   100% 100%  Weight:      Height:         Final Clinical Impressions(s) / ED Diagnoses   Final diagnoses:  Community acquired pneumonia of right upper lobe  of lung [facility])    ED Discharge Orders    None       Layla Maw 12/02/17 1525    Lajean Saver, MD 12/03/17 0830

## 2017-12-02 NOTE — ED Notes (Signed)
Returned from CT scan.

## 2017-12-02 NOTE — ED Triage Notes (Signed)
Pt arrived via [facility] from [facility] where staff reports AMS with LSN Friday. Pt has dementia and baseline mental status will talk with staff, pt not speaking with staff today and staff reports weak left side. Pt. Not following commands with EMS but has strong grip strengths when grabbing at cords and clothing.

## 2017-12-03 DIAGNOSIS — Z87440 Personal history of urinary (tract) infections: Secondary | ICD-10-CM | POA: Diagnosis not present

## 2017-12-03 DIAGNOSIS — R4182 Altered mental status, unspecified: Secondary | ICD-10-CM | POA: Diagnosis present

## 2017-12-03 DIAGNOSIS — Z66 Do not resuscitate: Secondary | ICD-10-CM | POA: Diagnosis not present

## 2017-12-03 DIAGNOSIS — Z91018 Allergy to other foods: Secondary | ICD-10-CM | POA: Diagnosis not present

## 2017-12-03 DIAGNOSIS — R531 Weakness: Secondary | ICD-10-CM | POA: Diagnosis not present

## 2017-12-03 DIAGNOSIS — Z818 Family history of other mental and behavioral disorders: Secondary | ICD-10-CM | POA: Diagnosis not present

## 2017-12-03 DIAGNOSIS — F0390 Unspecified dementia without behavioral disturbance: Secondary | ICD-10-CM | POA: Diagnosis not present

## 2017-12-03 DIAGNOSIS — Z79899 Other long term (current) drug therapy: Secondary | ICD-10-CM | POA: Diagnosis not present

## 2017-12-03 DIAGNOSIS — R262 Difficulty in walking, not elsewhere classified: Secondary | ICD-10-CM | POA: Diagnosis not present

## 2017-12-03 DIAGNOSIS — E785 Hyperlipidemia, unspecified: Secondary | ICD-10-CM | POA: Diagnosis not present

## 2017-12-03 DIAGNOSIS — I119 Hypertensive heart disease without heart failure: Secondary | ICD-10-CM | POA: Diagnosis not present

## 2017-12-03 LAB — CBC
HCT: 45.6 % (ref 36.0–46.0)
HEMOGLOBIN: 14 g/dL (ref 12.0–15.0)
MCH: 27.5 pg (ref 26.0–34.0)
MCHC: 30.7 g/dL (ref 30.0–36.0)
MCV: 89.4 fL (ref 78.0–100.0)
PLATELETS: 590 10*3/uL — AB (ref 150–400)
RBC: 5.1 MIL/uL (ref 3.87–5.11)
RDW: 14.2 % (ref 11.5–15.5)
WBC: 6.7 10*3/uL (ref 4.0–10.5)

## 2017-12-03 LAB — BASIC METABOLIC PANEL
Anion gap: 8 (ref 5–15)
BUN: 10 mg/dL (ref 8–23)
CALCIUM: 9 mg/dL (ref 8.9–10.3)
CO2: 30 mmol/L (ref 22–32)
CREATININE: 0.76 mg/dL (ref 0.44–1.00)
Chloride: 103 mmol/L (ref 98–111)
GFR calc Af Amer: >60 mL/min (ref >60)
GLUCOSE: 90 mg/dL (ref 70–99)
Potassium: 4.2 mmol/L (ref 3.5–5.1)
Sodium: 141 mmol/L (ref 135–145)

## 2017-12-03 MED ORDER — SODIUM CHLORIDE 0.9 % IV SOLN
INTRAVENOUS | Status: DC
  Administered 2017-12-03: 14:00:00 via INTRAVENOUS

## 2017-12-03 MED ORDER — ENSURE ENLIVE PO LIQD: 237.0000 mL | Freq: Two times a day (BID) | ORAL | Status: DC

## 2017-12-03 NOTE — Evaluation (Signed)
Physical Therapy Evaluation Patient Details Name: Deanna [name] MRN: [id number] DOB: October 15, 1945 Today's Date: 12/03/2017   History of Present Illness  Deanna [name] is a 72 y.o. female with medical history significant of advanced dementia, HTN, and HLD who presents from [place] for altered mental status. Her son was present at bedside and able to provide history. He reports that at her baseline, she is able to walk independently, feed herself, and respond to questions, however it is very difficult to understand her as she speaks softly and stammers. She is confused at baseline and does not always know who her son is or who she is. He was told by ALF staff that over the weekend his mother seemed weak on the left side and was not ambulating independently. She also appeared to be holding her mouth open and drooling. Staff was waiting for her to return to baseline, but she continued to be altered, so she was transported to the ED. The son reports that his mother seems "slightly below" her baseline currently as she is not walking by herself or interacting as much as normal.  Clinical Impression   Pt admitted with above diagnosis. Pt currently with functional limitations due to the deficits listed below (see PT Problem List). Ambulatory at her ALF proir to this recent decline; Presents with decr functional mobility,  decr activity tolerance, functional decline;  Pt will benefit from skilled PT to increase their independence and safety with mobility to allow discharge to the venue listed below.       Follow Up Recommendations SNF;Other (comment)(versus HHPT at her ALF)  If her ALF can meet her needs, I value getting her back to her familiar environment, routine, and caregivers; If her current ALF cannot meet her potentially changing needs, will need to consider SNF    Equipment Recommendations  Rolling walker with 5" wheels;3in1 (PT)    Recommendations for Other Services       Precautions /  Restrictions Precautions Precautions: Fall;Other (comment)(Droplet; DNR) Restrictions Weight Bearing Restrictions: No      Mobility  Bed Mobility Overal bed mobility: Needs Assistance Bed Mobility: Supine to Sit     Supine to sit: +2 for physical assistance;+2 for safety/equipment;Max assist     General bed mobility comments: +2 physical assist with LE's and trunk. Assisted with pad to scoot to EOB as pt was unable to follow commands.  Transfers Overall transfer level: Needs assistance Equipment used: 2 person hand held assist Transfers: Sit to/from Stand Sit to Stand: Max assist         General transfer comment: Pt was +1 max A for sit to stand and static standing and eventually required Mod A however she keeps her feet together in standing and w/ ambulation so would benefit from +2 for mobility/ambulation for safety.  Ambulation/Gait Ambulation/Gait assistance: Mod assist;+2 physical assistance Gait Distance (Feet): 10 Feet Assistive device: 2 person hand held assist Gait Pattern/deviations: Decreased step length - right;Decreased step length - left;Narrow base of support;Scissoring     General Gait Details: Heavy mod assist for balance while walking to the sink; very narrow step width at times scissoring; faciliatation of weight shift onto stance leg L and R to faciliate stepping  Stairs            Wheelchair Mobility    Modified Rankin (Stroke Patients Only)       Balance Overall balance assessment: Needs assistance Sitting-balance support: Feet unsupported;No upper extremity supported Sitting balance-Leahy Scale: Fair Sitting balance -  Comments: Multiple vc's to encourage pt placing her feet on the floor, however she was unable to follow this command despite multimodal cues & assist. Postural control: Posterior lean;Other (comment)(Feet and LE's together. Extremely narrow base of support despite physical attempts to increase space between feet in  standing)   Standing balance-Leahy Scale: Poor                               Pertinent Vitals/Pain Pain Assessment: Faces Faces Pain Scale: No hurt    Home Living Family/patient expects to be discharged to:: Skilled nursing facility(versus ALF)                 Additional Comments: Pt is unable to provide info    Prior Function Level of Independence: Needs assistance   Gait / Transfers Assistance Needed: Per chart review, pt is currently near baseline level. Pt unable to answer PLOF details and no family present. Per chart review, pt was ambulatory.  ADL's / Homemaking Assistance Needed: No family present to assist w/ PLOF, pt unable.        Hand Dominance        Extremity/Trunk Assessment   Upper Extremity Assessment Upper Extremity Assessment: Defer to OT evaluation    Lower Extremity Assessment Lower Extremity Assessment: Generalized weakness(Noted bil hip adductor tightness/tone)       Communication   Communication: Other (comment)(Little verbalization, mumbles/speaks very softly. Difficult to understand at times)  Cognition Arousal/Alertness: Awake/alert Behavior During Therapy: Flat affect(Keeps eyes closed for a good portion of PT/OT assessment. Opens briefly with encouragement ) Overall Cognitive Status: History of cognitive impairments - at baseline(No family present. H/o advanced dementia at baseline)                                        General Comments General comments (skin integrity, edema, etc.): Incontinent of urine standing at sink    Exercises     Assessment/Plan    PT Assessment Patient needs continued PT services(worth doing a trial of PT)  PT Problem List Decreased strength;Decreased range of motion;Decreased activity tolerance;Decreased balance;Decreased mobility;Decreased coordination;Decreased cognition;Decreased knowledge of use of DME;Decreased safety awareness;Decreased knowledge of precautions        PT Treatment Interventions DME instruction;Gait training;Functional mobility training;Therapeutic activities;Therapeutic exercise;Balance training;Neuromuscular re-education;Cognitive remediation;Patient/family education    PT Goals (Current goals can be found in the Care Plan section)  Acute Rehab PT Goals Patient Stated Goal: Pt unable, no family present PT Goal Formulation: Patient unable to participate in goal setting Time For Goal Achievement: 12/17/17 Potential to Achieve Goals: Fair    Frequency Min 2X/week   Barriers to discharge   I'm curious if her current ALF can accomodate her needs    Co-evaluation PT/OT/SLP Co-Evaluation/Treatment: Yes Reason for Co-Treatment: Complexity of the patient's impairments (multi-system involvement) PT goals addressed during session: Mobility/safety with mobility OT goals addressed during session: ADL's and self-care       AM-PAC PT "6 Clicks" Daily Activity  Outcome Measure Difficulty turning over in bed (including adjusting bedclothes, sheets and blankets)?: A Lot Difficulty moving from lying on back to sitting on the side of the bed? : Unable Difficulty sitting down on and standing up from a chair with arms (e.g., wheelchair, bedside commode, etc,.)?: Unable Help needed moving to and from a bed to chair (including a wheelchair)?: A  Lot Help needed walking in hospital room?: A Lot Help needed climbing 3-5 steps with a railing? : Total 6 Click Score: 9    End of Session Equipment Utilized During Treatment: Gait belt Activity Tolerance: Patient tolerated treatment well Patient left: in chair;with call bell/phone within reach;with chair alarm set Nurse Communication: Mobility status PT Visit Diagnosis: Other abnormalities of gait and mobility (R26.89);Muscle weakness (generalized) (M62.81)    Time: 5400-8676 PT Time Calculation (min) (ACUTE ONLY): 26 min   Charges:   PT Evaluation $PT Eval Moderate Complexity: 1 Mod           Roney Marion, [place]  Acute Rehabilitation Services Pager [phone] Office [phone]   Colletta [place] 12/03/2017, 12:52 PM

## 2017-12-03 NOTE — Clinical Social Work Note (Signed)
Patient medically stable for discharge back to [facility]F today. Patient, son -Elouise Divelbiss, and facility staff contacted and informed. Discharge clinicals transmitted to facility and reviewed by med tech [facility] [place]. Patient will be transported back to facility by non-emergency ambulance transport. CSW signing off as no other SW intervention services needed.  Vicki Pasqual Givens, MSW, LCSW Licensed Clinical Social Worker [place] [phone]

## 2017-12-03 NOTE — Progress Notes (Signed)
Pt alert & oriented x1 (self). Respiration even and unlabored.  No c/o pain or discomfort.  Afrebrile.IV removed. D/C paperwork provided and explained. Advise that if she had a temp>100.4, persistant dizziness, light-headedness, nausea and/or vomiting to please return to the ED. PTar took pt via stretcher to ALF.

## 2017-12-03 NOTE — Progress Notes (Signed)
Report called and given to RN at [place].  °

## 2017-12-03 NOTE — Evaluation (Signed)
Occupational Therapy Evaluation Patient Details Name: Deanna [name] MRN: [id number] DOB: 1945/05/17 Today's Date: 12/03/2017    History of Present Illness Deanna [name] is a 72 y.o. female with medical history significant of advanced dementia, HTN, and HLD who presents from [place] for altered mental status. Her son was present at bedside and able to provide history. He reports that at her baseline, she is able to walk independently, feed herself, and respond to questions, however it is very difficult to understand her as she speaks softly and stammers. She is confused at baseline and does not always know who her son is or who she is. He was told by ALF staff that over the weekend his mother seemed weak on the left side and was not ambulating independently. She also appeared to be holding her mouth open and drooling. Staff was waiting for her to return to baseline, but she continued to be altered, so she was transported to the ED. The son reports that his mother seems "slightly below" her baseline currently as she is not walking by herself or interacting as much as normal.   Clinical Impression   Pt admitted as above. Pt was assessed by OT. Pt is Max +1 for transfers and static standing, but would be safer with +2 assist during functional mobility/standing tasks during ADL's. Due to her level of dementia & inability to participate in skilled acute OT, she is most likely near or at baseline from an OT perspective. Plan is to return to nursing home & therefore do not currently recommend acute OT services at this time. Will sign off.    Follow Up Recommendations  No OT follow up;Supervision/Assistance - 24 hour    Equipment Recommendations  None recommended by OT    Recommendations for Other Services       Precautions / Restrictions Precautions Precautions: Fall;Other (comment)(Droplet; DNR) Restrictions Weight Bearing Restrictions: No      Mobility Bed Mobility Overal bed  mobility: Needs Assistance Bed Mobility: Supine to Sit     Supine to sit: +2 for physical assistance;+2 for safety/equipment;Max assist     General bed mobility comments: +2 physical assist with LE's and trunk. Assisted with pad to scoot to EOB as pt was unable to follow commands.  Transfers Overall transfer level: Needs assistance   Transfers: Sit to/from Stand Sit to Stand: Max assist         General transfer comment: Pt was +1 max A for sit to stand and static standing and eventually required Mod A however she keeps her feet together in standing and w/ ambulation so would benefit from +2 for mobility/ambulation for safety.    Balance Overall balance assessment: Needs assistance Sitting-balance support: Feet unsupported;No upper extremity supported Sitting balance-Leahy Scale: Fair Sitting balance - Comments: Multiple vc's to encourage pt placing her feet on the floor, however she was unable to follow this command despite multimodal cues & assist. Postural control: Posterior lean;Other (comment)(Feet and LE's together. Extremely narrow base of support despite physical attempts to increase space between feet in standing)   Standing balance-Leahy Scale: Poor                             ADL either performed or assessed with clinical judgement   ADL Overall ADL's : Needs assistance/impaired;At baseline Eating/Feeding: Total assistance;Bed level   Grooming: Wash/dry hands;Wash/dry face;Set up;Maximal assistance;Standing;Cueing for sequencing Grooming Details (indicate cue type and reason): Increased time, mod  verbal and tactile cues as pt has difficulty following commands. She was able to wash her face and hands w/ multimodal cues. Upper Body Bathing: Set up;Minimal assistance;Sitting   Lower Body Bathing: Total assistance;Sit to/from stand   Upper Body Dressing : Minimal assistance;Sitting   Lower Body Dressing: Total assistance;Sit to/from stand   Toilet  Transfer: BSC;Moderate assistance(Transferred from EOB to recliner chair with Mod A +1 for simulated toilet transfer) Toilet Transfer Details (indicate cue type and reason): Pt was noted to urinate on floor in standing at sink during grooming tasks. Toileting- Clothing Manipulation and Hygiene: Total assistance;Sit to/from stand       Functional mobility during ADLs: Maximal assistance;Cueing for safety;Cueing for sequencing(+1 hand held assist however would recommend +2 for ambulation) General ADL Comments: Pt seen for OT assessment followed by ADL retraining session with focus on grooming, bathing and dressing. She stood at sink for grooming to wash hands/face. She stands with extremely narrow base of support with her feet touching, PT attempted to increase her base of support but pt was noted to keep her feet together in standing and with ambulation. While up in standing, pt urinated on floor and was total assist for peri care and LB ADL's following this. While she required decreased asssistance in standing over time, she would be safer with +2 assist during functional mobility tasks. Due to her level of dementia, inability to participate in skilled acute OT, she is most likely near or at baseline from an OT perspective. Plan is to return to nursing home, do not currently recommend acute OT services at this time. Will sign off.     Vision Baseline Vision/History: (Unknown, pt unable )       Perception     Praxis      Pertinent Vitals/Pain Pain Assessment: Faces Faces Pain Scale: No hurt     Hand Dominance     Extremity/Trunk Assessment Upper Extremity Assessment Upper Extremity Assessment: Difficult to assess due to impaired cognition(Overall generalized weakness. Keeps UE's flexed at rest, but WFL's)   Lower Extremity Assessment Lower Extremity Assessment: Defer to PT evaluation       Communication Communication Communication: Other (comment)(Little verbalization,  mumbles/speaks very softly. Difficult to understand at times)   Cognition Arousal/Alertness: Awake/alert Behavior During Therapy: Flat affect(Keeps eyes closed for a good portion of PT/OT assessment. Opens briefly with encouragement ) Overall Cognitive Status: History of cognitive impairments - at baseline(No family present. H/o advanced dementia at baseline)                                     General Comments  Pt unable to provide assistance w/ PLOF due to h/o dementia, no family present.    Exercises     Shoulder Instructions      Home Living Family/patient expects to be discharged to:: Skilled nursing facility                                 Additional Comments: Pt is unable to provide info      Prior Functioning/Environment Level of Independence: Needs assistance  Gait / Transfers Assistance Needed: Per chart review, pt is currently near baseline level. Pt unable to answer PLOF details and no family present. Per chart review, pt was ambulatory. ADL's / Homemaking Assistance Needed: No family present to assist w/ PLOF, pt unable.  OT Problem List:        OT Treatment/Interventions:      OT Goals(Current goals can be found in the care plan section) Acute Rehab OT Goals Patient Stated Goal: Pt unable, no family present OT Goal Formulation: Patient unable to participate in goal setting(All assessment and education complete, D/C therapy at this time as pt appears at or near baseline, she is from nursing home and anticipate d/c back to nursing home when medically able.)  OT Frequency:     Barriers to D/C:            Co-evaluation PT/OT/SLP Co-Evaluation/Treatment: Yes Reason for Co-Treatment: Complexity of the patient's impairments (multi-system involvement);For patient/therapist safety PT goals addressed during session: Mobility/safety with mobility;Balance OT goals addressed during session: ADL's and self-care      AM-PAC  PT "6 Clicks" Daily Activity     Outcome Measure Help from another person eating meals?: A Lot Help from another person taking care of personal grooming?: A Little Help from another person toileting, which includes using toliet, bedpan, or urinal?: Total Help from another person bathing (including washing, rinsing, drying)?: A Lot Help from another person to put on and taking off regular upper body clothing?: A Little Help from another person to put on and taking off regular lower body clothing?: Total 6 Click Score: 12   End of Session Equipment Utilized During Treatment: Gait belt;Other (comment)(+1-+2 hand held assist during assessment) Nurse Communication: Mobility status;Precautions  Activity Tolerance: Patient tolerated treatment well Patient left: in chair;with call bell/phone within reach;with chair alarm set;with nursing/sitter in room  OT Visit Diagnosis: Other symptoms and signs involving cognitive function                Time: 7425-9563 OT Time Calculation (min): 26 min Charges:  OT General Charges $OT Visit: 1 Visit OT Evaluation $OT Eval Moderate Complexity: 1 Mod OT Treatments $Self Care/Home Management : 8-22 mins   [place], Amy Beth Dixon, OTR/L 12/03/2017, 11:05 AM

## 2017-12-03 NOTE — NC FL2 (Addendum)
[place] LEVEL OF CARE SCREENING TOOL     IDENTIFICATION  Patient Name: Deanna [name] Birthdate: [birth year] Sex: female Admission Date (Current Location): 12/02/2017  [facility] and [place] Number:  Herbalist and Address:  [facility], [place] [address], [place], [place] [address]      Provider Number: [id number]  Attending Physician Name and Address:  Annia Belt, MD  Relative Name and Phone Number:  Charisse Klinefelter - son, [phone]    Current Level of Care: Hospital Recommended Level of Care: Assisted Living [facility]) Prior Approval Number:    Date Approved/Denied:   PASRR Number: [id number]  Discharge Plan: Other (Comment)(ALF)    Current Diagnoses: Patient Active Problem List   Diagnosis Date Noted  . Generalized weakness 12/02/2017  . Dehydration   . Advance care planning   . Goals of care, counseling/discussion   . Malnutrition of moderate degree 09/05/2017  . Altered mental status, unspecified 09/04/2017  . Dementia with behavioral disturbance ([place]) 08/04/2015  . Increased ammonia level 06/15/2014  . Left flank pain 06/15/2014  . Cognitive changes 03/24/2014  . Fatigue 03/24/2014  . Vitreous floaters of right eye 03/11/2014  . Underweight 03/11/2014  . HTN (hypertension) 12/16/2013  . HLD (hyperlipidemia) 12/16/2013  . Dementia ([place]) 12/16/2013    Orientation RESPIRATION BLADDER Height & Weight     Self  Normal External catheter Weight: 120 lb (54.4 kg) Height:  5\' 6"  (167.6 cm)  BEHAVIORAL SYMPTOMS/MOOD NEUROLOGICAL BOWEL NUTRITION STATUS      Continent Regular (no pork)  AMBULATORY STATUS COMMUNICATION OF NEEDS Skin   Extensive Assist Non-Verbally(Patient's speech Incomprehensible) Other (Comment)                       Personal Care Assistance Level of Assistance  Bathing, Feeding, Dressing Bathing Assistance: Maximum assistance Feeding assistance: Maximum  assistance Dressing Assistance: Maximum assistance     Functional Limitations Info  Sight, Hearing, Speech Sight Info: Adequate Hearing Info: Adequate Speech Info: Impaired    SPECIAL CARE FACTORS FREQUENCY  PT (By licensed PT), OT (By licensed OT)     PT Frequency: Evaluated 10/1 OT Frequency: Evaluated 10/1            Contractures Contractures Info: Not present    Additional Factors Info  Code Status Code Status Info: DNR             Current Medications (12/03/2017):  This is the current hospital active medication list Current Facility-Administered Medications  Medication Dose Route Frequency Provider Last Rate Last Dose  . 0.9 %  sodium chloride infusion   Intravenous PRN Valinda Party, DO   Stopped at 12/02/17 1656  . 0.9 %  sodium chloride infusion   Intravenous PRN Valinda Party, DO   Stopped at 12/02/17 1655  . 0.9 %  sodium chloride infusion   Intravenous Continuous Hoffman, Jessica Ratliff, DO      . acetaminophen (TYLENOL) tablet 650 mg  650 mg Oral Q6H PRN Valinda Party, DO       Or  . acetaminophen (TYLENOL) suppository 650 mg  650 mg Rectal Q6H PRN Kalman Shan Ratliff, DO      . buPROPion (WELLBUTRIN XL) 24 hr tablet 150 mg  150 mg Oral Daily Kalman Shan Ratliff, DO   150 mg at 12/03/17 1107  . feeding supplement (ENSURE ENLIVE) (ENSURE ENLIVE) liquid 237 mL  237 mL Oral BID BM Annia Belt,  MD      . mirtazapine (REMERON) tablet 15 mg  15 mg Oral QHS Hoffman, Jessica Ratliff, DO   15 mg at 12/03/17 0018  . OLANZapine (ZYPREXA) tablet 5 mg  5 mg Oral QHS Hoffman, Jessica Ratliff, DO   5 mg at 12/03/17 0019  . senna-docusate (Senokot-S) tablet 1 tablet  1 tablet Oral QHS PRN Valinda Party, DO   1 tablet at 12/03/17 0019  . sertraline (ZOLOFT) tablet 100 mg  100 mg Oral Daily Kalman Shan Ratliff, DO   100 mg at 12/03/17 1106     Discharge Medications: Please see discharge summary for a list  of discharge medications.  Relevant Imaging Results:  Relevant Lab Results:   Additional Information ss#[id number].  Discharge Medications: STOP taking these medications      cephALEXin 500 MG capsule Commonly known as:  KEFLEX           TAKE these medications      buPROPion 150 MG 24 hr tablet Commonly known as:  WELLBUTRIN XL Take 1 tablet (150 mg total) by mouth daily.   feeding supplement (ENSURE ENLIVE) Liqd Take 237 mLs by mouth 3 (three) times daily between meals.    lactase 3000 units tablet Commonly known as:  LACTAID Take 9,000 Units by mouth 3 (three) times daily before meals.    mirtazapine 15 MG tablet Commonly known as:  REMERON Take 15 mg by mouth at bedtime.    OLANZapine 5 MG tablet Commonly known as:  ZYPREXA Take 1 tablet (5 mg total) by mouth at bedtime.    polyethylene glycol packet Commonly known as:  MIRALAX / GLYCOLAX Take 17 g by mouth daily as needed (constipation).    senna-docusate 8.6-50 MG tablet Commonly known as:  Senokot-S Take 1 tablet by mouth 2 (two) times daily.    sertraline 100 MG tablet Commonly known as:  ZOLOFT Take 100 mg by mouth daily.    simvastatin 40 MG tablet Commonly known as:  ZOCOR Take 40 mg by mouth 2 (two) times daily.    THERA Tabs Take 1 tablet by mouth daily.         Sable Feil, LCSW

## 2017-12-03 NOTE — Discharge Summary (Addendum)
Name: Deanna [name] MRN: [id number] DOB: 04-12-1945 72 y.o. PCP: System, Pcp Not In  Date of Admission: 12/02/2017 11:50 AM Date of Discharge:  Attending Physician: Annia Belt, MD  Discharge Diagnosis: 1. Generalized weakness  Discharge Medications: Allergies as of 12/03/2017      Reactions   Pork-derived Products       Medication List    STOP taking these medications   cephALEXin 500 MG capsule Commonly known as:  KEFLEX     TAKE these medications   buPROPion 150 MG 24 hr tablet Commonly known as:  WELLBUTRIN XL Take 1 tablet (150 mg total) by mouth daily.   feeding supplement (ENSURE ENLIVE) Liqd Take 237 mLs by mouth 3 (three) times daily between meals.   lactase 3000 units tablet Commonly known as:  LACTAID Take 9,000 Units by mouth 3 (three) times daily before meals.   mirtazapine 15 MG tablet Commonly known as:  REMERON Take 15 mg by mouth at bedtime.   OLANZapine 5 MG tablet Commonly known as:  ZYPREXA Take 1 tablet (5 mg total) by mouth at bedtime.   polyethylene glycol packet Commonly known as:  MIRALAX / GLYCOLAX Take 17 g by mouth daily as needed (constipation).   senna-docusate 8.6-50 MG tablet Commonly known as:  Senokot-S Take 1 tablet by mouth 2 (two) times daily.   sertraline 100 MG tablet Commonly known as:  ZOLOFT Take 100 mg by mouth daily.   simvastatin 40 MG tablet Commonly known as:  ZOCOR Take 40 mg by mouth 2 (two) times daily.   THERA Tabs Take 1 tablet by mouth daily.       Disposition and follow-up:   Deanna [name] was discharged from [facility] in Stable condition.  At the hospital follow up visit please address:  1.  Weakness/rigidity: Her minor change in mental status is most likely due to either multiple centrally acting medications or progressive dementia. ALF staff should consider either adding donepezil back to her regimen or decreasing her Zyprexa dose given the rigidity noted on  exam.  2.  Labs / imaging needed at time of follow-up: Follow-up CXR in 3-4 weeks for possible right upper hemithorax pulmonary nodule.   3.  Pending labs/ test needing follow-up: none  Follow-up Appointments:   Hospital Course by problem list: 1. Generalized weakness: Deanna [name] is a 72 yo female with a medical history of advanced dementia, HTN, and HLD who presented from her ALF for observed weakness with walking and drooling that started two days prior to arrival. According to her son, she only appeared "slightly below" baseline and was having more difficulty walking than normal. At her baseline she has quiet, garbled speech, walks independently, eats independently, and does not follow commands. Lab work normal. CT head without acute abnormalities. There was initial concern for pneumonia, however the patient was afebrile, without leukocytosis, and CXR was equivocal for focal opacity. No other signs or symptoms of infection. No concern for stroke given lack of focal neurological deficits. Encephalopathy workup in June when she was admitted for an infection showed normal brain MRI, ammonia, B12, and TSH. Her minor change in mental status is most likely due to either multiple centrally acting medications or progressive dementia. ALF staff should consider either adding donepezil back to her regimen or decreasing her Zyprexa dose given the rigidity noted on exam. This would be better done in her ALF rather than in the hospital so that the patient can be in comfortable, familiar  surroundings. The patient was gently hydrated and was evaluated by PT who recommended return to ALF. Her ALF is well-equipped to handle her disabilities according to staff.  Discharge Vitals:   BP 135/72 (BP Location: Left Arm)   Pulse 72   Temp 97.6 F (36.4 C) (Oral)   Resp (!) 21   Ht 5\' 6"  (1.676 m)   Wt 54.4 kg   SpO2 98%   BMI 19.36 kg/m   Pertinent Labs, Studies, and Procedures:  CBC Latest Ref Rng & Units  12/03/2017 12/02/2017 09/05/2017  WBC 4.0 - 10.5 K/uL 6.7 7.9 8.6  Hemoglobin 12.0 - 15.0 g/dL 14.0 13.4 13.1  Hematocrit 36.0 - 46.0 % 45.6 44.0 41.7  Platelets 150 - 400 K/uL 590(H) 608(H) 555(H)   CMP Latest Ref Rng & Units 12/03/2017 12/02/2017 09/07/2017  Glucose 70 - 99 mg/dL 90 97 120(H)  BUN 8 - 23 mg/dL 10 16 11   Creatinine 0.44 - 1.00 mg/dL 0.76 0.88 0.57  Sodium 135 - 145 mmol/L 141 144 143  Potassium 3.5 - 5.1 mmol/L 4.2 3.8 3.9  Chloride 98 - 111 mmol/L 103 102 104  CO2 22 - 32 mmol/L 30 30 30   Calcium 8.9 - 10.3 mg/dL 9.0 9.0 8.7(L)  Total Protein 6.5 - 8.1 g/dL - 6.6 -  Total Bilirubin 0.3 - 1.2 mg/dL - 0.6 -  Alkaline Phos 38 - 126 U/L - 64 -  AST 15 - 41 U/L - 24 -  ALT 0 - 44 U/L - 16 -   CXR Patchy opacities right upper hemithorax may represent infection. Underlying nodule not excluded. Followup PA and lateral chest X-ray is recommended in 3-4 weeks following trial of antibiotic therapy to ensure resolution and exclude underlying malignancy.  Head CT: no acute findings. Stable from 09/04/2017.  Discharge Instructions: Discharge Instructions    Discharge instructions   Complete by:  As directed    It was a pleasure taking care of Deanna [name] during her hospitalization.  She has no signs of infection or stroke and is appropriate for discharge back to her ALF. Her observed weakness is likely the result of multiple centrally-acting medications or progression of her dementia. ALF staff should consider either adding donepezil back to her regimen or decreasing her Zyprexa dose given the rigidity noted on exam. This would be better done in her ALF rather than in the hospital so that the patient can be in comfortable, familiar surroundings.   Please call our clinic at [phone] if you have any questions.  Dr. Annie Paras      Signed: Dorrell, Andree Elk, MD 12/03/2017, 2:48 PM   Pager: [phone]

## 2017-12-03 NOTE — Progress Notes (Signed)
   Subjective: No overnight events. Ms. Dietz is difficulty to wake this morning. She does not follow commands or respond intelligibly to questions (as is her baseline). She does say "yes" in response to the question of if she would like breakfast.   Objective:  Vital signs in last 24 hours: Vitals:   12/02/17 1730 12/02/17 2054 12/03/17 0550 12/03/17 0900  BP: 120/66 138/78 (!) 174/93 135/72  Pulse: 78 94 69 72  Resp: 18 20 20  (!) 21  Temp: 98.1 F (36.7 C) 97.8 F (36.6 C) 97.7 F (36.5 C) 97.6 F (36.4 C)  TempSrc: Oral Oral Oral Oral  SpO2: 100% 100% (!) 80% 98%  Weight:      Height:       Constitutional: Thin, lying comfortably in bed, no distress.  Neuro: Difficult to wake. Does not follow command to open eyes. Resists eyelid left. PERRL. Rigidity of all 4 extremities. Able to withdraw all four extremities. No facial droop or asymmetric strength. HEENT: Normocephalic, atraumatic. Cardiovascular:Normal rateand regular rhythm. No murmurs, rubs, or gallops. Abdominal: Bowel sounds present. Soft, non-distended, non-tender. Ext: No lower extremity edema. Skin: Warm and dry. No rashes or wounds.  Assessment/Plan:  Active Problems:   Generalized weakness  Ms. Doyle is a 72 yo female with a medical history of advanced dementia, HTN, and HLD who presented from her ALF for observed weakness with walking and drooling that started two days prior to arrival. Patient is currently afebrile and hemodynamically stable.  Generalized Weakness - Mental status/weakness unchanged today. Slightly decreased from baseline according to her son. No signs or symptoms of infection or CVA. Medication side effects vs. Progressive dementia. Her recent discontinuation of donepezil could cause rigidity or worsening dementia. Her Zyprexa may also be causing rigidity. Will make no medication changes during this admission as these will require slow adjustments and careful monitoring. The patient will  likely due better in a familiar setting rather than in the hospital.  - Patient was gently hydrated overnight. - Patient evaluated by PT who recommend return to ALF if able to handle her needs vs SNF. Social work contacted ALF who is able to take her back and care for her disabilities. Plan - discharge back to ALF today  Dispo: Anticipated discharge today  Dorrell, Andree Elk, MD 12/03/2017, 11:24 AM Pager: [phone]

## 2017-12-03 NOTE — Progress Notes (Signed)
Initial Nutrition Assessment  DOCUMENTATION CODES:   Non-severe (moderate) malnutrition in context of chronic illness  INTERVENTION:   Ensure Enlive po BID, each supplement provides 350 kcal and 20 grams of protein  Downgrade diet to Dysphagia III for easy of chewing as pt is edentulous   NUTRITION DIAGNOSIS:   Moderate Malnutrition related to chronic illness(advanced dementia) as evidenced by mild fat depletion, mild muscle depletion.  GOAL:   Patient will meet greater than or equal to 90% of their needs  MONITOR:   PO intake, Supplement acceptance, Labs, Weight trends  REASON FOR ASSESSMENT:   Malnutrition Screening Tool    ASSESSMENT:   72 yo female admitted with generalized weakness. Urine tox screen negative. PMH includes advanced dementia, HTN, HLD. At baseline, pt is able to walk independently, feed herself and respond to questions but is confused   Pt sleeping on visit today, did not even wake up during physical exam. Per CNA, pt was very alert during breakfast this AM Recorded po intake 25% at breakfast this AM, 0% at dinner last night  Unable to obtain diet and weight history.  Pt is edentulous; noted pt on Dysphagia III diet on previous admissions  Unsure if current admission wt is stated/estimated or measured; no weight today  Labs: reviewed Meds: remeron, zoloft  NUTRITION - FOCUSED PHYSICAL EXAM:    Most Recent Value  Orbital Region  Moderate depletion  Upper Arm Region  Mild depletion  Thoracic and Lumbar Region  Moderate depletion  Buccal Region  Moderate depletion  Temple Region  Moderate depletion  Clavicle Bone Region  Moderate depletion  Clavicle and Acromion Bone Region  Moderate depletion  Scapular Bone Region  Moderate depletion  Dorsal Hand  Moderate depletion  Patellar Region  Mild depletion  Anterior Thigh Region  Mild depletion  Posterior Calf Region  Mild depletion       Diet Order:   Diet Order            Diet regular  Room service appropriate? Yes; Fluid consistency: Thin  Diet effective now              EDUCATION NEEDS:   Not appropriate for education at this time  Skin:  Skin Assessment: Reviewed RN Assessment  Last BM:  no documented BM  Height:   Ht Readings from Last 1 Encounters:  12/02/17 5\' 6"  (1.676 m)    Weight:   Wt Readings from Last 1 Encounters:  12/02/17 54.4 kg    Ideal Body Weight:  59 kg  BMI:  Body mass index is 19.37 kg/m.  Estimated Nutritional Needs:   Kcal:  1650-1880 kcals   Protein:  80-95 g  Fluid:  >/= 1.7 L   Kerman Passey MS, RD, LDN, CNSC [phone] Pager  [phone] Weekend/On-Call Pager

## 2017-12-03 NOTE — Clinical Social Work Note (Addendum)
Clinical Social Work Assessment  Patient Details  Name: Deanna [name] MRN: [id number] Date of Birth: Sep 04, 1945  Date of referral:  12/03/17               Reason for consult:  Discharge Planning                Permission sought to share information with:  Family Supports Permission granted to share information::  No(Patient oriented to self only)  Name::        Agency::     Relationship::     Contact Information:     Housing/Transportation Living arrangements for the past 2 months:  Assisted Living Facility([facility]) Source of Information:  Adult Children(Son Deanna [name]) Patient Interpreter Needed:  None Criminal Activity/Legal Involvement Pertinent to Current Situation/Hospitalization:  No - Comment as needed Significant Relationships:  Adult Children Lives with:  Facility Resident([facility]) Do you feel safe going back to the place where you live?  Yes(Son is agreeable to patient returning if they feel that can meet her needs) Need for family participation in patient care:  Yes (Comment)  Care giving concerns:CSW talked with patient's son Deanna [name] [phone]) regarding patient's discharge disposition once medically stable. Son agreeable to patient returning to ALF if they can meet her needs or to a SNF if this is needed. Patient requested and was emailed ([email or web address]) SNF list for [facility].  Social Worker assessment / plan:  CSW talked with patient's son regarding patient's discharge disposition and reviewed PT note with him. Son indicated that he is agreeable with what is best for patient. SNF for ST rehab discussed and SNF list mailed to son for review in case this is the discharge plan.   Son reported that when his mom discharged back to ALF in July she was in the bed a couple of days, then was up and able to move about slowly. Son is hopeful that patient can return to ALF. Deanna [name] advised to contact CSW with any questions.  CSW  talked with Deanna [name], med tech at [facility] regarding patient. Per Deanna [name], Deanna [name] is total care with eating and other ADL's. Deanna [name] indicated that she and another med tech, Deanna [name] work with patient during the week.  Employment status:  Retired Forensic scientist:  Medicare PT Recommendations:  Skilled Nursing Facility(Back to ALF if they can meet her needs, or SNF for ST rehab if ALF feels they can meet patient's needs post-discharge.) Information / Referral to community resources:  Skilled Nursing Facility(SNF list emailed to son)  Patient/Family's Response to care:  No concerns expressed regarding patient's care during hospitalization.  Patient/Family's Understanding of and Emotional Response to Diagnosis, Current Treatment, and Prognosis: Son appears knowledgeable regarding his mother's need for this hospitalization and wants which ever discharge plan is best for her.  Emotional Assessment Appearance:  Appears stated age Attitude/Demeanor/Rapport:  Unable to Assess(Patient oriented to self only) Affect (typically observed):  Unable to Assess(Did not engage with patient due to orientation status) Orientation:  Oriented to Self Alcohol / Substance use:  Never Used Psych involvement (Current and /or in the community):  No (Comment)  Discharge Needs  Concerns to be addressed:  Discharge Planning Concerns(Return to ALF versus SNF for ST rehabheab) Readmission within the last 30 days:  No Current discharge risk:  None Barriers to Discharge:  Continued Medical Work up   [employer], LCSW 12/03/2017, 1:25 PM

## 2017-12-09 ENCOUNTER — Emergency Department (HOSPITAL_COMMUNITY): Payer: Medicare Other

## 2017-12-09 ENCOUNTER — Emergency Department (HOSPITAL_COMMUNITY)
Admission: EM | Admit: 2017-12-09 | Discharge: 2017-12-09 | Disposition: A | Discharge status: Home / Self Care | Payer: Medicare Other | Source: Home / Self Care | Attending: Emergency Medicine | Admitting: Emergency Medicine

## 2017-12-09 DIAGNOSIS — I10 Essential (primary) hypertension: Secondary | ICD-10-CM

## 2017-12-09 DIAGNOSIS — F0390 Unspecified dementia without behavioral disturbance: Secondary | ICD-10-CM

## 2017-12-09 DIAGNOSIS — W19XXXA Unspecified fall, initial encounter: Secondary | ICD-10-CM

## 2017-12-09 DIAGNOSIS — W06XXXA Fall from bed, initial encounter: Secondary | ICD-10-CM | POA: Insufficient documentation

## 2017-12-09 DIAGNOSIS — Z79899 Other long term (current) drug therapy: Secondary | ICD-10-CM

## 2017-12-09 DIAGNOSIS — M542 Cervicalgia: Secondary | ICD-10-CM | POA: Insufficient documentation

## 2017-12-09 DIAGNOSIS — R51 Headache: Secondary | ICD-10-CM | POA: Insufficient documentation

## 2017-12-09 DIAGNOSIS — S72002A Fracture of unspecified part of neck of left femur, initial encounter for closed fracture: Secondary | ICD-10-CM | POA: Diagnosis not present

## 2017-12-09 NOTE — ED Notes (Signed)
Bed: WA07 Expected date:  Expected time:  Means of arrival:  Comments: EMS 

## 2017-12-09 NOTE — ED Provider Notes (Signed)
[facility] DEPT Provider Note   CSN: [id number] Arrival date & time: 12/09/17  4034     History   Chief Complaint Chief Complaint  Patient presents with  . Fall    HPI Shemica Meath is a 72 y.o. female.  HPI  Level 5 caveat for severe dementia.  72 year old female comes in with chief complaint of fall. Patient resides at a nursing home, and she was found on the floor.  It appeared to the nursing home that patient had rolled off of her bed.  Patient is moving all 4 extremities and has no complaints.  Past Medical History:  Diagnosis Date  . Dementia Dx 2015  . Hyperlipidemia DX 2015  . Hypertension Dx 2015    Patient Active Problem List   Diagnosis Date Noted  . Generalized weakness 12/02/2017  . Dehydration   . Advance care planning   . Goals of care, counseling/discussion   . Malnutrition of moderate degree 09/05/2017  . Altered mental status, unspecified 09/04/2017  . Dementia with behavioral disturbance ([place]) 08/04/2015  . Increased ammonia level 06/15/2014  . Left flank pain 06/15/2014  . Cognitive changes 03/24/2014  . Fatigue 03/24/2014  . Vitreous floaters of right eye 03/11/2014  . Underweight 03/11/2014  . HTN (hypertension) 12/16/2013  . HLD (hyperlipidemia) 12/16/2013  . Dementia ([place]) 12/16/2013    Past Surgical History:  Procedure Laterality Date  . Luna      OB History   None      Home Medications    Prior to Admission medications   Medication Sig Start Date End Date Taking? Authorizing Provider  buPROPion (WELLBUTRIN XL) 150 MG 24 hr tablet Take 1 tablet (150 mg total) by mouth daily. 10/19/15 12/09/17 Yes Noel, Tiffany S, PA-C  feeding supplement, ENSURE ENLIVE, (ENSURE ENLIVE) LIQD Take 237 mLs by mouth 3 (three) times daily between meals. 09/07/17  Yes Hosie Poisson, MD  lactase (LACTAID) 3000 units tablet Take 9,000 Units by mouth 3 (three) times daily before meals.   Yes  [provider]  mirtazapine (REMERON) 15 MG tablet Take 15 mg by mouth at bedtime.   Yes [provider]  Multiple Vitamin (THERA) TABS Take 1 tablet by mouth daily.    Yes [provider]  OLANZapine (ZYPREXA) 5 MG tablet Take 1 tablet (5 mg total) by mouth at bedtime. 11/03/15  Yes Funches, Josalyn, MD  polyethylene glycol (MIRALAX / GLYCOLAX) packet Take 17 g by mouth daily as needed (constipation).   Yes [provider]  senna-docusate (SENOKOT-S) 8.6-50 MG tablet Take 1 tablet by mouth 2 (two) times daily.   Yes [provider]  sertraline (ZOLOFT) 100 MG tablet Take 100 mg by mouth daily.   Yes [provider]  simvastatin (ZOCOR) 40 MG tablet Take 40 mg by mouth 2 (two) times daily.   Yes [provider]    Family History Family History  Problem Relation Age of Onset  . Cancer Neg Hx   . Heart disease Neg Hx   . Dementia Neg Hx     Social History Social History   Tobacco Use  . Smoking status: Never Smoker  . Smokeless tobacco: Never Used  Substance Use Topics  . Alcohol use: No  . Drug use: No     Allergies   Pork-derived products   Review of Systems Review of Systems  Unable to perform ROS: Dementia     Physical Exam Updated Vital Signs BP Marland Kitchen)  167/115   Pulse (!) 115   Temp 98 F (36.7 C) (Oral)   Resp 16   SpO2 100%   Physical Exam  Constitutional: She appears well-developed.  HENT:  Head: Normocephalic and atraumatic.  Eyes: EOM are normal.  Neck: Normal range of motion. Neck supple.  Cardiovascular: Normal rate.  Pulmonary/Chest: Effort normal.  Abdominal: Bowel sounds are normal.  Musculoskeletal:  Head to toe evaluation shows no hematoma, bleeding of the scalp, no facial abrasions, no spine step offs, crepitus of the chest or neck, no tenderness to palpation of the bilateral upper and lower extremities, no gross deformities, no chest tenderness, no pelvic pain.   Neurological: She  is alert.  Skin: Skin is warm and dry.  Nursing note and vitals reviewed.    ED Treatments / Results  Labs (all labs ordered are listed, but only abnormal results are displayed) Labs Reviewed - No data to display  EKG None  Radiology Ct Head Wo Contrast  Result Date: 12/09/2017 CLINICAL DATA:  Fall, head and neck pain EXAM: CT HEAD WITHOUT CONTRAST CT CERVICAL SPINE WITHOUT CONTRAST TECHNIQUE: Multidetector CT imaging of the head and cervical spine was performed following the standard protocol without intravenous contrast. Multiplanar CT image reconstructions of the cervical spine were also generated. COMPARISON:  12/02/2017 FINDINGS: CT HEAD FINDINGS Brain: There is atrophy and chronic small vessel disease changes. No acute intracranial abnormality. Specifically, no hemorrhage, hydrocephalus, mass lesion, acute infarction, or significant intracranial injury. Vascular: No hyperdense vessel or unexpected calcification. Skull: No acute calvarial abnormality. Sinuses/Orbits: Visualized paranasal sinuses and mastoids clear. Orbital soft tissues unremarkable. Other: None CT CERVICAL SPINE FINDINGS Alignment: Normal Skull base and vertebrae: No acute fracture. No primary bone lesion or focal pathologic process. Soft tissues and spinal canal: No prevertebral fluid or swelling. No visible canal hematoma. Disc levels: Moderate degenerative disc and facet disease. Disc space narrowing diffusely. Mild anterior and posterior spurring. Upper chest: No acute findings Other: No acute findings IMPRESSION: No acute intracranial abnormality. Atrophy, chronic microvascular disease. Degenerative disc and facet disease diffusely throughout the cervical spine. No acute bony abnormality. Electronically Signed   By: Rolm Baptise M.D.   On: 12/09/2017 09:35   Ct Cervical Spine Wo Contrast  Result Date: 12/09/2017 CLINICAL DATA:  Fall, head and neck pain EXAM: CT HEAD WITHOUT CONTRAST CT CERVICAL SPINE WITHOUT CONTRAST  TECHNIQUE: Multidetector CT imaging of the head and cervical spine was performed following the standard protocol without intravenous contrast. Multiplanar CT image reconstructions of the cervical spine were also generated. COMPARISON:  12/02/2017 FINDINGS: CT HEAD FINDINGS Brain: There is atrophy and chronic small vessel disease changes. No acute intracranial abnormality. Specifically, no hemorrhage, hydrocephalus, mass lesion, acute infarction, or significant intracranial injury. Vascular: No hyperdense vessel or unexpected calcification. Skull: No acute calvarial abnormality. Sinuses/Orbits: Visualized paranasal sinuses and mastoids clear. Orbital soft tissues unremarkable. Other: None CT CERVICAL SPINE FINDINGS Alignment: Normal Skull base and vertebrae: No acute fracture. No primary bone lesion or focal pathologic process. Soft tissues and spinal canal: No prevertebral fluid or swelling. No visible canal hematoma. Disc levels: Moderate degenerative disc and facet disease. Disc space narrowing diffusely. Mild anterior and posterior spurring. Upper chest: No acute findings Other: No acute findings IMPRESSION: No acute intracranial abnormality. Atrophy, chronic microvascular disease. Degenerative disc and facet disease diffusely throughout the cervical spine. No acute bony abnormality. Electronically Signed   By: Rolm Baptise M.D.   On: 12/09/2017 09:35    Procedures Procedures (including critical care  time)  Medications Ordered in ED Medications - No data to display   Initial Impression / Assessment and Plan / ED Course  I have reviewed the triage vital signs and the nursing notes.  Pertinent labs & imaging results that were available during my care of the patient were reviewed by me and considered in my medical decision making (see chart for details).     72 year old female comes in with chief complaint of fall.  Patient is demented, unable to give any meaningful history.  On our exam there is  no evidence of gross deformities and patient is moving all 4 extremities and is alert. CT head and C-spine had to be ordered because it is difficult to clear this patient clinically because of her severe dementia.  Imaging is negative for acute process.  We will discharge.  Final Clinical Impressions(s) / ED Diagnoses   Final diagnoses:  Fall, initial encounter    ED Discharge Orders    None       Varney Biles, MD 12/09/17 1114

## 2017-12-09 NOTE — ED Triage Notes (Signed)
Transported by Charisse Klinefelter from [facility]--unwitnessed fall this morning. Patient rolled out of bed, and was found on her left side. Patient has obvious injury to left knee and left temple. AAO x 1 per baseline. VSS with EMS. CBG--112 mg/dl. +C-Collar

## 2017-12-09 NOTE — ED Notes (Signed)
[facility] has been contacted regarding patient transport.  

## 2017-12-09 NOTE — Discharge Instructions (Signed)
We saw you in the ER after you had a fall. °All the imaging results are normal, no fractures seen. No evidence of brain bleed. °Please be very careful with walking, and do everything possible to prevent falls. ° ° °

## 2017-12-12 ENCOUNTER — Encounter (HOSPITAL_COMMUNITY): Payer: Self-pay

## 2017-12-12 ENCOUNTER — Emergency Department (HOSPITAL_COMMUNITY): Payer: Medicare Other

## 2017-12-12 ENCOUNTER — Inpatient Hospital Stay (HOSPITAL_COMMUNITY)
Admission: EM | Admit: 2017-12-12 | Discharge: 2017-12-17 | DRG: 470 | Disposition: A | Discharge status: Skilled Nursing Facility | Payer: Medicare Other | Source: Skilled Nursing Facility | Attending: Family Medicine | Admitting: Family Medicine

## 2017-12-12 DIAGNOSIS — S72002A Fracture of unspecified part of neck of left femur, initial encounter for closed fracture: Secondary | ICD-10-CM | POA: Diagnosis present

## 2017-12-12 DIAGNOSIS — F0391 Unspecified dementia with behavioral disturbance: Secondary | ICD-10-CM | POA: Diagnosis present

## 2017-12-12 DIAGNOSIS — F418 Other specified anxiety disorders: Secondary | ICD-10-CM | POA: Diagnosis present

## 2017-12-12 DIAGNOSIS — Z66 Do not resuscitate: Secondary | ICD-10-CM | POA: Diagnosis present

## 2017-12-12 DIAGNOSIS — W06XXXA Fall from bed, initial encounter: Secondary | ICD-10-CM | POA: Diagnosis present

## 2017-12-12 DIAGNOSIS — E785 Hyperlipidemia, unspecified: Secondary | ICD-10-CM | POA: Diagnosis present

## 2017-12-12 DIAGNOSIS — Z91018 Allergy to other foods: Secondary | ICD-10-CM | POA: Diagnosis not present

## 2017-12-12 DIAGNOSIS — Z96649 Presence of unspecified artificial hip joint: Secondary | ICD-10-CM

## 2017-12-12 DIAGNOSIS — Y92003 Bedroom of unspecified non-institutional (private) residence as the place of occurrence of the external cause: Secondary | ICD-10-CM

## 2017-12-12 DIAGNOSIS — F03918 Unspecified dementia, unspecified severity, with other behavioral disturbance: Secondary | ICD-10-CM | POA: Diagnosis present

## 2017-12-12 DIAGNOSIS — R339 Retention of urine, unspecified: Secondary | ICD-10-CM | POA: Diagnosis not present

## 2017-12-12 DIAGNOSIS — I10 Essential (primary) hypertension: Secondary | ICD-10-CM | POA: Diagnosis present

## 2017-12-12 LAB — BASIC METABOLIC PANEL
ANION GAP: 13 (ref 5–15)
BUN: 12 mg/dL (ref 8–23)
CO2: 29 mmol/L (ref 22–32)
CREATININE: 0.79 mg/dL (ref 0.44–1.00)
Calcium: 9.1 mg/dL (ref 8.9–10.3)
Chloride: 101 mmol/L (ref 98–111)
GFR calc non Af Amer: >60 mL/min (ref >60)
Glucose, Bld: 111 mg/dL — ABNORMAL HIGH (ref 70–99)
Potassium: 4 mmol/L (ref 3.5–5.1)
SODIUM: 143 mmol/L (ref 135–145)

## 2017-12-12 LAB — TYPE AND SCREEN
ABO/RH(D): A POS
Antibody Screen: NEGATIVE

## 2017-12-12 LAB — CBC WITH DIFFERENTIAL/PLATELET
Abs Immature Granulocytes: 0.06 10*3/uL (ref 0.00–0.07)
BASOS ABS: 0.1 10*3/uL (ref 0.0–0.1)
Basophils Relative: 0 %
EOS ABS: 0.2 10*3/uL (ref 0.0–0.5)
Eosinophils Relative: 2 %
HEMATOCRIT: 43.5 % (ref 36.0–46.0)
HEMOGLOBIN: 13.5 g/dL (ref 12.0–15.0)
IMMATURE GRANULOCYTES: 1 %
LYMPHS ABS: 1.3 10*3/uL (ref 0.7–4.0)
LYMPHS PCT: 11 %
MCH: 27.6 pg (ref 26.0–34.0)
MCHC: 31 g/dL (ref 30.0–36.0)
MCV: 88.8 fL (ref 80.0–100.0)
Monocytes Absolute: 0.9 10*3/uL (ref 0.1–1.0)
Monocytes Relative: 8 %
NEUTROS PCT: 78 %
NRBC: 0 % (ref 0.0–0.2)
Neutro Abs: 9.5 10*3/uL — ABNORMAL HIGH (ref 1.7–7.7)
Platelets: 614 10*3/uL — ABNORMAL HIGH (ref 150–400)
RBC: 4.9 MIL/uL (ref 3.87–5.11)
RDW: 14.5 % (ref 11.5–15.5)
WBC: 12.1 10*3/uL — AB (ref 4.0–10.5)

## 2017-12-12 MED ORDER — BUPROPION HCL ER (XL) 150 MG PO TB24
150.0000 mg | ORAL_TABLET | Freq: Every day | ORAL | Status: DC
  Administered 2017-12-14 – 2017-12-17 (×4): 150 mg via ORAL
  Filled 2017-12-12 (×6): qty 1

## 2017-12-12 MED ORDER — SENNOSIDES-DOCUSATE SODIUM 8.6-50 MG PO TABS
1.0000 | ORAL_TABLET | Freq: Two times a day (BID) | ORAL | Status: DC
  Administered 2017-12-12 – 2017-12-17 (×7): 1 via ORAL
  Filled 2017-12-12 (×8): qty 1

## 2017-12-12 MED ORDER — POLYETHYLENE GLYCOL 3350 17 G PO PACK: 17.0000 g | PACK | Freq: Every day | ORAL | Status: DC | PRN

## 2017-12-12 MED ORDER — CHLORHEXIDINE GLUCONATE 4 % EX LIQD: 60.0000 mL | Freq: Once | CUTANEOUS | Status: DC

## 2017-12-12 MED ORDER — TRANEXAMIC ACID-NACL 1000-0.7 MG/100ML-% IV SOLN
1000.0000 mg | INTRAVENOUS | Status: AC
  Administered 2017-12-13: 1000 mg via INTRAVENOUS
  Filled 2017-12-12: qty 100

## 2017-12-12 MED ORDER — SODIUM CHLORIDE 0.45 % IV SOLN
INTRAVENOUS | Status: AC
  Administered 2017-12-12: 22:00:00 via INTRAVENOUS

## 2017-12-12 MED ORDER — MIRTAZAPINE 15 MG PO TABS
15.0000 mg | ORAL_TABLET | Freq: Every day | ORAL | Status: DC
  Administered 2017-12-12 – 2017-12-16 (×4): 15 mg via ORAL
  Filled 2017-12-12 (×4): qty 1

## 2017-12-12 MED ORDER — BISACODYL 10 MG RE SUPP: 10.0000 mg | Freq: Every day | RECTAL | Status: DC | PRN

## 2017-12-12 MED ORDER — ONDANSETRON HCL 4 MG/2ML IJ SOLN: 4.0000 mg | Freq: Four times a day (QID) | INTRAMUSCULAR | Status: DC | PRN

## 2017-12-12 MED ORDER — SIMVASTATIN 20 MG PO TABS
40.0000 mg | ORAL_TABLET | Freq: Every day | ORAL | Status: DC
  Administered 2017-12-14 – 2017-12-15 (×2): 40 mg via ORAL
  Filled 2017-12-12 (×3): qty 2

## 2017-12-12 MED ORDER — CEFAZOLIN SODIUM-DEXTROSE 2-4 GM/100ML-% IV SOLN
2.0000 g | INTRAVENOUS | Status: AC
  Administered 2017-12-13: 2 g via INTRAVENOUS
  Filled 2017-12-12: qty 100

## 2017-12-12 MED ORDER — OLANZAPINE 5 MG PO TABS
5.0000 mg | ORAL_TABLET | Freq: Every day | ORAL | Status: DC
  Administered 2017-12-12 – 2017-12-16 (×4): 5 mg via ORAL
  Filled 2017-12-12 (×5): qty 1

## 2017-12-12 MED ORDER — ADULT MULTIVITAMIN W/MINERALS CH
1.0000 | ORAL_TABLET | Freq: Every day | ORAL | Status: DC
  Administered 2017-12-16 – 2017-12-17 (×2): 1 via ORAL
  Filled 2017-12-12 (×4): qty 1

## 2017-12-12 MED ORDER — POVIDONE-IODINE 10 % EX SWAB
2.0000 "application " | Freq: Once | CUTANEOUS | Status: AC
  Administered 2017-12-12: 2 via TOPICAL

## 2017-12-12 MED ORDER — SERTRALINE HCL 100 MG PO TABS
100.0000 mg | ORAL_TABLET | Freq: Every day | ORAL | Status: DC
  Administered 2017-12-14 – 2017-12-17 (×4): 100 mg via ORAL
  Filled 2017-12-12 (×4): qty 1
  Filled 2017-12-12: qty 2
  Filled 2017-12-12: qty 1

## 2017-12-12 MED ORDER — LACTASE 3000 UNITS PO TABS
9000.0000 [IU] | ORAL_TABLET | Freq: Three times a day (TID) | ORAL | Status: DC
  Administered 2017-12-15 – 2017-12-17 (×2): 9000 [IU] via ORAL
  Filled 2017-12-12 (×14): qty 3

## 2017-12-12 MED ORDER — MORPHINE SULFATE (PF) 2 MG/ML IV SOLN: 0.5000 mg | INTRAVENOUS | Status: DC | PRN

## 2017-12-12 NOTE — ED Notes (Signed)
This RN contacted pt's son, he informed this RN that is mom walks with assist (before hip fracture).  Is normally nonverbal, except for occasional mumbling.  Son informed this RN that the fall nine days ago was from her falling out of bed at the facility.

## 2017-12-12 NOTE — ED Notes (Signed)
Bed: XE94 Expected date:  Expected time:  Means of arrival:  Comments: EMS 47F - L hip fx

## 2017-12-12 NOTE — H&P (Signed)
History and Physical    Deanna [name] WYO:[id number] DOB: 1945/05/21 DOA: 12/12/2017  PCP: Brayton Caves, PA-C   Patient coming from: ALF   Chief Complaint: Limping, x-ray at facility with left hip fracture   HPI: Deanna [name] is a 72 y.o. female with medical history significant for dementia with behavioral disturbance, hyperlipidemia, and recent fall out of bed at her ALF, now presenting to the emergency department for evaluation of limp and x-rays at the facility concerning for left hip fracture.  Patient was seen in the emergency department on 12/09/2017 after she had fallen out of her bed.  She is moving all extremities at that time and had CT head and CT cervical spine without acute pathology.  Since returning to her facility, she has been limping and was evaluated with radiographs there that suggested left hip fracture.  Per report of her son, she had previously been able to ambulate with some assistance, is essentially nonverbal at baseline, and has occasional agitation.  ED Course: Upon arrival to the ED, patient is found to be afebrile, saturating well on room air, and with vitals otherwise stable. EKG features a sinus rhythm and there is no acute disease on CXR. Chemistry panel is unremarkable and CBC notable for leukocytosis to 12,100 and stable chronic thrombocytosis to 614,000. Orthopedic surgery was consulted by ED physician and recommended medical admission with tentative plan for surgical repair on 12/13/17.   Review of Systems:  All other systems reviewed and apart from HPI, are negative.  Past Medical History:  Diagnosis Date  . Dementia ([place]) Dx 2015  . Hyperlipidemia DX 2015  . Hypertension Dx 2015    Past Surgical History:  Procedure Laterality Date  . [place]      reports that she has never smoked. She has never used smokeless tobacco. She reports that she does not drink alcohol or use drugs.  Allergies  Allergen Reactions  . Pork-Derived  Products     Family History  Problem Relation Age of Onset  . Cancer Neg Hx   . Heart disease Neg Hx   . Dementia Neg Hx      Prior to Admission medications   Medication Sig Start Date End Date Taking? Authorizing Provider  buPROPion (WELLBUTRIN XL) 150 MG 24 hr tablet Take 1 tablet (150 mg total) by mouth daily. 10/19/15 12/12/17 Yes Ena Dawley, Tiffany S, PA-C  lactase (LACTAID) 3000 units tablet Take 9,000 Units by mouth 3 (three) times daily before meals.   Yes [provider]  mirtazapine (REMERON) 15 MG tablet Take 15 mg by mouth at bedtime.   Yes [provider]  Multiple Vitamin (THERA) TABS Take 1 tablet by mouth daily.    Yes [provider]  OLANZapine (ZYPREXA) 5 MG tablet Take 1 tablet (5 mg total) by mouth at bedtime. 11/03/15  Yes Funches, Josalyn, MD  polyethylene glycol (MIRALAX / GLYCOLAX) packet Take 17 g by mouth daily as needed (constipation).   Yes [provider]  senna-docusate (SENOKOT-S) 8.6-50 MG tablet Take 1 tablet by mouth 2 (two) times daily.   Yes [provider]  sertraline (ZOLOFT) 100 MG tablet Take 100 mg by mouth daily.   Yes [provider]  simvastatin (ZOCOR) 40 MG tablet Take 40 mg by mouth 2 (two) times daily.   Yes [provider]  feeding supplement, ENSURE ENLIVE, (ENSURE ENLIVE) LIQD Take 237 mLs by mouth 3 (three) times daily between meals. Patient not taking: Reported on  12/12/2017 09/07/17   Hosie Poisson, MD    Physical Exam: Vitals:   12/12/17 1745 12/12/17 1749 12/12/17 1751  BP: (!) 143/75    Pulse: (!) 112  85  Resp: 17    SpO2: 100% 99% 100%    Constitutional: NAD, calm, appears frail   Eyes: PERTLA, lids and conjunctivae normal ENMT: Mucous membranes are moist. Posterior pharynx clear of any exudate or lesions.   Neck: normal, supple, no masses, no thyromegaly Respiratory: clear to auscultation bilaterally, no wheezing, no crackles. Normal respiratory effort.     Cardiovascular: S1 & S2 heard, regular rate and rhythm. No extremity edema. 2+ pedal pulses.  Abdomen: No distension, no tenderness, soft. Bowel sounds normal.  Musculoskeletal: no clubbing / cyanosis. Left hip pain with movement, neurovascularly intact distally.    Skin: no significant rashes, lesions, ulcers. Warm, dry, well-perfused. Neurologic: no facial asymmetry. Patellar DTRs normal. Moving all extremities.  Psychiatric: Sleeping, easily woken. Non-verbal. Calm.     Labs on Admission: I have personally reviewed following labs and imaging studies  CBC: Recent Labs  Lab 12/12/17 1825  WBC 12.1*  NEUTROABS 9.5*  HGB 13.5  HCT 43.5  MCV 88.8  PLT 106*   Basic Metabolic Panel: Recent Labs  Lab 12/12/17 1825  NA 143  K 4.0  CL 101  CO2 29  GLUCOSE 111*  BUN 12  CREATININE 0.79  CALCIUM 9.1   GFR: Estimated Creatinine Clearance: 56.2 mL/min (by C-G formula based on SCr of 0.79 mg/dL). Liver Function Tests: No results for input(s): AST, ALT, ALKPHOS, BILITOT, PROT, ALBUMIN in the last 168 hours. No results for input(s): LIPASE, AMYLASE in the last 168 hours. No results for input(s): AMMONIA in the last 168 hours. Coagulation Profile: No results for input(s): INR, PROTIME in the last 168 hours. Cardiac Enzymes: No results for input(s): CKTOTAL, CKMB, CKMBINDEX, TROPONINI in the last 168 hours. BNP (last 3 results) No results for input(s): PROBNP in the last 8760 hours. HbA1C: No results for input(s): HGBA1C in the last 72 hours. CBG: No results for input(s): GLUCAP in the last 168 hours. Lipid Profile: No results for input(s): CHOL, HDL, LDLCALC, TRIG, CHOLHDL, LDLDIRECT in the last 72 hours. Thyroid Function Tests: No results for input(s): TSH, T4TOTAL, FREET4, T3FREE, THYROIDAB in the last 72 hours. Anemia Panel: No results for input(s): VITAMINB12, FOLATE, FERRITIN, TIBC, IRON, RETICCTPCT in the last 72 hours. Urine analysis:    Component Value Date/Time    COLORURINE AMBER (A) 12/02/2017 1228   APPEARANCEUR HAZY (A) 12/02/2017 1228   APPEARANCEUR Turbid (A) 03/24/2014 1011   LABSPEC 1.030 12/02/2017 1228   PHURINE 5.0 12/02/2017 1228   GLUCOSEU NEGATIVE 12/02/2017 1228   HGBUR NEGATIVE 12/02/2017 1228   BILIRUBINUR NEGATIVE 12/02/2017 1228   BILIRUBINUR negative 06/15/2014 1119   BILIRUBINUR Negative 03/24/2014 [place] 12/02/2017 1228   PROTEINUR NEGATIVE 12/02/2017 1228   UROBILINOGEN 1.0 06/15/2014 1119   NITRITE NEGATIVE 12/02/2017 1228   LEUKOCYTESUR NEGATIVE 12/02/2017 1228   LEUKOCYTESUR Negative 03/24/2014 1011   Sepsis Labs: @LABRCNTIP (procalcitonin:4,lacticidven:4) )No results found for this or any previous visit (from the past 240 hour(s)).   Radiological Exams on Admission: Dg Chest 1 View  Result Date: 12/12/2017 CLINICAL DATA:  Left hip fracture. EXAM: CHEST  1 VIEW COMPARISON:  12/02/2017 FINDINGS: Cardiac silhouette is normal in size. No mediastinal hilar masses. No evidence of adenopathy. Clear lungs. No pleural effusion or pneumothorax noted on this supine exam. Skeletal structures are grossly intact.  IMPRESSION: No active disease. Electronically Signed   By: Lajean Manes M.D.   On: 12/12/2017 19:04   Dg Hip Unilat W Or Wo Pelvis 2-3 Views Left  Result Date: 12/12/2017 CLINICAL DATA:  [place] 9 days ago. Reportedly had a hip fracture seen on x-ray at that time. EXAM: DG HIP (WITH OR WITHOUT PELVIS) 2-3V LEFT COMPARISON:  None. FINDINGS: There is a non comminuted, mildly displaced, fracture of the mid femoral neck. Distal fracture component has displaced superiorly by 1.5 cm. No other fractures. The hip joints, SI joints and symphysis pubis are normally aligned. Bones are diffusely demineralized. There is diffuse soft tissue edema/hemorrhage overlying the hip fracture. IMPRESSION: 1. Displaced, non comminuted, mid left femoral neck fracture. Electronically Signed   By: Lajean Manes M.D.   On: 12/12/2017  19:03    EKG: Independently reviewed. Sinus rhythm.   Assessment/Plan   1. Left hip fracture  - Presents from ALF where she was noted to be limping and found to have left hip fracture on x-ray there  - She was seen in ED after a fall out of bed 2 days ago with no acute findings on CT head and c-spine, was moving all extremities at that time  - Radiographs here confirm left femoral neck fracture  - Orthopedic surgery is consulting and much appreciated  - Based on the available data, Ms. Stolarz presents an estimated 1.2% risk for perioperative MI or cardiac arrest per Melburn Hake al; no additional testing recommended  - Keep NPO after midnight, provide gentle IVF hydration, continue pain-control   2. Dementia with behavioral disturbance; depression with anxiety - She is reportedly non-verbal at baseline and ambulates with assistance  - She is calm on admission  - Continue Zyprexa, Remeron, Zoloft, and Wellbutrin   3. HLD  - Continue Zocor    DVT prophylaxis: SCD's  Code Status: DNR; discussed with son, and okay to suspend her DNR status for surgery  Family Communication: Son updated by phone  Consults called: Orthopedic surgery  Admission status: Inpatient     Vianne Bulls, MD [facility] Pager [phone]  If 7PM-7AM, please contact night-coverage [email or web address] Password [email or web address]  12/12/2017, 8:26 PM

## 2017-12-12 NOTE — ED Triage Notes (Signed)
Per EMS: Pt fell 9 days ago.  X-ray was done today and they saw a left hip fracture.  Pt has dementia and is non verbal.

## 2017-12-12 NOTE — ED Provider Notes (Signed)
[facility] DEPT Provider Note   CSN: [id number] Arrival date & time: 12/12/17  1734     History   Chief Complaint No chief complaint on file.   HPI Deanna [name] is a 72 y.o. female.  72 year old female with prior medical history as detailed below presents for evaluation of a left hip fracture.  Patient reportedly fell several days prior to today's evaluation.  She resides at an assisted living facility and per the chart review is able to walk independently at baseline.  She apparently has been complaining of persistent left hip pain following her fall.  Mobile x-rays were done at the facility today which reveal a left femoral neck fracture. She was then sent to the ED for further workup/evaluation.   Patient with dementia. She is unable to provide significant history today.   Level 5 caveat secondary same.   The history is provided by the patient, medical records, the EMS personnel and the nursing home.  Illness  This is a new problem. The current episode started more than 2 days ago. The problem occurs constantly. The problem has not changed since onset.Pertinent negatives include no chest pain and no abdominal pain. The symptoms are aggravated by walking. Nothing relieves the symptoms.    Past Medical History:  Diagnosis Date  . Dementia ([place]) Dx 2015  . Hyperlipidemia DX 2015  . Hypertension Dx 2015    Patient Active Problem List   Diagnosis Date Noted  . Generalized weakness 12/02/2017  . Dehydration   . Advance care planning   . Goals of care, counseling/discussion   . Malnutrition of moderate degree 09/05/2017  . Altered mental status, unspecified 09/04/2017  . Dementia with behavioral disturbance ([place]) 08/04/2015  . Increased ammonia level 06/15/2014  . Left flank pain 06/15/2014  . Cognitive changes 03/24/2014  . Fatigue 03/24/2014  . Vitreous floaters of right eye 03/11/2014  . Underweight 03/11/2014  . HTN (hypertension)  12/16/2013  . HLD (hyperlipidemia) 12/16/2013  . Dementia ([place]) 12/16/2013    Past Surgical History:  Procedure Laterality Date  . [place]      OB History   None      Home Medications    Prior to Admission medications   Medication Sig Start Date End Date Taking? Authorizing Provider  buPROPion (WELLBUTRIN XL) 150 MG 24 hr tablet Take 1 tablet (150 mg total) by mouth daily. 10/19/15 12/09/17  Brayton Caves, PA-C  feeding supplement, ENSURE ENLIVE, (ENSURE ENLIVE) LIQD Take 237 mLs by mouth 3 (three) times daily between meals. 09/07/17   Hosie Poisson, MD  lactase (LACTAID) 3000 units tablet Take 9,000 Units by mouth 3 (three) times daily before meals.    [provider]  mirtazapine (REMERON) 15 MG tablet Take 15 mg by mouth at bedtime.    [provider]  Multiple Vitamin (THERA) TABS Take 1 tablet by mouth daily.     [provider]  OLANZapine (ZYPREXA) 5 MG tablet Take 1 tablet (5 mg total) by mouth at bedtime. 11/03/15   Funches, Adriana Mccallum, MD  polyethylene glycol (MIRALAX / GLYCOLAX) packet Take 17 g by mouth daily as needed (constipation).    [provider]  senna-docusate (SENOKOT-S) 8.6-50 MG tablet Take 1 tablet by mouth 2 (two) times daily.    [provider]  sertraline (ZOLOFT) 100 MG tablet Take 100 mg by mouth daily.    [provider]  simvastatin (ZOCOR) 40 MG tablet Take 40 mg by  mouth 2 (two) times daily.    [provider]    Family History Family History  Problem Relation Age of Onset  . Cancer Neg Hx   . Heart disease Neg Hx   . Dementia Neg Hx     Social History Social History   Tobacco Use  . Smoking status: Never Smoker  . Smokeless tobacco: Never Used  Substance Use Topics  . Alcohol use: No  . Drug use: No     Allergies   Pork-derived products   Review of Systems Review of Systems  Unable to perform ROS: Dementia  Cardiovascular: Negative for chest pain.   Gastrointestinal: Negative for abdominal pain.     Physical Exam Updated Vital Signs BP (!) 143/75 (BP Location: Left Arm)   Pulse 85   Resp 17   SpO2 100%   Physical Exam  Constitutional: She appears well-developed and well-nourished. No distress.  HENT:  Head: Normocephalic and atraumatic.  Mouth/Throat: Oropharynx is clear and moist.  Eyes: Pupils are equal, round, and reactive to light. Conjunctivae and EOM are normal.  Neck: Normal range of motion. Neck supple.  Cardiovascular: Normal rate, regular rhythm and normal heart sounds.  Pulmonary/Chest: Effort normal and breath sounds normal. No respiratory distress.  Abdominal: Soft. She exhibits no distension. There is no tenderness.  Musculoskeletal: Normal range of motion. She exhibits no edema or deformity.  Neurological: She is alert.  Skin: Skin is warm and dry.  Psychiatric: She has a normal mood and affect.  Nursing note and vitals reviewed.    ED Treatments / Results  Labs (all labs ordered are listed, but only abnormal results are displayed) Labs Reviewed  BASIC METABOLIC PANEL - Abnormal; Notable for the following components:      Result Value   Glucose, Bld 111 (*)    All other components within normal limits  CBC WITH DIFFERENTIAL/PLATELET - Abnormal; Notable for the following components:   WBC 12.1 (*)    Platelets 614 (*)    Neutro Abs 9.5 (*)    All other components within normal limits  BASIC METABOLIC PANEL  TYPE AND SCREEN    EKG EKG Interpretation  Date/Time:  Thursday December 12 2017 18:15:10 EDT Ventricular Rate:  87 PR Interval:    QRS Duration: 84 QT Interval:  364 QTC Calculation: 438 R Axis:   107 Text Interpretation:  Right and left arm electrode reversal, interpretation assumes no reversal Sinus rhythm Right atrial enlargement Probable lateral infarct, age indeterminate Confirmed by Dene Gentry ([id number]) on 12/12/2017 6:28:05 PM   Radiology Dg Chest 1 View  Result Date:  12/12/2017 CLINICAL DATA:  Left hip fracture. EXAM: CHEST  1 VIEW COMPARISON:  12/02/2017 FINDINGS: Cardiac silhouette is normal in size. No mediastinal hilar masses. No evidence of adenopathy. Clear lungs. No pleural effusion or pneumothorax noted on this supine exam. Skeletal structures are grossly intact. IMPRESSION: No active disease. Electronically Signed   By: Lajean Manes M.D.   On: 12/12/2017 19:04   Dg Hip Unilat W Or Wo Pelvis 2-3 Views Left  Result Date: 12/12/2017 CLINICAL DATA:  [place] 9 days ago. Reportedly had a hip fracture seen on x-ray at that time. EXAM: DG HIP (WITH OR WITHOUT PELVIS) 2-3V LEFT COMPARISON:  None. FINDINGS: There is a non comminuted, mildly displaced, fracture of the mid femoral neck. Distal fracture component has displaced superiorly by 1.5 cm. No other fractures. The hip joints, SI joints and symphysis pubis are normally aligned. Bones are diffusely demineralized.  There is diffuse soft tissue edema/hemorrhage overlying the hip fracture. IMPRESSION: 1. Displaced, non comminuted, mid left femoral neck fracture. Electronically Signed   By: Lajean Manes M.D.   On: 12/12/2017 19:03    Procedures Procedures (including critical care time)  Medications Ordered in ED Medications - No data to display   Initial Impression / Assessment and Plan / ED Course  I have reviewed the triage vital signs and the nursing notes.  Pertinent labs & imaging results that were available during my care of the patient were reviewed by me and considered in my medical decision making (see chart for details).    MDM  Screen complete   She is presenting for evaluation of left hip fracture.  Patient with recent fall and resulting left hip fracture.  Dr. Berenice Primas of Ortho is aware of case.  He requested admission to the medicine service and plans to operate tomorrow 2 PM.  Hospitalist service is aware of case and will evaluate for admission.    Final Clinical Impressions(s) / ED  Diagnoses   Final diagnoses:  Closed fracture of left hip, initial encounter [facility])    ED Discharge Orders    None       Valarie Merino, MD 12/12/17 2020

## 2017-12-12 NOTE — Consult Note (Signed)
Reason for Consult: Left femoral neck fracture displaced Referring Physician: Hospitalist  Deanna [name] is an 72 y.o. female.  HPI: The patient is a 72 year old household ambulator who is somewhat noncommunicative and has some level of dementia who was admitted tonight with a femoral neck fracture.  She was admitted to the medical service and we are consulted for management of her left hip fracture.  The patient by report does walk around the house but is not up and ambulating dramatically.  She had a fall may be a week ago and began limping just a couple of days ago and that is why she was brought to the hospital.  Past Medical History:  Diagnosis Date  . Dementia ([place]) Dx 2015  . Hyperlipidemia DX 2015  . Hypertension Dx 2015    Past Surgical History:  Procedure Laterality Date  . CESAREAN SECTION  1975, 1978     Family History  Problem Relation Age of Onset  . Cancer Neg Hx   . Heart disease Neg Hx   . Dementia Neg Hx     Social History:  reports that she has never smoked. She has never used smokeless tobacco. She reports that she does not drink alcohol or use drugs.  Allergies:  Allergies  Allergen Reactions  . Pork-Derived Products     Medications: I have reviewed the patient's current medications.  Results for orders placed or performed during the hospital encounter of 12/12/17 (from the past 48 hour(s))  Basic metabolic panel     Status: Abnormal   Collection Time: 12/12/17  6:25 PM  Result Value Ref Range   Sodium 143 135 - 145 mmol/L   Potassium 4.0 3.5 - 5.1 mmol/L   Chloride 101 98 - 111 mmol/L   CO2 29 22 - 32 mmol/L   Glucose, Bld 111 (H) 70 - 99 mg/dL   BUN 12 8 - 23 mg/dL   Creatinine, Ser 0.79 0.44 - 1.00 mg/dL   Calcium 9.1 8.9 - 10.3 mg/dL   GFR calc non Af Amer >60 >60 mL/min   GFR calc Af Amer >60 >60 mL/min    Comment: (NOTE) The eGFR has been calculated using the CKD EPI equation. This calculation has not been validated in all clinical  situations. eGFR's persistently <60 mL/min signify possible Chronic Kidney Disease.    Anion gap 13 5 - 15    Comment: Performed at [facility], [place] [address] [place], [place] [address]  CBC with Differential     Status: Abnormal   Collection Time: 12/12/17  6:25 PM  Result Value Ref Range   WBC 12.1 (H) 4.0 - 10.5 K/uL   RBC 4.90 3.87 - 5.11 MIL/uL   Hemoglobin 13.5 12.0 - 15.0 g/dL   HCT 43.5 36.0 - 46.0 %   MCV 88.8 80.0 - 100.0 fL   MCH 27.6 26.0 - 34.0 pg   MCHC 31.0 30.0 - 36.0 g/dL   RDW 14.5 11.5 - 15.5 %   Platelets 614 (H) 150 - 400 K/uL   nRBC 0.0 0.0 - 0.2 %   Neutrophils Relative % 78 %   Neutro Abs 9.5 (H) 1.7 - 7.7 K/uL   Lymphocytes Relative 11 %   Lymphs Abs 1.3 0.7 - 4.0 K/uL   Monocytes Relative 8 %   Monocytes Absolute 0.9 0.1 - 1.0 K/uL   Eosinophils Relative 2 %   Eosinophils Absolute 0.2 0.0 - 0.5 K/uL   Basophils Relative 0 %   Basophils Absolute  0.1 0.0 - 0.1 K/uL   Immature Granulocytes 1 %   Abs Immature Granulocytes 0.06 0.00 - 0.07 K/uL    Comment: Performed at [facility], [place] [address], [place], [place] [address]    Dg Chest 1 View  Result Date: 12/12/2017 CLINICAL DATA:  Left hip fracture. EXAM: CHEST  1 VIEW COMPARISON:  12/02/2017 FINDINGS: Cardiac silhouette is normal in size. No mediastinal hilar masses. No evidence of adenopathy. Clear lungs. No pleural effusion or pneumothorax noted on this supine exam. Skeletal structures are grossly intact. IMPRESSION: No active disease. Electronically Signed   By: Lajean Manes M.D.   On: 12/12/2017 19:04   Dg Hip Unilat W Or Wo Pelvis 2-3 Views Left  Result Date: 12/12/2017 CLINICAL DATA:  [place] 9 days ago. Reportedly had a hip fracture seen on x-ray at that time. EXAM: DG HIP (WITH OR WITHOUT PELVIS) 2-3V LEFT COMPARISON:  None. FINDINGS: There is a non comminuted, mildly displaced, fracture of the mid femoral neck. Distal fracture component has displaced  superiorly by 1.5 cm. No other fractures. The hip joints, SI joints and symphysis pubis are normally aligned. Bones are diffusely demineralized. There is diffuse soft tissue edema/hemorrhage overlying the hip fracture. IMPRESSION: 1. Displaced, non comminuted, mid left femoral neck fracture. Electronically Signed   By: Lajean Manes M.D.   On: 12/12/2017 19:03    ROS  ROS: I have reviewed the patient's review of systems thoroughly and there are no positive responses as relates to the HPI. Blood pressure 123/75, pulse 77, temperature 98.1 F (36.7 C), temperature source Oral, resp. rate 14, SpO2 100 %. Physical Exam Well-developed well-nourished patient in no acute distress. Alert and oriented x3 HEENT:within normal limits Cardiac: Regular rate and rhythm Pulmonary: Lungs clear to auscultation Abdomen: Soft and nontender.  Normal active bowel sounds  Musculoskeletal: Left hip: Limited range of motion.  Externally rotated and shortened.  Painful range of motion.  Neurovascular intact distally. Assessment/Plan: 71 year old female who is somewhat demented and who has a difficult time interacting and communicating who had a fall and suffered a displaced femoral neck fracture.  She is a household ambulator.//We had a prolonged discussion with her son about treatment options.  I feel that the best treatment for her would be hemiarthroplasty.  We will plan on proceeding with that tomorrow when operative time is available.  The son is fully aware of the significant risk of the surgery.  He understands the risk of bleeding, infection, dislocation, need for further surgery, and the slight chance of death and and around the time of surgery.  Understanding these risks he does wish to proceed.  Deanna [name] 12/12/2017, 10:10 PM  Cell number [phone]

## 2017-12-12 NOTE — ED Notes (Signed)
ED TO INPATIENT HANDOFF REPORT  Name/Age/Gender Deanna [name] 72 y.o. female  Code Status    Code Status Orders  (From admission, onward)         Start     Ordered   12/12/17 2016  Do not attempt resuscitation (DNR)  Continuous    Question Answer Comment  In the event of cardiac or respiratory ARREST Do not call a "code blue"   In the event of cardiac or respiratory ARREST Do not perform Intubation, CPR, defibrillation or ACLS   In the event of cardiac or respiratory ARREST Use medication by any route, position, wound care, and other measures to relive pain and suffering. May use oxygen, suction and manual treatment of airway obstruction as needed for comfort.      12/12/17 2017        Code Status History    Date Active Date Inactive Code Status Order ID Comments User Context   12/02/2017 1626 12/04/2017 0001 DNR [id number]  Valinda Party, DO ED   09/04/2017 1603 09/07/2017 2105 DNR [id number]  Aline August, MD ED   08/06/2015 1651 08/08/2015 2325 Full Code [id number]  Lacretia Leigh, MD ED   08/03/2015 1529 08/04/2015 2230 Full Code [id number]  Charlesetta Shanks, MD ED    Advance Directive Documentation     Most Recent Value  Type of Advance Directive  Out of facility DNR (pink MOST or yellow form)  Pre-existing out of facility DNR order (yellow form or pink MOST form)  Yellow form placed in chart (order not valid for inpatient use)  "MOST" Form in Place?  -      Home/SNF/Other Skilled nursing facility  Chief Complaint Fall - +L hip fx  Level of Care/Admitting Diagnosis ED Disposition    ED Disposition Condition [place]: [facility] [[id number]]  Level of Care: Med-Surg [16]  Diagnosis: Closed left hip fracture, initial encounter [facility]) [[id number]]  Admitting Physician: Vianne Bulls [[id number]]  Attending Physician: Vianne Bulls [[id number]]  Estimated length of stay: past midnight tomorrow  Certification:: I certify this patient  will need inpatient services for at least 2 midnights  PT Class (Do Not Modify): Inpatient [101]  PT Acc Code (Do Not Modify): Private [1]       Medical History Past Medical History:  Diagnosis Date  . Dementia ([place]) Dx 2015  . Hyperlipidemia DX 2015  . Hypertension Dx 2015    Allergies Allergies  Allergen Reactions  . Pork-Derived Products     IV Location/Drains/Wounds Patient Lines/Drains/Airways Status   Active Line/Drains/Airways    Name:   Placement date:   Placement time:   Site:   Days:   Peripheral IV 12/12/17 Left;Medial Forearm   12/12/17    2122    Forearm   less than 1   External Urinary Catheter   12/02/17    1755    -   10          Labs/Imaging Results for orders placed or performed during the hospital encounter of 12/12/17 (from the past 48 hour(s))  Basic metabolic panel     Status: Abnormal   Collection Time: 12/12/17  6:25 PM  Result Value Ref Range   Sodium 143 135 - 145 mmol/L   Potassium 4.0 3.5 - 5.1 mmol/L   Chloride 101 98 - 111 mmol/L   CO2 29 22 - 32 mmol/L   Glucose, Bld 111 (H) 70 - 99 mg/dL   BUN 12  8 - 23 mg/dL   Creatinine, Ser 0.79 0.44 - 1.00 mg/dL   Calcium 9.1 8.9 - 10.3 mg/dL   GFR calc non Af Amer >60 >60 mL/min   GFR calc Af Amer >60 >60 mL/min    Comment: (NOTE) The eGFR has been calculated using the CKD EPI equation. This calculation has not been validated in all clinical situations. eGFR's persistently <60 mL/min signify possible Chronic Kidney Disease.    Anion gap 13 5 - 15    Comment: Performed at [facility], [place] [address], [place], [place] [address]  CBC with Differential     Status: Abnormal   Collection Time: 12/12/17  6:25 PM  Result Value Ref Range   WBC 12.1 (H) 4.0 - 10.5 K/uL   RBC 4.90 3.87 - 5.11 MIL/uL   Hemoglobin 13.5 12.0 - 15.0 g/dL   HCT 43.5 36.0 - 46.0 %   MCV 88.8 80.0 - 100.0 fL   MCH 27.6 26.0 - 34.0 pg   MCHC 31.0 30.0 - 36.0 g/dL   RDW 14.5 11.5 - 15.5 %    Platelets 614 (H) 150 - 400 K/uL   nRBC 0.0 0.0 - 0.2 %   Neutrophils Relative % 78 %   Neutro Abs 9.5 (H) 1.7 - 7.7 K/uL   Lymphocytes Relative 11 %   Lymphs Abs 1.3 0.7 - 4.0 K/uL   Monocytes Relative 8 %   Monocytes Absolute 0.9 0.1 - 1.0 K/uL   Eosinophils Relative 2 %   Eosinophils Absolute 0.2 0.0 - 0.5 K/uL   Basophils Relative 0 %   Basophils Absolute 0.1 0.0 - 0.1 K/uL   Immature Granulocytes 1 %   Abs Immature Granulocytes 0.06 0.00 - 0.07 K/uL    Comment: Performed at [facility], [place] [address] [place], [place] [address]   Dg Chest 1 View  Result Date: 12/12/2017 CLINICAL DATA:  Left hip fracture. EXAM: CHEST  1 VIEW COMPARISON:  12/02/2017 FINDINGS: Cardiac silhouette is normal in size. No mediastinal hilar masses. No evidence of adenopathy. Clear lungs. No pleural effusion or pneumothorax noted on this supine exam. Skeletal structures are grossly intact. IMPRESSION: No active disease. Electronically Signed   By: Lajean Manes M.D.   On: 12/12/2017 19:04   Dg Hip Unilat W Or Wo Pelvis 2-3 Views Left  Result Date: 12/12/2017 CLINICAL DATA:  [place] 9 days ago. Reportedly had a hip fracture seen on x-ray at that time. EXAM: DG HIP (WITH OR WITHOUT PELVIS) 2-3V LEFT COMPARISON:  None. FINDINGS: There is a non comminuted, mildly displaced, fracture of the mid femoral neck. Distal fracture component has displaced superiorly by 1.5 cm. No other fractures. The hip joints, SI joints and symphysis pubis are normally aligned. Bones are diffusely demineralized. There is diffuse soft tissue edema/hemorrhage overlying the hip fracture. IMPRESSION: 1. Displaced, non comminuted, mid left femoral neck fracture. Electronically Signed   By: Lajean Manes M.D.   On: 12/12/2017 19:03   EKG Interpretation  Date/Time:  Thursday December 12 2017 18:15:10 EDT Ventricular Rate:  87 PR Interval:    QRS Duration: 84 QT Interval:  364 QTC Calculation: 438 R Axis:   107 Text  Interpretation:  Right and left arm electrode reversal, interpretation assumes no reversal Sinus rhythm Right atrial enlargement Probable lateral infarct, age indeterminate Confirmed by Dene Gentry [phone]) on 12/12/2017 6:28:05 PM   Pending Labs Unresulted Labs (From admission, onward)    Start     Ordered  12/13/17 2947  Basic metabolic panel  Tomorrow morning,   R     12/12/17 2017   12/12/17 2017  Type and screen [facility]  Once,   R    Comments:  [place]    12/12/17 2017          Vitals/Pain Today's Vitals   12/12/17 1745 12/12/17 1749 12/12/17 1751  BP: (!) 143/75    Pulse: (!) 112  85  Resp: 17    SpO2: 100% 99% 100%    Isolation Precautions No active isolations  Medications Medications  simvastatin (ZOCOR) tablet 40 mg (has no administration in time range)  buPROPion (WELLBUTRIN XL) 24 hr tablet 150 mg (has no administration in time range)  mirtazapine (REMERON) tablet 15 mg (has no administration in time range)  OLANZapine (ZYPREXA) tablet 5 mg (has no administration in time range)  sertraline (ZOLOFT) tablet 100 mg (has no administration in time range)  lactase (LACTAID) tablet 9,000 Units (has no administration in time range)  polyethylene glycol (MIRALAX / GLYCOLAX) packet 17 g (has no administration in time range)  senna-docusate (Senokot-S) tablet 1 tablet (has no administration in time range)  THERA TABS 1 tablet (has no administration in time range)  morphine 2 MG/ML injection 0.5-1 mg (has no administration in time range)  bisacodyl (DULCOLAX) suppository 10 mg (has no administration in time range)  ondansetron (ZOFRAN) injection 4 mg (has no administration in time range)  0.45 % sodium chloride infusion (has no administration in time range)    Mobility non-ambulatory

## 2017-12-13 ENCOUNTER — Inpatient Hospital Stay (HOSPITAL_COMMUNITY): Payer: Medicare Other | Admitting: Certified Registered"

## 2017-12-13 ENCOUNTER — Other Ambulatory Visit: Payer: Self-pay

## 2017-12-13 ENCOUNTER — Encounter (HOSPITAL_COMMUNITY)
Admission: EM | Disposition: A | Discharge status: Skilled Nursing Facility | Payer: Self-pay | Source: Skilled Nursing Facility | Attending: Family Medicine

## 2017-12-13 ENCOUNTER — Inpatient Hospital Stay (HOSPITAL_COMMUNITY): Payer: Medicare Other

## 2017-12-13 ENCOUNTER — Encounter (HOSPITAL_COMMUNITY): Payer: Self-pay | Admitting: Certified Registered"

## 2017-12-13 HISTORY — PX: HIP ARTHROPLASTY: SHX981

## 2017-12-13 LAB — BASIC METABOLIC PANEL
Anion gap: 9 (ref 5–15)
BUN: 15 mg/dL (ref 8–23)
CALCIUM: 9 mg/dL (ref 8.9–10.3)
CHLORIDE: 106 mmol/L (ref 98–111)
CO2: 30 mmol/L (ref 22–32)
CREATININE: 0.77 mg/dL (ref 0.44–1.00)
GFR calc non Af Amer: >60 mL/min (ref >60)
GLUCOSE: 96 mg/dL (ref 70–99)
Potassium: 3.7 mmol/L (ref 3.5–5.1)
Sodium: 145 mmol/L (ref 135–145)

## 2017-12-13 LAB — ABO/RH: ABO/RH(D): A POS

## 2017-12-13 SURGERY — HEMIARTHROPLASTY, HIP, DIRECT ANTERIOR APPROACH, FOR FRACTURE
Anesthesia: Spinal | Laterality: Left

## 2017-12-13 MED ORDER — APIXABAN 2.5 MG PO TABS
2.5000 mg | ORAL_TABLET | Freq: Two times a day (BID) | ORAL | Status: DC
  Administered 2017-12-14 – 2017-12-17 (×6): 2.5 mg via ORAL
  Filled 2017-12-13 (×7): qty 1

## 2017-12-13 MED ORDER — PROPOFOL 10 MG/ML IV BOLUS
INTRAVENOUS | Status: AC
  Filled 2017-12-13: qty 60

## 2017-12-13 MED ORDER — METHOCARBAMOL 500 MG PO TABS: 500.0000 mg | ORAL_TABLET | Freq: Four times a day (QID) | ORAL | Status: DC | PRN

## 2017-12-13 MED ORDER — ONDANSETRON HCL 4 MG/2ML IJ SOLN: 4.0000 mg | Freq: Four times a day (QID) | INTRAMUSCULAR | Status: DC | PRN

## 2017-12-13 MED ORDER — BUPIVACAINE HCL (PF) 0.5 % IJ SOLN
INTRAMUSCULAR | Status: DC | PRN
  Administered 2017-12-13: 3 mL

## 2017-12-13 MED ORDER — LACTATED RINGERS IV SOLN
INTRAVENOUS | Status: DC
  Administered 2017-12-13 (×2): via INTRAVENOUS

## 2017-12-13 MED ORDER — HYDROCODONE-ACETAMINOPHEN 5-325 MG PO TABS: 1.0000 | ORAL_TABLET | ORAL | Status: DC | PRN

## 2017-12-13 MED ORDER — ALBUMIN HUMAN 5 % IV SOLN
INTRAVENOUS | Status: AC
  Filled 2017-12-13: qty 250

## 2017-12-13 MED ORDER — FERROUS SULFATE 325 (65 FE) MG PO TABS
325.0000 mg | ORAL_TABLET | Freq: Every day | ORAL | Status: DC
  Administered 2017-12-14 – 2017-12-17 (×3): 325 mg via ORAL
  Filled 2017-12-13 (×4): qty 1

## 2017-12-13 MED ORDER — FENTANYL CITRATE (PF) 100 MCG/2ML IJ SOLN
INTRAMUSCULAR | Status: AC
  Filled 2017-12-13: qty 2

## 2017-12-13 MED ORDER — DOCUSATE SODIUM 100 MG PO CAPS
100.0000 mg | ORAL_CAPSULE | Freq: Two times a day (BID) | ORAL | Status: DC
  Administered 2017-12-13 – 2017-12-16 (×2): 100 mg via ORAL
  Filled 2017-12-13 (×4): qty 1

## 2017-12-13 MED ORDER — LIDOCAINE 2% (20 MG/ML) 5 ML SYRINGE
INTRAMUSCULAR | Status: DC | PRN
  Administered 2017-12-13: 40 mg via INTRAVENOUS

## 2017-12-13 MED ORDER — ALUM & MAG HYDROXIDE-SIMETH 200-200-20 MG/5ML PO SUSP: 30.0000 mL | ORAL | Status: DC | PRN

## 2017-12-13 MED ORDER — FENTANYL CITRATE (PF) 100 MCG/2ML IJ SOLN: 25.0000 ug | INTRAMUSCULAR | Status: DC | PRN

## 2017-12-13 MED ORDER — ONDANSETRON HCL 4 MG PO TABS: 4.0000 mg | ORAL_TABLET | Freq: Four times a day (QID) | ORAL | Status: DC | PRN

## 2017-12-13 MED ORDER — SODIUM CHLORIDE 0.45 % IV SOLN
INTRAVENOUS | Status: AC
  Administered 2017-12-13: 12:00:00 via INTRAVENOUS

## 2017-12-13 MED ORDER — METHOCARBAMOL 500 MG IVPB - SIMPLE MED
500.0000 mg | Freq: Four times a day (QID) | INTRAVENOUS | Status: DC | PRN
  Filled 2017-12-13: qty 50

## 2017-12-13 MED ORDER — HYDROCODONE-ACETAMINOPHEN 5-325 MG PO TABS: 1.0000 | ORAL_TABLET | Freq: Four times a day (QID) | ORAL | Status: DC | PRN

## 2017-12-13 MED ORDER — CEFAZOLIN SODIUM-DEXTROSE 2-4 GM/100ML-% IV SOLN
2.0000 g | Freq: Four times a day (QID) | INTRAVENOUS | Status: AC
  Administered 2017-12-13 – 2017-12-14 (×2): 2 g via INTRAVENOUS
  Filled 2017-12-13 (×2): qty 100

## 2017-12-13 MED ORDER — TAMSULOSIN HCL 0.4 MG PO CAPS
0.4000 mg | ORAL_CAPSULE | Freq: Every day | ORAL | Status: DC
  Administered 2017-12-14 – 2017-12-17 (×3): 0.4 mg via ORAL
  Filled 2017-12-13 (×4): qty 1

## 2017-12-13 MED ORDER — PROPOFOL 500 MG/50ML IV EMUL
INTRAVENOUS | Status: DC | PRN
  Administered 2017-12-13: 50 ug/kg/min via INTRAVENOUS

## 2017-12-13 MED ORDER — MEPERIDINE HCL 50 MG/ML IJ SOLN: 6.2500 mg | INTRAMUSCULAR | Status: DC | PRN

## 2017-12-13 MED ORDER — METOCLOPRAMIDE HCL 5 MG/ML IJ SOLN: 10.0000 mg | Freq: Once | INTRAMUSCULAR | Status: DC | PRN

## 2017-12-13 MED ORDER — MIDAZOLAM HCL 2 MG/2ML IJ SOLN
INTRAMUSCULAR | Status: AC
  Filled 2017-12-13: qty 2

## 2017-12-13 MED ORDER — BUPIVACAINE-EPINEPHRINE (PF) 0.5% -1:200000 IJ SOLN
INTRAMUSCULAR | Status: DC | PRN
  Administered 2017-12-13: 30 mL

## 2017-12-13 MED ORDER — BUPIVACAINE LIPOSOME 1.3 % IJ SUSP
INTRAMUSCULAR | Status: DC | PRN
  Administered 2017-12-13: 10 mL

## 2017-12-13 MED ORDER — FENTANYL CITRATE (PF) 100 MCG/2ML IJ SOLN
INTRAMUSCULAR | Status: DC | PRN
  Administered 2017-12-13 (×2): 25 ug via INTRAVENOUS

## 2017-12-13 MED ORDER — SODIUM CHLORIDE 0.9 % IV SOLN
INTRAVENOUS | Status: DC | PRN
  Administered 2017-12-13: 15 ug/min via INTRAVENOUS

## 2017-12-13 MED ORDER — HYDROCODONE-ACETAMINOPHEN 5-325 MG PO TABS: 1.0000 | ORAL_TABLET | Freq: Four times a day (QID) | ORAL | 0 refills | Status: DC | PRN

## 2017-12-13 MED ORDER — PROPOFOL 10 MG/ML IV BOLUS
INTRAVENOUS | Status: DC | PRN
  Administered 2017-12-13 (×2): 20 mg via INTRAVENOUS

## 2017-12-13 MED ORDER — ACETAMINOPHEN 325 MG PO TABS
325.0000 mg | ORAL_TABLET | Freq: Four times a day (QID) | ORAL | Status: DC | PRN
  Administered 2017-12-16: 325 mg via ORAL
  Filled 2017-12-13: qty 1

## 2017-12-13 MED ORDER — ALBUMIN HUMAN 5 % IV SOLN
INTRAVENOUS | Status: DC | PRN
  Administered 2017-12-13: 16:00:00 via INTRAVENOUS

## 2017-12-13 MED ORDER — ACETAMINOPHEN 500 MG PO TABS
500.0000 mg | ORAL_TABLET | Freq: Four times a day (QID) | ORAL | Status: AC
  Administered 2017-12-13 – 2017-12-14 (×3): 500 mg via ORAL
  Filled 2017-12-13 (×5): qty 1

## 2017-12-13 MED ORDER — SODIUM CHLORIDE 0.45 % IV SOLN: INTRAVENOUS | Status: AC

## 2017-12-13 SURGICAL SUPPLY — 52 items
BAG ZIPLOCK 12X15 (MISCELLANEOUS) ×3 IMPLANT
BIT DRILL 2.4X128 (BIT) ×2 IMPLANT
BIT DRILL 2.4X128MM (BIT) ×1
BLADE SAW SAG 73X25 THK (BLADE) ×1
BLADE SAW SGTL 73X25 THK (BLADE) ×2 IMPLANT
BRUSH FEMORAL CANAL (MISCELLANEOUS) IMPLANT
COVER WAND RF STERILE (DRAPES) ×3 IMPLANT
DRAPE INCISE IOBAN 66X45 STRL (DRAPES) ×3 IMPLANT
DRAPE ORTHO SPLIT 77X108 STRL (DRAPES) ×4
DRAPE POUCH INSTRU U-SHP 10X18 (DRAPES) ×3 IMPLANT
DRAPE SHEET LG 3/4 BI-LAMINATE (DRAPES) ×3 IMPLANT
DRAPE SURG ORHT 6 SPLT 77X108 (DRAPES) ×2 IMPLANT
DRSG AQUACEL AG ADV 3.5X10 (GAUZE/BANDAGES/DRESSINGS) ×3 IMPLANT
DRSG MEPILEX BORDER 4X8 (GAUZE/BANDAGES/DRESSINGS) ×3 IMPLANT
DURAPREP 26ML APPLICATOR (WOUND CARE) ×3 IMPLANT
ELECT BLADE TIP CTD 4 INCH (ELECTRODE) ×3 IMPLANT
ELECT REM PT RETURN 15FT ADLT (MISCELLANEOUS) ×3 IMPLANT
EVACUATOR 1/8 PVC DRAIN (DRAIN) IMPLANT
FACESHIELD WRAPAROUND (MASK) ×12 IMPLANT
GLOVE BIO SURGEON STRL SZ8 (GLOVE) ×6 IMPLANT
GLOVE BIOGEL PI IND STRL 8 (GLOVE) ×2 IMPLANT
GLOVE BIOGEL PI INDICATOR 8 (GLOVE) ×4
GLOVE ORTHO TXT STRL SZ7.5 (GLOVE) ×6 IMPLANT
HANDPIECE INTERPULSE COAX TIP (DISPOSABLE)
HEAD FEM UNIPOLAR 46 OD STRL (Hips) ×3 IMPLANT
HOOD PEEL AWAY FLYTE STAYCOOL (MISCELLANEOUS) IMPLANT
IMMOBILIZER KNEE 20 (SOFTGOODS) ×6 IMPLANT
IMMOBILIZER KNEE 20 THIGH 36 (SOFTGOODS) ×1 IMPLANT
MANIFOLD NEPTUNE II (INSTRUMENTS) ×3 IMPLANT
NEEDLE HYPO 22GX1.5 SAFETY (NEEDLE) ×3 IMPLANT
NS IRRIG 1000ML POUR BTL (IV SOLUTION) ×3 IMPLANT
PACK TOTAL JOINT (CUSTOM PROCEDURE TRAY) ×3 IMPLANT
PASSER SUT SWANSON 36MM LOOP (INSTRUMENTS) ×3 IMPLANT
POSITIONER SURGICAL ARM (MISCELLANEOUS) ×3 IMPLANT
PRESSURIZER FEMORAL UNIV (MISCELLANEOUS) IMPLANT
SET HNDPC FAN SPRY TIP SCT (DISPOSABLE) IMPLANT
SPACER DEPUY (Hips) ×3 IMPLANT
STAPLER VISISTAT 35W (STAPLE) IMPLANT
STEM SUMMIT BASIC PRESSFIT SZ4 (Hips) ×3 IMPLANT
SUCTION FRAZIER HANDLE 10FR (MISCELLANEOUS) ×2
SUCTION TUBE FRAZIER 10FR DISP (MISCELLANEOUS) ×1 IMPLANT
SUT ETHIBOND NAB CT1 #1 30IN (SUTURE) ×12 IMPLANT
SUT VIC AB 0 CTX 27 (SUTURE) ×6 IMPLANT
SUT VIC AB 1 CTX 36 (SUTURE) ×10
SUT VIC AB 1 CTX36XBRD ANBCTR (SUTURE) ×5 IMPLANT
SUT VIC AB 2-0 CT1 27 (SUTURE) ×6
SUT VIC AB 2-0 CT1 TAPERPNT 27 (SUTURE) ×3 IMPLANT
SYR CONTROL 10ML LL (SYRINGE) ×3 IMPLANT
TOWEL OR 17X26 10 PK STRL BLUE (TOWEL DISPOSABLE) ×3 IMPLANT
TOWER CARTRIDGE SMART MIX (DISPOSABLE) IMPLANT
TRAY FOLEY MTR SLVR 16FR STAT (SET/KITS/TRAYS/PACK) IMPLANT
WATER STERILE IRR 1000ML POUR (IV SOLUTION) ×3 IMPLANT

## 2017-12-13 NOTE — Anesthesia Procedure Notes (Addendum)
Date/Time: 12/13/2017 2:18 PM Performed by: Cynda Familia, CRNA Pre-anesthesia Checklist: Patient identified, Emergency Drugs available, Suction available, Patient being monitored and Timeout performed Patient Re-evaluated:Patient Re-evaluated prior to induction Oxygen Delivery Method: Simple face mask Placement Confirmation: positive ETCO2 and breath sounds checked- equal and bilateral Dental Injury: Teeth and Oropharynx as per pre-operative assessment  Comments: Sedation for spinal

## 2017-12-13 NOTE — Evaluation (Signed)
SLP Cancellation Note  Patient Details Name: Fermina Mishkin MRN: [id number] DOB: [birth year]   Cancelled treatment:       Reason Eval/Treat Not Completed: Medical issues which prohibited therapy(pt npo for surgery, will continue efforts)   [name] 12/13/2017, 7:16 AM   [name], MS [facility] SLP Acute Rehab Services Pager [phone] Office [phone]

## 2017-12-13 NOTE — Op Note (Signed)
NAMEMARIAMA, Deanna [name] MEDICAL RECORD PY:[id number] ACCOUNT [id number] DATE OF BIRTH:08/28/1945 FACILITY: [facility] LOCATION: [facility] PHYSICIAN:Pink Maye L. Kwynn Schlotter, MD  OPERATIVE REPORT  DATE OF PROCEDURE:  12/13/2017  PREOPERATIVE DIAGNOSIS:  Femoral neck fracture, left.  POSTOPERATIVE DIAGNOSIS:  Femoral neck fracture, left.  PROCEDURE:  Left hemiarthroplasty with a [place] basic size 4 with a -3 neck ball 46 mm.  SURGEON:  Dorna Leitz, MD  ASSISTANTModena Slater   ANESTHESIA:  Spinal.  BRIEF HISTORY:  The patient is a 72 year old female with a long history of significant complaints of left hip pain.  She had fallen about 10 days ago and was seen.  Did not think she had anything wrong with her.  Was having some difficulty walking, and  ultimately was found to have a femoral neck fracture displaced.  She was admitted to Medicine.  We were consulted for management.  She was taken to the operating room for femoral neck fracture fixation with a hemiarthroplasty.  The patient was a  household ambulator and somewhat demented, and we felt that a posterior approach hemiarthroplasty was appropriate.  She was brought to the operating room for this reason.  DESCRIPTION OF PROCEDURE:  The patient brought to the operating room.  After adequate anesthesia was obtained with a spinal anesthetic, the patient was placed supine on the operating table.  She was moved into the right lateral decubitus position.  All  bony prominences were well padded.  Attention was turned to the left hip where an incision was made for a posterior approach to the hip.  Subcutaneous tissue was dissected down to the level of the tensor fascia, which was divided in line with its fibers.   Some dissection was necessary to get up under the tensor, and then we put the Charnley retractor in place.  Retractors were put in place above and below the hip.  The piriformis and short external rotators and capsule were taken down and tagged with   Eth[place] interrupted sutures.  Attention was then turned towards provisional neck cut which was made, followed by the removal of the head.  It was measured to 46, and I felt that a monopolar 46 ball would be appropriate.  We used a ball and a stick 46 to  check, and that seemed to have good fit and fill and good suction seal.  At that point, it was removed.  Acetabulum was irrigated, and all bony remnants were removed.  Attention was then turned to the stem side.  A cookie cutter was used, followed by  lateralizer, followed by a canal finder and an introducer, sequentially rasped up to a level of 4.  It had a great fit.  I was really happy with the fit.  We then took a size 4 implant, put it in place.  Trialed it initially with a 0 ball.  It seemed to  be a bit long and went to the -3 ball.  It seemed a little better.  At that point, it was reduced.  The final -3 ball was opened and placed and the hip was reduced.  We then repaired the posterior capsule and the piriformis to the posterior trochanteric  line through drill holes.  The tensor fascia was closed with #1 Vicryl running, the skin with 0 and 2-0 Vicryl and skin staples.  A sterile compressive dressing was applied.  The patient was taken to recovery and was noted to be in satisfactory  condition.  She was taken in a knee immobilizer, and  we took the stays out of the knee immobilizer.  LN/NUANCE  D:12/13/2017 T:12/13/2017 JOB:[id number]

## 2017-12-13 NOTE — Anesthesia Postprocedure Evaluation (Signed)
Anesthesia Post Note  Patient: Deanna [name]  Procedure(s) Performed: ARTHROPLASTY BIPOLAR HIP (HEMIARTHROPLASTY) (Left )     Patient location during evaluation: PACU Anesthesia Type: Spinal Level of consciousness: awake and alert Pain management: pain level controlled Vital Signs Assessment: post-procedure vital signs reviewed and stable Respiratory status: spontaneous breathing and respiratory function stable Cardiovascular status: blood pressure returned to baseline and stable Postop Assessment: no headache, no backache, spinal receding and no apparent nausea or vomiting Anesthetic complications: no    Last Vitals:  Vitals:   12/13/17 1715 12/13/17 1730  BP: (!) 143/102 137/73  Pulse: 88 63  Resp: 11 10  Temp:    SpO2: 100% 100%    Last Pain:  Vitals:   12/13/17 1715  TempSrc:   PainSc: Asleep                 Montez Hageman

## 2017-12-13 NOTE — Progress Notes (Signed)
[facility] PROGRESS NOTE  Deanna [name] NLZ:[id number] DOB: Jun 11, 1945 DOA: 12/12/2017 PCP: Brayton Caves, PA-C   Narrative: 63 African-American female Recent admission 9/02-40/9 4 metabolic encephalopathy weakness rigidity and minor change in mental status likely secondary to centrally acting meds versus dementia  HTN Known dementia since 2015   Presented to hospital with fall and left femoral neck fracture which was displaced orthopedic consulted on admission  A & Plan Left hip fracture-orthopedics plans surgery 1400 today-defer pain management perioperative anticoagulation, weightbearing status, further outpatient wound management per them Moderate to severe dementia-patient seems to be nonverbal at baseline and only mumbles according to son-continue meds Urinary retention-Foley placed secondary to 400 cc retained-starting Flomax-voiding trial as outpatient-may need outpatient urology follow-up HTN-stable   lovenox  DVT prophylaxis: lovenox  Code Status: F   Family Communication: d/w son in detail   Disposition Plan:  ip    Verlon Au, MD  [facility] Direct contact: [phone] --Via amion app OR  --[email or web address]; password [email or web address]  7PM-7AM contact night coverage as above 12/13/2017, 12:24 PM  LOS: 1 day   Consultants:  ortho  Procedures:  none  Antimicrobials:  no  Interval history/Subjective: Cannot obtain history because of demented state  Objective:  Vitals:  Vitals:   12/12/17 2131 12/12/17 2147  BP: 109/65 123/75  Pulse: 77 77  Resp: 18 14  Temp:  98.1 F (36.7 C)  SpO2: 98% 100%    Exam:  Awake alert pleasant no distress but does not verbalize well EOMI NCAT Chest clear Abdomen soft Did not examine extremities   I have personally reviewed the following:   Labs:  WBC on 10/10 was 12  Chem-7 shows no further abnormalities  Imaging studies:  Personally reviewed x-ray  Medical tests:  no  Test discussed with  performing physician:  No  Decision to obtain old records:  No  Review and summation of old records:  No  Scheduled Meds: . buPROPion  150 mg Oral Daily  . chlorhexidine  60 mL Topical Once  . lactase  9,000 Units Oral TID AC  . mirtazapine  15 mg Oral QHS  . multivitamin with minerals  1 tablet Oral Daily  . OLANZapine  5 mg Oral QHS  . senna-docusate  1 tablet Oral BID  . sertraline  100 mg Oral Daily  . simvastatin  40 mg Oral q1800  . tamsulosin  0.4 mg Oral QPC breakfast   Continuous Infusions: . sodium chloride 75 mL/hr at 12/13/17 1146  .  ceFAZolin (ANCEF) IV    . tranexamic acid      Principal Problem:   Closed left hip fracture, initial encounter ([place]) Active Problems:   Dementia with behavioral disturbance ([place])   LOS: 1 day

## 2017-12-13 NOTE — Progress Notes (Signed)
Pt taken to OR.

## 2017-12-13 NOTE — Progress Notes (Signed)
Patient resides at [place]. She sustained a fracture after falling a few weeks ago. Patient will undergo hemiarthroplasty today. CSW will follow for discharge planning needs.   Kathrin Greathouse, Marlinda Mike, MSW Clinical Social Worker  [phone] 12/13/2017  10:23 AM

## 2017-12-13 NOTE — Anesthesia Preprocedure Evaluation (Signed)
Anesthesia Evaluation  Patient identified by MRN, date of birth, ID band Patient confused    Reviewed: Allergy & Precautions, NPO status , Patient's Chart, lab work & pertinent test results  Airway Mallampati: II  TM Distance: >3 FB Neck ROM: Full    Dental no notable dental hx. (+) Edentulous Upper, Edentulous Lower   Pulmonary neg pulmonary ROS,    Pulmonary exam normal breath sounds clear to auscultation       Cardiovascular hypertension, Pt. on medications negative cardio ROS Normal cardiovascular exam Rhythm:Regular Rate:Normal     Neuro/Psych Dementia negative neurological ROS     GI/Hepatic negative GI ROS, Neg liver ROS,   Endo/Other  negative endocrine ROS  Renal/GU negative Renal ROS  negative genitourinary   Musculoskeletal negative musculoskeletal ROS (+)   Abdominal   Peds negative pediatric ROS (+)  Hematology negative hematology ROS (+)   Anesthesia Other Findings   Reproductive/Obstetrics negative OB ROS                             Anesthesia Physical Anesthesia Plan  ASA: III  Anesthesia Plan: Spinal   Post-op Pain Management:    Induction: Intravenous  PONV Risk Score and Plan: 2  Airway Management Planned: Simple Face Mask  Additional Equipment:   Intra-op Plan:   Post-operative Plan:   Informed Consent: I have reviewed the patients History and Physical, chart, labs and discussed the procedure including the risks, benefits and alternatives for the proposed anesthesia with the patient or authorized representative who has indicated his/her understanding and acceptance.   Dental advisory given  Plan Discussed with: CRNA  Anesthesia Plan Comments:         Anesthesia Quick Evaluation

## 2017-12-13 NOTE — Brief Op Note (Signed)
12/12/2017 - 12/13/2017  4:03 PM  PATIENT:  Deanna [name]  72 y.o. female  PRE-OPERATIVE DIAGNOSIS:  LEFT HIP FRACTURE  POST-OPERATIVE DIAGNOSIS:  LEFT HIP FRACTURE  PROCEDURE:  Procedure(s): ARTHROPLASTY BIPOLAR HIP (HEMIARTHROPLASTY) (Left)  SURGEON:  Surgeon(s) and Role:    Dorna Leitz, MD - Primary  PHYSICIAN ASSISTANT:   ASSISTANTS: bethune   ANESTHESIA:   spinal  EBL:  300 mL   BLOOD ADMINISTERED:none  DRAINS: none   LOCAL MEDICATIONS USED:  MARCAINE    and OTHER experel  SPECIMEN:  No Specimen  DISPOSITION OF SPECIMEN:  N/A  COUNTS:  YES  TOURNIQUET:  * No tourniquets in log *  DICTATION: .Other Dictation: Dictation Number [id number]  PLAN OF CARE: Admit to inpatient   PATIENT DISPOSITION:  PACU - hemodynamically stable.   Delay start of Pharmacological VTE agent (>24hrs) due to surgical blood loss or risk of bleeding: no

## 2017-12-13 NOTE — Transfer of Care (Signed)
Immediate Anesthesia Transfer of Care Note  Patient: Deanna [name]  Procedure(s) Performed: ARTHROPLASTY BIPOLAR HIP (HEMIARTHROPLASTY) (Left )  Patient Location: PACU  Anesthesia Type:Spinal  Level of Consciousness: sedated  Airway & Oxygen Therapy: Patient Spontanous Breathing and Patient connected to face mask oxygen  Post-op Assessment: Report given to RN and Post -op Vital signs reviewed and stable  Post vital signs: Reviewed and stable  Last Vitals:  Vitals Value Taken Time  BP 92/51 12/13/2017  4:23 PM  Temp    Pulse    Resp 12 12/13/2017  4:27 PM  SpO2    Vitals shown include unvalidated device data.  Last Pain:  Vitals:   12/12/17 2147  TempSrc: Oral         Complications: No apparent anesthesia complications

## 2017-12-13 NOTE — Discharge Instructions (Signed)
INSTRUCTIONS AFTER JOINT REPLACEMENT  ° °o Remove items at home which could result in a fall. This includes throw rugs or furniture in walking pathways °o ICE to the affected joint every three hours while awake for 30 minutes at a time, for at least the first 3-5 days, and then as needed for pain and swelling.  Continue to use ice for pain and swelling. You may notice swelling that will progress down to the foot and ankle.  This is normal after surgery.  Elevate your leg when you are not up walking on it.   °o Continue to use the breathing machine you got in the hospital (incentive spirometer) which will help keep your temperature down.  It is common for your temperature to cycle up and down following surgery, especially at night when you are not up moving around and exerting yourself.  The breathing machine keeps your lungs expanded and your temperature down. ° ° °DIET:  As you were doing prior to hospitalization, we recommend a well-balanced diet. ° °DRESSING / WOUND CARE / SHOWERING ° °Keep the surgical dressing until follow up.  The dressing is water proof, so you can shower without any extra covering.  IF THE DRESSING FALLS OFF or the wound gets wet inside, change the dressing with sterile gauze.  Please use good hand washing techniques before changing the dressing.  Do not use any lotions or creams on the incision until instructed by your surgeon.   ° °ACTIVITY ° °o Increase activity slowly as tolerated, but follow the weight bearing instructions below.   °o No driving for 6 weeks or until further direction given by your physician.  You cannot drive while taking narcotics.  °o No lifting or carrying greater than 10 lbs. until further directed by your surgeon. °o Avoid periods of inactivity such as sitting longer than an hour when not asleep. This helps prevent blood clots.  °o You may return to work once you are authorized by your doctor.  ° ° ° °WEIGHT BEARING  ° °Weight bearing as tolerated with assist  device (walker, cane, etc) as directed, use it as long as suggested by your surgeon or therapist, typically at least 4-6 weeks. ° ° °EXERCISES ° °Results after joint replacement surgery are often greatly improved when you follow the exercise, range of motion and muscle strengthening exercises prescribed by your doctor. Safety measures are also important to protect the joint from further injury. Any time any of these exercises cause you to have increased pain or swelling, decrease what you are doing until you are comfortable again and then slowly increase them. If you have problems or questions, call your caregiver or physical therapist for advice.  ° °Rehabilitation is important following a joint replacement. After just a few days of immobilization, the muscles of the leg can become weakened and shrink (atrophy).  These exercises are designed to build up the tone and strength of the thigh and leg muscles and to improve motion. Often times heat used for twenty to thirty minutes before working out will loosen up your tissues and help with improving the range of motion but do not use heat for the first two weeks following surgery (sometimes heat can increase post-operative swelling).  ° °These exercises can be done on a training (exercise) mat, on the floor, on a table or on a bed. Use whatever works the best and is most comfortable for you.    Use music or television while you are exercising so that   the exercises are a pleasant break in your day. This will make your life better with the exercises acting as a break in your routine that you can look forward to.   Perform all exercises about fifteen times, three times per day or as directed.  You should exercise both the operative leg and the other leg as well. ° °Exercises include: °  °• Quad Sets - Tighten up the muscle on the front of the thigh (Quad) and hold for 5-10 seconds.   °• Straight Leg Raises - With your knee straight (if you were given a brace, keep it on),  lift the leg to 60 degrees, hold for 3 seconds, and slowly lower the leg.  Perform this exercise against resistance later as your leg gets stronger.  °• Leg Slides: Lying on your back, slowly slide your foot toward your buttocks, bending your knee up off the floor (only go as far as is comfortable). Then slowly slide your foot back down until your leg is flat on the floor again.  °• Angel Wings: Lying on your back spread your legs to the side as far apart as you can without causing discomfort.  °• Hamstring Strength:  Lying on your back, push your heel against the floor with your leg straight by tightening up the muscles of your buttocks.  Repeat, but this time bend your knee to a comfortable angle, and push your heel against the floor.  You may put a pillow under the heel to make it more comfortable if necessary.  ° °A rehabilitation program following joint replacement surgery can speed recovery and prevent re-injury in the future due to weakened muscles. Contact your doctor or a physical therapist for more information on knee rehabilitation.  ° ° °CONSTIPATION ° °Constipation is defined medically as fewer than three stools per week and severe constipation as less than one stool per week.  Even if you have a regular bowel pattern at home, your normal regimen is likely to be disrupted due to multiple reasons following surgery.  Combination of anesthesia, postoperative narcotics, change in appetite and fluid intake all can affect your bowels.  ° °YOU MUST use at least one of the following options; they are listed in order of increasing strength to get the job done.  They are all available over the counter, and you may need to use some, POSSIBLY even all of these options:   ° °Drink plenty of fluids (prune juice may be helpful) and high fiber foods °Colace 100 mg by mouth twice a day  °Senokot for constipation as directed and as needed Dulcolax (bisacodyl), take with full glass of water  °Miralax (polyethylene glycol)  once or twice a day as needed. ° °If you have tried all these things and are unable to have a bowel movement in the first 3-4 days after surgery call either your surgeon or your primary doctor.   ° °If you experience loose stools or diarrhea, hold the medications until you stool forms back up.  If your symptoms do not get better within 1 week or if they get worse, check with your doctor.  If you experience "the worst abdominal pain ever" or develop nausea or vomiting, please contact the office immediately for further recommendations for treatment. ° ° °ITCHING:  If you experience itching with your medications, try taking only a single pain pill, or even half a pain pill at a time.  You can also use Benadryl over the counter for itching or also to   help with sleep.  ° °TED HOSE STOCKINGS:  Use stockings on both legs until for at least 2 weeks or as directed by physician office. They may be removed at night for sleeping. ° °MEDICATIONS:  See your medication summary on the “After Visit Summary” that nursing will review with you.  You may have some home medications which will be placed on hold until you complete the course of blood thinner medication.  It is important for you to complete the blood thinner medication as prescribed. ° °PRECAUTIONS:  If you experience chest pain or shortness of breath - call 911 immediately for transfer to the hospital emergency department.  ° °If you develop a fever greater that 101 F, purulent drainage from wound, increased redness or drainage from wound, foul odor from the wound/dressing, or calf pain - CONTACT YOUR SURGEON.   °                                                °FOLLOW-UP APPOINTMENTS:  If you do not already have a post-op appointment, please call the office for an appointment to be seen by your surgeon.  Guidelines for how soon to be seen are listed in your “After Visit Summary”, but are typically between 1-4 weeks after surgery. ° °OTHER INSTRUCTIONS:  ° °Knee  Replacement:  Do not place pillow under knee, focus on keeping the knee straight while resting. CPM instructions: 0-90 degrees, 2 hours in the morning, 2 hours in the afternoon, and 2 hours in the evening. Place foam block, curve side up under heel at all times except when in CPM or when walking.  DO NOT modify, tear, cut, or change the foam block in any way. ° °MAKE SURE YOU:  °• Understand these instructions.  °• Get help right away if you are not doing well or get worse.  ° ° °Thank you for letting us be a part of your medical care team.  It is a privilege we respect greatly.  We hope these instructions will help you stay on track for a fast and full recovery!  ° ° °Information on my medicine - ELIQUIS® (apixaban) ° °This medication education was reviewed with me or my healthcare representative as part of my discharge preparation.  The pharmacist that spoke with me during my hospital stay was:   ° °Why was Eliquis® prescribed for you? °Eliquis® was prescribed for you to reduce the risk of blood clots forming after orthopedic surgery.   ° °What do You need to know about Eliquis®? °Take your Eliquis® TWICE DAILY - one tablet in the morning and one tablet in the evening with or without food.  It would be best to take the dose about the same time each day. ° °If you have difficulty swallowing the tablet whole please discuss with your pharmacist how to take the medication safely. ° °Take Eliquis® exactly as prescribed by your doctor and DO NOT stop taking Eliquis® without talking to the doctor who prescribed the medication.  Stopping without other medication to take the place of Eliquis® may increase your risk of developing a clot. ° °After discharge, you should have regular check-up appointments with your healthcare provider that is prescribing your Eliquis®. ° °What do you do if you miss a dose? °If a dose of ELIQUIS® is not taken at the scheduled time, take it as   soon as possible on the same day and twice-daily  administration should be resumed.  The dose should not be doubled to make up for a missed dose.  Do not take more than one tablet of ELIQUIS at the same time. ° °Important Safety Information °A possible side effect of Eliquis® is bleeding. You should call your healthcare provider right away if you experience any of the following: °? Bleeding from an injury or your nose that does not stop. °? Unusual colored urine (red or dark brown) or unusual colored stools (red or black). °? Unusual bruising for unknown reasons. °? A serious fall or if you hit your head (even if there is no bleeding). ° °Some medicines may interact with Eliquis® and might increase your risk of bleeding or clotting while on Eliquis®. To help avoid this, consult your healthcare provider or pharmacist prior to using any new prescription or non-prescription medications, including herbals, vitamins, non-steroidal anti-inflammatory drugs (NSAIDs) and supplements. ° °This website has more information on Eliquis® (apixaban): http://www.eliquis.com/eliquis/home ° °

## 2017-12-13 NOTE — Progress Notes (Addendum)
Initial Nutrition Assessment  DOCUMENTATION CODES:   Non-severe (moderate) malnutrition in context of chronic illness  INTERVENTION:   Diet advancement per MD Once diet advanced, provide Ensure Enlive po BID, each supplement provides 350 kcal and 20 grams of protein  NUTRITION DIAGNOSIS:   Moderate Malnutrition related to chronic illness(dementia) as evidenced by mild fat depletion, mild muscle depletion.  GOAL:   Patient will meet greater than or equal to 90% of their needs  MONITOR:   PO intake, Supplement acceptance, Labs, Weight trends, I & O's  REASON FOR ASSESSMENT:   Consult Hip fracture protocol  ASSESSMENT:   72 y.o. female with medical history significant for dementia with behavioral disturbance, hyperlipidemia, and recent fall out of bed at her ALF, now presenting to the emergency department for evaluation of limp and x-rays at the facility concerning for left hip fracture.   Patient awaiting hip surgery. Pt fell ~1 week ago. SLP planning to evaluate post-op. Pt with dementia and unable to provide history. Pt was seen by nutrition team at [facility], pt was ordered Ensure supplements, will order for this admission once diet is advanced. Per weight records, pt with no weight loss.  Labs reviewed. Medications: Remeron tablet daily, Multivitamin with minerals daily, Senokot-S tablet BID  NUTRITION - FOCUSED PHYSICAL EXAM:    Most Recent Value  Orbital Region  Moderate depletion  Upper Arm Region  Mild depletion  Thoracic and Lumbar Region  Unable to assess  Buccal Region  Moderate depletion  Temple Region  Moderate depletion  Clavicle Bone Region  Moderate depletion  Clavicle and Acromion Bone Region  Moderate depletion  Scapular Bone Region  Moderate depletion  Dorsal Hand  Moderate depletion  Patellar Region  Mild depletion  Anterior Thigh Region  Mild depletion  Posterior Calf Region  Mild depletion  Edema (RD Assessment)  None       Diet Order:   Diet  Order            Diet NPO time specified Except for: Sips with Meds  Diet effective midnight              EDUCATION NEEDS:   Not appropriate for education at this time  Skin:  Skin Assessment: Reviewed RN Assessment  Last BM:  10/10  Height:   Ht Readings from Last 1 Encounters:  12/02/17 5\' 6"  (1.676 m)    Weight:   Wt Readings from Last 1 Encounters:  12/03/17 54.4 kg    Ideal Body Weight:  59.1 kg  BMI: 19.4 kg/m^2  Estimated Nutritional Needs:   Kcal:  1550-1750  Protein:  70-80g  Fluid:  1.7L/day   Clayton Bibles, MS, RD, LDN [place] Dietitian Pager: [phone] After Hours Pager: [phone]

## 2017-12-13 NOTE — Plan of Care (Signed)
Plan of care discussed when consent for surgery obtained from [facility]. Patient with Dementia and minimal verbalization.

## 2017-12-13 NOTE — Anesthesia Procedure Notes (Signed)
Spinal  Patient location during procedure: OR End time: 12/13/2017 2:37 PM Staffing Resident/CRNA: Cynda Familia, CRNA Performed: resident/CRNA  Preanesthetic Checklist Completed: patient identified, site marked, surgical consent, pre-op evaluation, timeout performed, IV checked, risks and benefits discussed and monitors and equipment checked Spinal Block Patient position: right lateral decubitus Prep: site prepped and draped and DuraPrep Patient monitoring: heart rate, continuous pulse ox, blood pressure and cardiac monitor Approach: midline Location: L2-3 Injection technique: single-shot Needle Needle type: Sprotte and Quincke  Needle gauge: 22 G Assessment Sensory level: T4 Additional Notes Expiration date of tray noted and within date.   Patient tolerated procedure well.  CRNA attempted -- no CSF flow-- [place] placed paramedian-- pt sedated tot well-- prep dry at time of injection

## 2017-12-13 NOTE — Plan of Care (Signed)
Pt alert, disoriented, dementia at baseline.  Pt taken to OR for surgery with Dr. Berenice Primas. Son at the bedside. RN will monitor.

## 2017-12-14 LAB — CBC WITH DIFFERENTIAL/PLATELET
Abs Immature Granulocytes: 0.03 10*3/uL (ref 0.00–0.07)
BASOS PCT: 0 %
Basophils Absolute: 0 10*3/uL (ref 0.0–0.1)
EOS ABS: 0.1 10*3/uL (ref 0.0–0.5)
EOS PCT: 1 %
HEMATOCRIT: 37.7 % (ref 36.0–46.0)
Hemoglobin: 11.6 g/dL — ABNORMAL LOW (ref 12.0–15.0)
Immature Granulocytes: 0 %
LYMPHS ABS: 0.9 10*3/uL (ref 0.7–4.0)
Lymphocytes Relative: 8 %
MCH: 27.2 pg (ref 26.0–34.0)
MCHC: 30.8 g/dL (ref 30.0–36.0)
MCV: 88.5 fL (ref 80.0–100.0)
Monocytes Absolute: 0.9 10*3/uL (ref 0.1–1.0)
Monocytes Relative: 9 %
Neutro Abs: 8.6 10*3/uL — ABNORMAL HIGH (ref 1.7–7.7)
Neutrophils Relative %: 82 %
Platelets: 656 10*3/uL — ABNORMAL HIGH (ref 150–400)
RBC: 4.26 MIL/uL (ref 3.87–5.11)
RDW: 13.8 % (ref 11.5–15.5)
WBC: 10.6 10*3/uL — ABNORMAL HIGH (ref 4.0–10.5)
nRBC: 0 % (ref 0.0–0.2)

## 2017-12-14 LAB — COMPREHENSIVE METABOLIC PANEL
ALBUMIN: 3.5 g/dL (ref 3.5–5.0)
ALT: 16 U/L (ref 0–44)
ANION GAP: 10 (ref 5–15)
AST: 27 U/L (ref 15–41)
Alkaline Phosphatase: 57 U/L (ref 38–126)
BUN: 10 mg/dL (ref 8–23)
CHLORIDE: 103 mmol/L (ref 98–111)
CO2: 28 mmol/L (ref 22–32)
Calcium: 8.4 mg/dL — ABNORMAL LOW (ref 8.9–10.3)
Creatinine, Ser: 0.77 mg/dL (ref 0.44–1.00)
GFR calc Af Amer: >60 mL/min (ref >60)
GFR calc non Af Amer: >60 mL/min (ref >60)
GLUCOSE: 117 mg/dL — AB (ref 70–99)
POTASSIUM: 4.1 mmol/L (ref 3.5–5.1)
SODIUM: 141 mmol/L (ref 135–145)
Total Bilirubin: 1 mg/dL (ref 0.3–1.2)
Total Protein: 6.2 g/dL — ABNORMAL LOW (ref 6.5–8.1)

## 2017-12-14 MED ORDER — SODIUM CHLORIDE 0.45 % IV SOLN
INTRAVENOUS | Status: AC
  Administered 2017-12-14: 10:00:00 via INTRAVENOUS

## 2017-12-14 NOTE — Evaluation (Signed)
Physical Therapy Evaluation Patient Details Name: Deanna [name] MRN: [id number] DOB: [birth year] Today's Date: 12/14/2017   History of Present Illness  Pt is a 72 y.o. female with medical history significant of advanced dementia, HTN, and HLD who presents from [place] and had a fall, suffering a displaced femoral neck fracture and s/p left hip hemiarthroplasty on 12/13/17  Clinical Impression  Patient is s/p above surgery resulting in functional limitations due to the deficits listed below (see PT Problem List).  Patient will benefit from skilled PT to increase their independence and safety with mobility to allow discharge to the venue listed below.  Pt with advanced dementia and now has posterior hip precautions.  Pt would benefit from SNF prior to return to ALF.  Pt currently max to total assist for mobility at this time.  Pt requires multimodal cues (which she does not consistently follow) and will attempt to assist with mobility once manually initiated.     Follow Up Recommendations SNF    Equipment Recommendations  None recommended by PT    Recommendations for Other Services       Precautions / Restrictions Precautions Precautions: Fall;Posterior Hip Precaution Comments: left posterior precautions handout in room for family and staff, pt with dementia  Required Braces or Orthoses: Knee Immobilizer - Left Restrictions LLE Weight Bearing: Weight bearing as tolerated      Mobility  Bed Mobility Overal bed mobility: Needs Assistance Bed Mobility: Supine to Sit;Sit to Supine     Supine to sit: +2 for physical assistance;+2 for safety/equipment;Total assist Sit to supine: Total assist;+2 for physical assistance;+2 for safety/equipment   General bed mobility comments: multimodal cues for technique; pt did not initiate, required total assist, utilized bed pad  Transfers Overall transfer level: Needs assistance Equipment used: 2 person hand held assist Transfers: Sit  to/from Stand Sit to Stand: Max assist         General transfer comment: pt provided with HHA to start due to dementia however pt reached for RW once standing initiated, assist to rise, steady and control descent, pt with feet kept forward, unable to correct with cues so assisted back to sitting  Ambulation/Gait                Stairs            Wheelchair Mobility    Modified Rankin (Stroke Patients Only)       Balance Overall balance assessment: Needs assistance Sitting-balance support: Feet unsupported;No upper extremity supported Sitting balance-Leahy Scale: Fair Sitting balance - Comments: once pt assisted upright she can maintain upright posture, tends to keep her hands in her lap Postural control: Posterior lean Standing balance support: Bilateral upper extremity supported;During functional activity Standing balance-Leahy Scale: Zero Standing balance comment: unable to follow provided multimodal cues for better BOS upon standing so required assist                             Pertinent Vitals/Pain Pain Assessment: Faces Faces Pain Scale: Hurts even more Pain Location: L leg Pain Descriptors / Indicators: Moaning;Guarding;Grimacing Pain Intervention(s): Limited activity within patient's tolerance;Monitored during session;Repositioned    Home Living Family/patient expects to be discharged to:: Skilled nursing facility                 Additional Comments: pt from [facility]    Prior Function Level of Independence: Needs assistance   Gait / Transfers Assistance Needed: per  chart review, son reports pt was ambulatory and feeding herself at baseline  ADL's / Homemaking Assistance Needed: No family present to assist w/ PLOF, pt unable.        Hand Dominance        Extremity/Trunk Assessment   Upper Extremity Assessment Upper Extremity Assessment: Generalized weakness;Difficult to assess due to impaired cognition     Lower Extremity Assessment Lower Extremity Assessment: Generalized weakness;LLE deficits/detail;Difficult to assess due to impaired cognition LLE Deficits / Details: L LE in [place] so maintained KI, pt with increased bil hip adduction and rotation tightness/tone       Communication   Communication: Other (comment)(no verbalizations today, only mumbles)  Cognition Arousal/Alertness: Awake/alert Behavior During Therapy: Flat affect(Keeps eyes closed for a good portion of PT assessment. Opens briefly with encouragement ) Overall Cognitive Status: History of cognitive impairments - at baseline(No family present. H/o advanced dementia at baseline)                                        General Comments      Exercises     Assessment/Plan    PT Assessment Patient needs continued PT services  PT Problem List Decreased strength;Decreased range of motion;Decreased activity tolerance;Decreased balance;Decreased mobility;Decreased coordination;Decreased cognition;Decreased knowledge of use of DME;Decreased safety awareness;Decreased knowledge of precautions;Pain       PT Treatment Interventions DME instruction;Gait training;Functional mobility training;Therapeutic activities;Therapeutic exercise;Balance training;Cognitive remediation;Patient/family education;Wheelchair mobility training    PT Goals (Current goals can be found in the Care Plan section)  Acute Rehab PT Goals PT Goal Formulation: Patient unable to participate in goal setting Time For Goal Achievement: 12/28/17 Potential to Achieve Goals: Fair    Frequency Min 2X/week   Barriers to discharge        Co-evaluation               AM-PAC PT "6 Clicks" Daily Activity  Outcome Measure Difficulty turning over in bed (including adjusting bedclothes, sheets and blankets)?: Unable Difficulty moving from lying on back to sitting on the side of the bed? : Unable Difficulty sitting down on and standing up from  a chair with arms (e.g., wheelchair, bedside commode, etc,.)?: Unable Help needed moving to and from a bed to chair (including a wheelchair)?: Total Help needed walking in hospital room?: Total Help needed climbing 3-5 steps with a railing? : Total 6 Click Score: 6    End of Session Equipment Utilized During Treatment: Gait belt;Left knee immobilizer Activity Tolerance: Patient tolerated treatment well Patient left: with call bell/phone within reach;in bed;with bed alarm set Nurse Communication: Mobility status PT Visit Diagnosis: Other abnormalities of gait and mobility (R26.89)    Time: 1444-1500 PT Time Calculation (min) (ACUTE ONLY): 16 min   Charges:   PT Evaluation $PT Eval Low Complexity: [place], PT, DPT Acute Rehabilitation Services Office: [phone] Pager: [phone]  Trena Platt 12/14/2017, 3:30 PM

## 2017-12-14 NOTE — Progress Notes (Signed)
[facility] PROGRESS NOTE  Deanna [name] DJM:[id number] DOB: 1945-12-05 DOA: 12/12/2017 PCP: Brayton Caves, PA-C   Narrative: 57 African-American female Recent admission 2/22-97/9 4 metabolic encephalopathy weakness rigidity and minor change in mental status likely secondary to centrally acting meds versus dementia  HTN Known dementia since 2015   Presented to hospital with fall and left femoral neck fracture which was displaced orthopedic consulted on admission  A & Plan  Left hip fracture-orthopedics plans surgery 1400 today-defer pain management perioperative anticoagulation, weightbearing status, further outpatient wound management per them--no O2 requirement post-op--AC per Ortho is eliquis 2.5 bid  Cont IV saline today  Moderate to severe dementia-patient seems to be nonverbal at baseline and only mumbles according to son-continue meds  Urinary retention-Foley placed 10/11-unsing Flomax-voiding trial, RN to alert me if not able to voidt-may need outpatient urology follow-up?  HTN-stable   lovenox  DVT prophylaxis: lovenox  Code Status: F   Family Communication: no family present today Disposition Plan:  ip    Verlon Au, MD  [facility] Direct contact: [phone] --Via amion app OR  --[email or web address]; password [email or web address]  7PM-7AM contact night coverage as above 12/14/2017, 10:17 AM  LOS: 2 days   Consultants:  ortho  Procedures:  none  Antimicrobials:  no  Interval history/Subjective: Cannot obtain history because of demented state Nursing reports only taking sips of fluiids  Objective:  Vitals:  Vitals:   12/14/17 0601 12/14/17 0848  BP: (!) 179/92   Pulse: (!) 111 94  Resp: 14   Temp: (!) 97.3 F (36.3 C)   SpO2:  99%    Exam:  Awake  But non verbal and not very interactive EOMI NCAT Chest clear, no added sound Abdomen soft LLE bandage and immobilizer on L knee   I have personally reviewed the following:   Labs:  WBC on  10/10 was 12-->10.6  Hemoglobin 13.5-->11.6  Chem-7 shows no further abnormalities  Imaging studies:   Medical tests:  no  Test discussed with performing physician:  No  Decision to obtain old records:  No  Review and summation of old records:  No  Scheduled Meds: . acetaminophen  500 mg Oral Q6H  . apixaban  2.5 mg Oral BID  . buPROPion  150 mg Oral Daily  . docusate sodium  100 mg Oral BID  . ferrous sulfate  325 mg Oral Q breakfast  . lactase  9,000 Units Oral TID AC  . mirtazapine  15 mg Oral QHS  . multivitamin with minerals  1 tablet Oral Daily  . OLANZapine  5 mg Oral QHS  . senna-docusate  1 tablet Oral BID  . sertraline  100 mg Oral Daily  . simvastatin  40 mg Oral q1800  . tamsulosin  0.4 mg Oral QPC breakfast   Continuous Infusions: . sodium chloride      Principal Problem:   Closed left hip fracture, initial encounter (HCC) Active Problems:   Dementia with behavioral disturbance (HCC)   LOS: 2 days

## 2017-12-14 NOTE — Evaluation (Addendum)
Clinical/Bedside Swallow Evaluation Patient Details  Name: Deanna [name] MRN: [id number] Date of Birth: [birth year]  Today's Date: 12/14/2017 Time: SLP Start Time (ACUTE ONLY): 49 SLP Stop Time (ACUTE ONLY): 1427 SLP Time Calculation (min) (ACUTE ONLY): 12 min  Past Medical History:  Past Medical History:  Diagnosis Date  . Dementia ([place]) Dx 2015  . Hyperlipidemia DX 2015  . Hypertension Dx 2015   Past Surgical History:  Past Surgical History:  Procedure Laterality Date  . [place]    HPI:  72 y.o. female with medical history significant for dementia with behavioral disturbance, hyperlipidemia, and recent fall out of bed at her ALF, presented with fall and left femoral neck fracture, s/p Left hemiarthroplasty.   Assessment / Plan / Recommendation Clinical Impression   Patient presents with what appears to be cognitively-based dysphagia. She is lethargic, but awakens with cues. RN reports pt was noted to be pocketing pills. She requires total assist for feeding today; good oral acceptance for small bites of puree, mild oral holding, requires mod cues for retrieval of thin liquids via straw. No overt signs of aspiration. Pt did not make attempts to self feed or accept regular solids even with max assist. Recommend dys 1, thin liquids, meds crushed in puree. Full supervision/assistance for feeding. Will follow for advancement pending improvements in mentation, arousal.    SLP Visit Diagnosis: Dysphagia, unspecified (R13.10)    Aspiration Risk  Moderate aspiration risk    Diet Recommendation Dysphagia 1 (Puree);Thin liquid   Liquid Administration via: Cup;Straw Medication Administration: Crushed with puree Supervision: Full supervision/cueing for compensatory strategies;Staff to assist with self feeding Compensations: Slow rate;Small sips/bites;Minimize environmental distractions Postural Changes: Seated upright at 90 degrees    Other  Recommendations Oral  Care Recommendations: Oral care BID   Follow up Recommendations Skilled Nursing facility      Frequency and Duration min 2x/week  2 weeks       Prognosis Prognosis for Safe Diet Advancement: Good Barriers to Reach Goals: Cognitive deficits      Swallow Study   General Date of Onset: 12/12/17 HPI: 72 y.o. female with medical history significant for dementia with behavioral disturbance, hyperlipidemia, and recent fall out of bed at her ALF, presented with fall and left femoral neck fracture, s/p Left hemiarthroplasty. Type of Study: Bedside Swallow Evaluation Previous Swallow Assessment: Bedside swallow assessment 09/05/17 recommended dys 3/thin liquids, no ST follow-up Diet Prior to this Study: Regular;Thin liquids Temperature Spikes Noted: Yes(100.6) Respiratory Status: Room air History of Recent Intubation: No Behavior/Cognition: Lethargic/Drowsy;Requires cueing;Confused Oral Cavity Assessment: Within Functional Limits Oral Care Completed by SLP: No Oral Cavity - Dentition: Missing dentition Vision: Impaired for self-feeding Self-Feeding Abilities: Total assist Patient Positioning: Upright in bed Baseline Vocal Quality: Low vocal intensity Volitional Cough: Cognitively unable to elicit Volitional Swallow: Unable to elicit    Oral/Motor/Sensory Function Overall Oral Motor/Sensory Function: Generalized oral weakness   Ice Chips Ice chips: Within functional limits Presentation: Spoon   Thin Liquid Thin Liquid: Within functional limits Presentation: Straw    Nectar Thick Nectar Thick Liquid: Not tested   Honey Thick Honey Thick Liquid: Not tested   Puree Puree: Impaired Presentation: Spoon Oral Phase Impairments: Poor awareness of bolus Oral Phase Functional Implications: Oral holding;Prolonged oral transit   Solid     Solid: Not tested     Deneise Lever, MS, [place] Pager: [phone] Office:  [phone]  Aliene Altes 12/14/2017,2:30 PM

## 2017-12-15 LAB — CBC WITH DIFFERENTIAL/PLATELET
Abs Immature Granulocytes: 0.1 10*3/uL — ABNORMAL HIGH (ref 0.00–0.07)
Basophils Absolute: 0 10*3/uL (ref 0.0–0.1)
Basophils Relative: 0 %
EOS PCT: 2 %
Eosinophils Absolute: 0.2 10*3/uL (ref 0.0–0.5)
HEMATOCRIT: 33.8 % — AB (ref 36.0–46.0)
Hemoglobin: 10.5 g/dL — ABNORMAL LOW (ref 12.0–15.0)
Immature Granulocytes: 1 %
LYMPHS ABS: 1.4 10*3/uL (ref 0.7–4.0)
LYMPHS PCT: 13 %
MCH: 27.2 pg (ref 26.0–34.0)
MCHC: 31.1 g/dL (ref 30.0–36.0)
MCV: 87.6 fL (ref 80.0–100.0)
MONO ABS: 0.9 10*3/uL (ref 0.1–1.0)
Monocytes Relative: 9 %
Neutro Abs: 7.6 10*3/uL (ref 1.7–7.7)
Neutrophils Relative %: 75 %
Platelets: 641 10*3/uL — ABNORMAL HIGH (ref 150–400)
RBC: 3.86 MIL/uL — ABNORMAL LOW (ref 3.87–5.11)
RDW: 13.9 % (ref 11.5–15.5)
WBC: 10.1 10*3/uL (ref 4.0–10.5)
nRBC: 0 % (ref 0.0–0.2)

## 2017-12-15 LAB — COMPREHENSIVE METABOLIC PANEL
ALT: 13 U/L (ref 0–44)
AST: 22 U/L (ref 15–41)
Albumin: 2.8 g/dL — ABNORMAL LOW (ref 3.5–5.0)
Alkaline Phosphatase: 49 U/L (ref 38–126)
Anion gap: 7 (ref 5–15)
BILIRUBIN TOTAL: 1.1 mg/dL (ref 0.3–1.2)
BUN: 8 mg/dL (ref 8–23)
CHLORIDE: 105 mmol/L (ref 98–111)
CO2: 30 mmol/L (ref 22–32)
CREATININE: 0.62 mg/dL (ref 0.44–1.00)
Calcium: 8.1 mg/dL — ABNORMAL LOW (ref 8.9–10.3)
GLUCOSE: 114 mg/dL — AB (ref 70–99)
Potassium: 3.2 mmol/L — ABNORMAL LOW (ref 3.5–5.1)
Sodium: 142 mmol/L (ref 135–145)
Total Protein: 5.3 g/dL — ABNORMAL LOW (ref 6.5–8.1)

## 2017-12-15 MED ORDER — POTASSIUM CHLORIDE CRYS ER 20 MEQ PO TBCR
40.0000 meq | EXTENDED_RELEASE_TABLET | Freq: Every day | ORAL | Status: DC
  Administered 2017-12-16 – 2017-12-17 (×2): 40 meq via ORAL
  Filled 2017-12-15 (×3): qty 2

## 2017-12-15 MED ORDER — ENSURE ENLIVE PO LIQD
237.0000 mL | Freq: Two times a day (BID) | ORAL | Status: DC
  Administered 2017-12-16: 237 mL via ORAL

## 2017-12-15 NOTE — Progress Notes (Signed)
Floor coverage text paged RE: pt output and episode of desaturation.

## 2017-12-15 NOTE — Progress Notes (Signed)
Brief Pharmacy note: unable to educate patient on Apixaban for VTE prophylaxis after ortho surgery.  No family in attendance since patient admission.  Thank you,   Minda Ditto PharmD Pager [phone] 12/15/2017, 1:14 PM

## 2017-12-15 NOTE — Progress Notes (Signed)
[facility] PROGRESS NOTE  Deanna [name] EXN:[id number] DOB: 1945/05/23 DOA: 12/12/2017 PCP: Brayton Caves, PA-C   Narrative: 50 African-American female Recent admission 4/96-75/9 4 metabolic encephalopathy weakness rigidity and minor change in mental status likely secondary to centrally acting meds versus dementia  HTN Known dementia since 2015   Presented to hospital with fall and left femoral neck fracture which was displaced orthopedic consulted on admission  A & Plan  Left hip fracture-status post repair 12/12/2017-Ortho to comment on various postop concerns--no O2 requirement post-op--AC per Ortho is eliquis 2.5 bid  Moderate to severe dementia-patient seems to be nonverbal at baseline and only mumbles according to son-continue meds as possible-she is intermittently been refusing and was given them in applesauce  Urinary retention-Foley placed 10/11-unsing Flomax-he has had decreased urine output which is not surprising given poor p.o. intake-we will discuss further with  HTN-stable   lovenox  DVT prophylaxis: lovenox  Code Status: F   Family Communication: Discussed with son multiple days disposition Plan:  ip    Verlon Au, MD  [facility] Direct contact: [phone] --Via amion app OR  --[email or web address]; password [email or web address]  7PM-7AM contact night coverage as above 12/15/2017, 2:32 PM  LOS: 3 days   Consultants:  ortho  Procedures:  none  Antimicrobials:  no  Interval history/Subjective:  Review of systems noncontributory patient confused and demented and unable to provide Per nursing notes and report she is not really eating much despite multiple attempts and has had a decreased urine output in addition at night she seems to desat  Objective:  Vitals:  Vitals:   12/15/17 0612 12/15/17 1215  BP: 116/68 105/63  Pulse: 83 82  Resp:  14  Temp: 98.4 F (36.9 C)   SpO2:  100%    Exam:  Awake  But non verbal and not very interactive EOMI  NCAT Chest clear, no added sound Abdomen soft LLE bandage and immobilizer on L knee   I have personally reviewed the following:   Labs:  WBC on 10/10 was 12-->10.6--10.1  Hemoglobin 13.5-->11.6-->10.5  Chem-7 potassium 3.2  Imaging studies:   Medical tests:  no  Test discussed with performing physician:  No  Decision to obtain old records:  No  Review and summation of old records:  No  Scheduled Meds: . apixaban  2.5 mg Oral BID  . buPROPion  150 mg Oral Daily  . docusate sodium  100 mg Oral BID  . feeding supplement (ENSURE ENLIVE)  237 mL Oral BID BM  . ferrous sulfate  325 mg Oral Q breakfast  . lactase  9,000 Units Oral TID AC  . mirtazapine  15 mg Oral QHS  . multivitamin with minerals  1 tablet Oral Daily  . OLANZapine  5 mg Oral QHS  . potassium chloride  40 mEq Oral Daily  . senna-docusate  1 tablet Oral BID  . sertraline  100 mg Oral Daily  . simvastatin  40 mg Oral q1800  . tamsulosin  0.4 mg Oral QPC breakfast   Continuous Infusions:   Principal Problem:   Closed left hip fracture, initial encounter ([place]) Active Problems:   Dementia with behavioral disturbance (HCC)   LOS: 3 days

## 2017-12-15 NOTE — Progress Notes (Signed)
PT  Note  Patient Details Name: Deanna [name] MRN: [id number] DOB: 14-Dec-1945   Cancelled Treatment:     PT to sign off per Dr Verlon Au 2* decline in pt condition.  Please re-order as needed if pt condition warrants.   Karn Derk 12/15/2017, 3:48 PM

## 2017-12-16 ENCOUNTER — Encounter (HOSPITAL_COMMUNITY): Payer: Self-pay | Admitting: Orthopedic Surgery

## 2017-12-16 MED ORDER — FERROUS SULFATE 325 (65 FE) MG PO TABS: 325.0000 mg | ORAL_TABLET | Freq: Every day | ORAL | 3 refills | Status: AC

## 2017-12-16 MED ORDER — APIXABAN 2.5 MG PO TABS: 2.5000 mg | ORAL_TABLET | Freq: Two times a day (BID) | ORAL | 0 refills | Status: AC

## 2017-12-16 MED ORDER — TAMSULOSIN HCL 0.4 MG PO CAPS: 0.4000 mg | ORAL_CAPSULE | Freq: Every day | ORAL | Status: AC

## 2017-12-16 MED ORDER — BISACODYL 10 MG RE SUPP: 10.0000 mg | Freq: Every day | RECTAL | 0 refills | Status: DC | PRN

## 2017-12-16 NOTE — Progress Notes (Addendum)
Subjective: 3 Days Post-Op Procedure(s) (LRB): ARTHROPLASTY BIPOLAR HIP (HEMIARTHROPLASTY) (Left) Patient reports pain as mild. Patient seen at 815 this a.m.  She is without complaints.  Seems a little more verbal than reports of over the weekend.   Objective: Vital signs in last 24 hours: Temp:  [98.5 F (36.9 C)-98.6 F (37 C)] 98.5 F (36.9 C) (10/14 0616) Pulse Rate:  [73-86] 73 (10/14 0616) Resp:  [14-16] 15 (10/14 0616) BP: (105-116)/(60-66) 110/66 (10/14 0616) SpO2:  [100 %] 100 % (10/14 0616)  Intake/Output from previous day: 10/13 0701 - 10/14 0700 In: 240 [P.O.:240] Out: 700 [Urine:700] Intake/Output this shift: Total I/O In: 240 [P.O.:240] Out: 1 [Stool:1]  Recent Labs    12/14/17 0427 12/15/17 0344  HGB 11.6* 10.5*   Recent Labs    12/14/17 0427 12/15/17 0344  WBC 10.6* 10.1  RBC 4.26 3.86*  HCT 37.7 33.8*  PLT 656* 641*   Recent Labs    12/14/17 0427 12/15/17 0344  NA 141 142  K 4.1 3.2*  CL 103 105  CO2 28 30  BUN 10 8  CREATININE 0.77 0.62  GLUCOSE 117* 114*  CALCIUM 8.4* 8.1*   No results for input(s): LABPT, INR in the last 72 hours. Left hip exam: Neurovascular intact Sensation intact distally Intact pulses distally Dorsiflexion/Plantar flexion intact Incision: dressing C/D/I Compartment soft  No significant pain with range of motion of the hip.  Assessment/Plan: 3 Days Post-Op Procedure(s) (LRB): ARTHROPLASTY BIPOLAR HIP (HEMIARTHROPLASTY) (Left)  Plan: Okay from Orthopaedic viewpoint to discharge to skilled nursing facility When medically stable. Eliquis 2.5 mg twice daily 3 weeks postop for DVT prophylaxis. Weight-bear as tolerated left lower extremity with posterior hip precautions. Can leave current aqua cell dressing in place until seen in office by Dr. Berenice Primas. Would minimize pain medicine and mostly use Tylenol for pain. Follow-up with Dr. Berenice Primas in 2 weeks.    Erlene Senters 12/16/2017, 11:19 AM

## 2017-12-16 NOTE — NC FL2 (Signed)
[place] LEVEL OF CARE SCREENING TOOL     IDENTIFICATION  Patient Name: Deanna [name] Birthdate: [birth year] Sex: female Admission Date (Current Location): 12/12/2017  [facility] and [place] Number:  Herbalist and Address:  [facility],  [place] [address] [place]      Provider Number: [id number]  Attending Physician Name and Address:  Nita Sells, MD  Relative Name and Phone Number:  Charisse Klinefelter - son, [phone]    Current Level of Care: Hospital Recommended Level of Care:   Prior Approval Number:    Date Approved/Denied:   PASRR Number:   [id number]  Discharge Plan: SNF    Current Diagnoses: Patient Active Problem List   Diagnosis Date Noted  . Closed left hip fracture, initial encounter ([place]) 12/12/2017  . Closed fracture of left hip ([place])   . Generalized weakness 12/02/2017  . Dehydration   . Advance care planning   . Goals of care, counseling/discussion   . Malnutrition of moderate degree 09/05/2017  . Dementia with behavioral disturbance ([place]) 08/04/2015  . Increased ammonia level 06/15/2014  . Left flank pain 06/15/2014  . Cognitive changes 03/24/2014  . Fatigue 03/24/2014  . Vitreous floaters of right eye 03/11/2014  . Underweight 03/11/2014  . HTN (hypertension) 12/16/2013  . HLD (hyperlipidemia) 12/16/2013  . Dementia ([place]) 12/16/2013    Orientation RESPIRATION BLADDER Height & Weight     Self/ Responds to voice  Normal External catheter Weight: 119 lb 14.9 oz (54.4 kg) Height:  5\' 6"  (167.6 cm)  BEHAVIORAL SYMPTOMS/MOOD NEUROLOGICAL BOWEL NUTRITION STATUS      Continent Diet-See d/c summary   AMBULATORY STATUS COMMUNICATION OF NEEDS Skin   Extensive Assist Non-Verbally Other (Comment)  Surgical Wounds -Left Hip                        Personal Care Assistance Level of Assistance  Bathing, Feeding, Dressing Bathing Assistance: Maximum assistance Feeding assistance: Maximum  assistance Dressing Assistance: Maximum assistance     Functional Limitations Info  Sight, Hearing, Speech Sight Info: Adequate Hearing Info: Adequate Speech Info: Impaired    SPECIAL CARE FACTORS FREQUENCY  PT (By licensed PT), OT (By licensed OT)     PT Frequency: 5x/week  OT Frequency: 5x/week             Contractures Contractures Info: Not present    Additional Factors Info  Code Status, Allergies, Psychotropic Code Status Info: DNR Allergies Info: Allergies: Pork-derived Products Psychotropic Info: Zyprexa, Remeron, Zoloft          Current Medications (12/16/2017):  This is the current hospital active medication list Current Facility-Administered Medications  Medication Dose Route Frequency Provider Last Rate Last Dose  . acetaminophen (TYLENOL) tablet 325-650 mg  325-650 mg Oral Q6H PRN Gary Fleet, PA-C      . alum & mag hydroxide-simeth (MAALOX/MYLANTA) 200-200-20 MG/5ML suspension 30 mL  30 mL Oral Q4H PRN Gary Fleet, PA-C      . apixaban Arne Cleveland) tablet 2.5 mg  2.5 mg Oral BID Nita Sells, MD   2.5 mg at 12/16/17 0949  . bisacodyl (DULCOLAX) suppository 10 mg  10 mg Rectal Daily PRN Gary Fleet, PA-C      . buPROPion (WELLBUTRIN XL) 24 hr tablet 150 mg  150 mg Oral Daily Gary Fleet, PA-C   150 mg at 12/16/17 0948  . docusate sodium (COLACE) capsule 100 mg  100 mg Oral BID Gary Fleet, PA-C  100 mg at 12/16/17 0953  . feeding supplement (ENSURE ENLIVE) (ENSURE ENLIVE) liquid 237 mL  237 mL Oral BID BM Nita Sells, MD   237 mL at 12/16/17 0953  . ferrous sulfate tablet 325 mg  325 mg Oral Q breakfast Gary Fleet, PA-C   325 mg at 12/16/17 6378  . HYDROcodone-acetaminophen (NORCO/VICODIN) 5-325 MG per tablet 1-2 tablet  1-2 tablet Oral Q4H PRN Gary Fleet, PA-C      . lactase (LACTAID) tablet 9,000 Units  9,000 Units Oral TID Almond Lint, PA-C   9,000 Units at 12/15/17 1811  . methocarbamol (ROBAXIN) tablet 500 mg   500 mg Oral Q6H PRN Gary Fleet, PA-C      . mirtazapine (REMERON) tablet 15 mg  15 mg Oral QHS Gary Fleet, PA-C   15 mg at 12/15/17 2228  . multivitamin with minerals tablet 1 tablet  1 tablet Oral Daily Gary Fleet, PA-C   1 tablet at 12/16/17 0949  . OLANZapine (ZYPREXA) tablet 5 mg  5 mg Oral QHS Gary Fleet, PA-C   5 mg at 12/15/17 2228  . ondansetron (ZOFRAN) injection 4 mg  4 mg Intravenous Q6H PRN Gary Fleet, PA-C      . ondansetron [facility]) tablet 4 mg  4 mg Oral Q6H PRN Gary Fleet, PA-C       Or  . ondansetron [facility]) injection 4 mg  4 mg Intravenous Q6H PRN Gary Fleet, PA-C      . polyethylene glycol (MIRALAX / GLYCOLAX) packet 17 g  17 g Oral Daily PRN Gary Fleet, PA-C      . potassium chloride SA (K-DUR,KLOR-CON) CR tablet 40 mEq  40 mEq Oral Daily Nita Sells, MD   40 mEq at 12/16/17 0949  . senna-docusate (Senokot-S) tablet 1 tablet  1 tablet Oral BID Gary Fleet, PA-C   1 tablet at 12/16/17 0949  . sertraline (ZOLOFT) tablet 100 mg  100 mg Oral Daily Gary Fleet, PA-C   100 mg at 12/16/17 0948  . simvastatin (ZOCOR) tablet 40 mg  40 mg Oral q1800 Gary Fleet, PA-C   40 mg at 12/15/17 1814  . tamsulosin (FLOMAX) capsule 0.4 mg  0.4 mg Oral QPC breakfast Gary Fleet, PA-C   0.4 mg at 12/16/17 5885     Discharge Medications: Please see discharge summary for a list of discharge medications.  Relevant Imaging Results:  Relevant Lab Results:   Additional Information ss#[id number].  Lia Hopping, LCSW

## 2017-12-16 NOTE — Discharge Summary (Signed)
Physician Discharge Summary  Deanna [name] GNF:[id number] DOB: Jul 25, 1945 DOA: 12/12/2017  PCP: Brayton Caves, PA-C  Admit date: 12/12/2017 Discharge date: 12/16/2017  Time spent: 25 minutes  Recommendations for Outpatient Follow-up:  1. WBAT post hip precautions 2. Needs bmet and cbc 1 week 3. Please persue OP goals of care and maybe hopsice if failing to thrive/participation lacking with therapy 4. Stop apixaban 01/22/18 5. Schedule OV Dr. Berenice Primas 2 weeks out from surgery 10/10  Discharge Diagnoses:  Principal Problem:   Closed left hip fracture, initial encounter [facility]) Active Problems:   Dementia with behavioral disturbance [facility])   Discharge Condition: poor  Diet recommendation: hh  Filed Weights   12/13/17 1332  Weight: 54.4 kg    History of present illness:  13 African-American female Recent admission 8/46-96/2 4 metabolic encephalopathy weakness rigidity and minor change in mental status likely secondary to centrally acting meds versus dementia  HTN Known dementia since 2015   Presented to hospital with fall and left femoral neck fracture which was displaced orthopedic consulted on admission  Hospital Course:   Left hip fracture-status post repair 12/12/2017-Ortho to comment on various postop concerns--no O2 requirement post-op--AC per Ortho is eliquis 2.5 bid x4-6 wk ending 11/20  Moderate to severe dementia-patient seems to be nonverbal at baseline and only mumbles according to son-continue meds as possible-she is intermittently been refusing and was given them in applesauce--give meds as able--decrease polypharmacy in OP to also decrease pill-burden  Urinary retention-Foley placed 10/11-unsing Flomax-he has had decreased urine output trasnientyl--does need FOley indwelling on d/c to facility  HTN-stable   Procedures:  Hip repair 10/10  Consultations:  ortho graves  Discharge Exam: Vitals:   12/15/17 2236 12/16/17 0616  BP: 116/66 110/66  Pulse:  86 73  Resp: 16 15  Temp: 98.6 F (37 C) 98.5 F (36.9 C)  SpO2: 100% 100%    General: awake but nonverbal--closing her mouth tight Drank ensure and took some meds last pm Cardiovascular: s1 s2 no m/r/g Respiratory: clear  abd soft nt dn  Discharge Instructions   Discharge Instructions    Diet - low sodium heart healthy   Complete by:  As directed    Increase activity slowly   Complete by:  As directed    Weight bearing as tolerated   Complete by:  As directed    Laterality:  left   Extremity:  Lower   With posterior hip precautions.     Allergies as of 12/16/2017      Reactions   Pork-derived Products       Medication List    TAKE these medications   apixaban 2.5 MG Tabs tablet Commonly known as:  ELIQUIS Take 1 tablet (2.5 mg total) by mouth 2 (two) times daily.   bisacodyl 10 MG suppository Commonly known as:  DULCOLAX Place 1 suppository (10 mg total) rectally daily as needed for moderate constipation.   buPROPion 150 MG 24 hr tablet Commonly known as:  WELLBUTRIN XL Take 1 tablet (150 mg total) by mouth daily.   feeding supplement (ENSURE ENLIVE) Liqd Take 237 mLs by mouth 3 (three) times daily between meals.   ferrous sulfate 325 (65 FE) MG tablet Take 1 tablet (325 mg total) by mouth daily with breakfast.   HYDROcodone-acetaminophen 5-325 MG tablet Commonly known as:  NORCO/VICODIN Take 1 tablet by mouth every 6 (six) hours as needed for moderate pain.   lactase 3000 units tablet Commonly known as:  LACTAID Take 9,000 Units by mouth  3 (three) times daily before meals.   mirtazapine 15 MG tablet Commonly known as:  REMERON Take 15 mg by mouth at bedtime.   OLANZapine 5 MG tablet Commonly known as:  ZYPREXA Take 1 tablet (5 mg total) by mouth at bedtime.   polyethylene glycol packet Commonly known as:  MIRALAX / GLYCOLAX Take 17 g by mouth daily as needed (constipation).   senna-docusate 8.6-50 MG tablet Commonly known as:   Senokot-S Take 1 tablet by mouth 2 (two) times daily.   sertraline 100 MG tablet Commonly known as:  ZOLOFT Take 100 mg by mouth daily.   simvastatin 40 MG tablet Commonly known as:  ZOCOR Take 40 mg by mouth 2 (two) times daily.   tamsulosin 0.4 MG Caps capsule Commonly known as:  FLOMAX Take 1 capsule (0.4 mg total) by mouth daily after breakfast.   THERA Tabs Take 1 tablet by mouth daily.            Discharge Care Instructions  (From admission, onward)         Start     Ordered   12/13/17 0000  Weight bearing as tolerated    Question Answer Comment  Laterality left   Extremity Lower      12/13/17 1636         Allergies  Allergen Reactions  . Pork-Derived Products    Follow-up Information    Dorna Leitz, MD. Schedule an appointment as soon as possible for a visit in 2 weeks.   Specialty:  Orthopedic Surgery Contact information: [place] [place] [address] [phone]            The results of significant diagnostics from this hospitalization (including imaging, microbiology, ancillary and laboratory) are listed below for reference.    Significant Diagnostic Studies: Dg Chest 1 View  Result Date: 12/12/2017 CLINICAL DATA:  Left hip fracture. EXAM: CHEST  1 VIEW COMPARISON:  12/02/2017 FINDINGS: Cardiac silhouette is normal in size. No mediastinal hilar masses. No evidence of adenopathy. Clear lungs. No pleural effusion or pneumothorax noted on this supine exam. Skeletal structures are grossly intact. IMPRESSION: No active disease. Electronically Signed   By: Lajean Manes M.D.   On: 12/12/2017 19:04   Ct Head Wo Contrast  Result Date: 12/09/2017 CLINICAL DATA:  Fall, head and neck pain EXAM: CT HEAD WITHOUT CONTRAST CT CERVICAL SPINE WITHOUT CONTRAST TECHNIQUE: Multidetector CT imaging of the head and cervical spine was performed following the standard protocol without intravenous contrast. Multiplanar CT image reconstructions of the  cervical spine were also generated. COMPARISON:  12/02/2017 FINDINGS: CT HEAD FINDINGS Brain: There is atrophy and chronic small vessel disease changes. No acute intracranial abnormality. Specifically, no hemorrhage, hydrocephalus, mass lesion, acute infarction, or significant intracranial injury. Vascular: No hyperdense vessel or unexpected calcification. Skull: No acute calvarial abnormality. Sinuses/Orbits: Visualized paranasal sinuses and mastoids clear. Orbital soft tissues unremarkable. Other: None CT CERVICAL SPINE FINDINGS Alignment: Normal Skull base and vertebrae: No acute fracture. No primary bone lesion or focal pathologic process. Soft tissues and spinal canal: No prevertebral fluid or swelling. No visible canal hematoma. Disc levels: Moderate degenerative disc and facet disease. Disc space narrowing diffusely. Mild anterior and posterior spurring. Upper chest: No acute findings Other: No acute findings IMPRESSION: No acute intracranial abnormality. Atrophy, chronic microvascular disease. Degenerative disc and facet disease diffusely throughout the cervical spine. No acute bony abnormality. Electronically Signed   By: Rolm Baptise M.D.   On: 12/09/2017 09:35   Ct Head  Wo Contrast  Result Date: 12/02/2017 CLINICAL DATA:  Altered level of consciousness.  Dementia EXAM: CT HEAD WITHOUT CONTRAST TECHNIQUE: Contiguous axial images were obtained from the base of the skull through the vertex without intravenous contrast. COMPARISON:  09/04/2017 FINDINGS: Brain: No evidence of acute infarction, hemorrhage, hydrocephalus, extra-axial collection or mass lesion/mass effect. Atrophy most notable in the medial temporal lobes, suggesting Alzheimer's disease in this patient with history of dementia. Chronic small vessel ischemia including remote lacunar infarct in the left corona radiata. Vascular: Atherosclerotic calcification Skull: Negative Sinuses/Orbits: Negative Other: Limited study due to degree of streak  artifact and head tilt. IMPRESSION: No acute finding.  Stable from 09/04/2017. Electronically Signed   By: Monte Fantasia M.D.   On: 12/02/2017 13:21   Ct Cervical Spine Wo Contrast  Result Date: 12/09/2017 CLINICAL DATA:  Fall, head and neck pain EXAM: CT HEAD WITHOUT CONTRAST CT CERVICAL SPINE WITHOUT CONTRAST TECHNIQUE: Multidetector CT imaging of the head and cervical spine was performed following the standard protocol without intravenous contrast. Multiplanar CT image reconstructions of the cervical spine were also generated. COMPARISON:  12/02/2017 FINDINGS: CT HEAD FINDINGS Brain: There is atrophy and chronic small vessel disease changes. No acute intracranial abnormality. Specifically, no hemorrhage, hydrocephalus, mass lesion, acute infarction, or significant intracranial injury. Vascular: No hyperdense vessel or unexpected calcification. Skull: No acute calvarial abnormality. Sinuses/Orbits: Visualized paranasal sinuses and mastoids clear. Orbital soft tissues unremarkable. Other: None CT CERVICAL SPINE FINDINGS Alignment: Normal Skull base and vertebrae: No acute fracture. No primary bone lesion or focal pathologic process. Soft tissues and spinal canal: No prevertebral fluid or swelling. No visible canal hematoma. Disc levels: Moderate degenerative disc and facet disease. Disc space narrowing diffusely. Mild anterior and posterior spurring. Upper chest: No acute findings Other: No acute findings IMPRESSION: No acute intracranial abnormality. Atrophy, chronic microvascular disease. Degenerative disc and facet disease diffusely throughout the cervical spine. No acute bony abnormality. Electronically Signed   By: Rolm Baptise M.D.   On: 12/09/2017 09:35   Pelvis Portable  Result Date: 12/13/2017 CLINICAL DATA:  Patient status post left hip arthroplasty. EXAM: PORTABLE PELVIS 1-2 VIEWS COMPARISON:  None. FINDINGS: Interval left hip arthroplasty. Hardware appears intact. Postsurgical changes within  the overlying soft tissues. IMPRESSION: Status post interval left hip arthroplasty. Electronically Signed   By: Lovey Newcomer M.D.   On: 12/13/2017 18:54   Dg Chest Portable 1 View  Result Date: 12/02/2017 CLINICAL DATA:  Altered mental status EXAM: PORTABLE CHEST 1 VIEW COMPARISON:  Chest radiograph 09/04/2017 FINDINGS: Monitoring leads overlie the patient. Stable cardiac and mediastinal contours. Aortic atherosclerosis. Patchy opacities right upper hemithorax. No pleural effusion or pneumothorax. IMPRESSION: Patchy opacities right upper hemithorax may represent infection. Underlying nodule not excluded. Followup PA and lateral chest X-ray is recommended in 3-4 weeks following trial of antibiotic therapy to ensure resolution and exclude underlying malignancy. Electronically Signed   By: Lovey Newcomer M.D.   On: 12/02/2017 12:55   Dg Hip Unilat W Or Wo Pelvis 2-3 Views Left  Result Date: 12/12/2017 CLINICAL DATA:  [place] 9 days ago. Reportedly had a hip fracture seen on x-ray at that time. EXAM: DG HIP (WITH OR WITHOUT PELVIS) 2-3V LEFT COMPARISON:  None. FINDINGS: There is a non comminuted, mildly displaced, fracture of the mid femoral neck. Distal fracture component has displaced superiorly by 1.5 cm. No other fractures. The hip joints, SI joints and symphysis pubis are normally aligned. Bones are diffusely demineralized. There is diffuse soft tissue edema/hemorrhage overlying  the hip fracture. IMPRESSION: 1. Displaced, non comminuted, mid left femoral neck fracture. Electronically Signed   By: Lajean Manes M.D.   On: 12/12/2017 19:03    Microbiology: No results found for this or any previous visit (from the past 240 hour(s)).   Labs: Basic Metabolic Panel: Recent Labs  Lab 12/12/17 1825 12/13/17 0527 12/14/17 0427 12/15/17 0344  NA 143 145 141 142  K 4.0 3.7 4.1 3.2*  CL 101 106 103 105  CO2 29 30 28 30   GLUCOSE 111* 96 117* 114*  BUN 12 15 10 8   CREATININE 0.79 0.77 0.77 0.62  CALCIUM  9.1 9.0 8.4* 8.1*   Liver Function Tests: Recent Labs  Lab 12/14/17 0427 12/15/17 0344  AST 27 22  ALT 16 13  ALKPHOS 57 49  BILITOT 1.0 1.1  PROT 6.2* 5.3*  ALBUMIN 3.5 2.8*   No results for input(s): LIPASE, AMYLASE in the last 168 hours. No results for input(s): AMMONIA in the last 168 hours. CBC: Recent Labs  Lab 12/12/17 1825 12/14/17 0427 12/15/17 0344  WBC 12.1* 10.6* 10.1  NEUTROABS 9.5* 8.6* 7.6  HGB 13.5 11.6* 10.5*  HCT 43.5 37.7 33.8*  MCV 88.8 88.5 87.6  PLT 614* 656* 641*   Cardiac Enzymes: No results for input(s): CKTOTAL, CKMB, CKMBINDEX, TROPONINI in the last 168 hours. BNP: BNP (last 3 results) No results for input(s): BNP in the last 8760 hours.  ProBNP (last 3 results) No results for input(s): PROBNP in the last 8760 hours.  CBG: No results for input(s): GLUCAP in the last 168 hours.     Signed:  Nita Sells MD   [facility] 12/16/2017, 8:48 AM

## 2017-12-16 NOTE — Progress Notes (Signed)
  Speech Language Pathology Treatment: Dysphagia  Patient Details Name: Deanna [name] MRN: [id number] DOB: 15-May-1945 Today's Date: 12/16/2017 Time: 0518-3358 SLP Time Calculation (min) (ACUTE ONLY): 12 min  Assessment / Plan / Recommendation Clinical Impression  Discussed with RN who reports pt is tolerating puree/thin diet without indication of aspiration. She does not open her mouth adequately for intake of solids requiring mastication.    Provided pt with intake of Ensure and water via straw.  Intermittent delay in reflexive swallow response noted and occasional multiple swallow *? Oral piecemealing or pharyngeal residuals.     NT reports pt not able to get oatmeal on her Dys1/thin diet - SLP called service response for clarification - per service response, kitchen must be able to puree the oatmeal.  SLP wrote order that pt may have oatmeal that is not pureed as that is what she desired per NT.    Recommend maximize liquid nutrition for this pt given her level of dysphagia.   SLP to sign off, recommend continue diet with supervision - having pt help to hold her own cup to modify amount.      HPI HPI: 72 y.o. female with medical history significant for dementia with behavioral disturbance, hyperlipidemia, and recent fall out of bed at her ALF, presented with fall and left femoral neck fracture, s/p Left hemiarthroplasty.      SLP Plan  All goals met       Recommendations  Diet recommendations: Dysphagia 1 (puree);Thin liquid Liquids provided via: Cup;Straw Medication Administration: Crushed with puree Supervision: Full supervision/cueing for compensatory strategies Compensations: Slow rate;Small sips/bites;Minimize environmental distractions(pt can help hold her cup to indicate desire for more or ceasing po) Postural Changes and/or Swallow Maneuvers: Seated upright 90 degrees;Upright 30-60 min after meal(as upright as able to manage)                Oral Care Recommendations:  Oral care BID Follow up Recommendations: Skilled Nursing facility SLP Visit Diagnosis: Dysphagia, unspecified (R13.10) Plan: All goals met       GO                [name] 12/16/2017, 5:22 PM  [name], [facility] SLP [place] Pager [phone] Office [phone]

## 2017-12-16 NOTE — Progress Notes (Addendum)
CSW attempted to reach out to the patient son 2x to discuss patient discharge plan. Voicemail full, unable to leave a message.  CSW will continue to reach patient son.   CSW reached out to [facility] staff- care Art gallery manager. The patient is receiving  maximum assistance care at the ALF. Due to the patient recent surgery the facility is requesting the patient go to rehab at [facility] before returning.    CSW still unable to reach the patient son by phone.  CSW left a copy of the  Patient bed offer in room and contact information.  CSW inform the floor nursing staff.  Kathrin Greathouse, Marlinda Mike, MSW Clinical Social Worker  [phone] 12/16/2017  10:40 AM

## 2017-12-16 NOTE — Care Management Important Message (Signed)
Important Message  Patient Details  Name: Deanna [name] MRN: [id number] Date of Birth: [birth year]   Medicare Important Message Given:  Yes    Kerin Salen 12/16/2017, 1:59 PMImportant Message  Patient Details  Name: Deanna [name] MRN: [id number] Date of Birth: [birth year]   Medicare Important Message Given:  Yes    Kerin Salen 12/16/2017, 1:59 PM

## 2017-12-17 NOTE — Progress Notes (Signed)
No changes to Medical care. Updated son on phone [cell phone] to be cognisant of CSW call later this am in terms of choice Will need foley on d/c--expalined rationale to family   Verneita Griffes, MD [facility] 8:05 AM

## 2017-12-17 NOTE — Progress Notes (Signed)
Report called Deanna [name] at [facility]

## 2017-12-17 NOTE — Clinical Social Work Placement (Signed)
D/C Summary sent.  Nurse call report to: [phone] [facility] to transport.   CLINICAL SOCIAL WORK PLACEMENT  NOTE  Date:  12/17/2017  Patient Details  Name: Deanna [name] MRN: [id number] Date of Birth: 08-Nov-1945  Clinical Social Work is seeking post-discharge placement for this patient at the [place] level of care (*CSW will initial, date and re-position this form in  chart as items are completed):  Yes   Patient/family provided with [facility] Work Department's list of facilities offering this level of care within the geographic area requested by the patient (or if unable, by the patient's family).  Yes   Patient/family informed of their freedom to choose among providers that offer the needed level of care, that participate in Medicare, Medicaid or managed care program needed by the patient, have an available bed and are willing to accept the patient.  Yes   Patient/family informed of [place] [facility]'s ownership interest in [facility] and [facility], as well as of the fact that they are under no obligation to receive care at these facilities.  PASRR submitted to EDS on       PASRR number received on       Existing PASRR number confirmed on 12/16/17     FL2 transmitted to all facilities in geographic area requested by pt/family on       FL2 transmitted to all facilities within larger geographic area on 12/17/17     Patient informed that his/her managed care company has contracts with or will negotiate with certain facilities, including the following:  [facility]     Yes   Patient/family informed of bed offers received.  Patient chooses bed at [place] [facility] [place] recommends and patient chooses bed at      Patient to be transferred to [facility] on 12/17/17.  Patient to be transferred to facility by [facility]      Patient family notified on 12/17/17 of transfer.  Name of family member  notified:  Son Muhammed at bedside.      PHYSICIAN       Additional Comment:    _______________________________________________ Lia Hopping, LCSW 12/17/2017, 9:50 AM

## 2017-12-18 ENCOUNTER — Encounter: Payer: Self-pay | Admitting: Adult Health

## 2017-12-18 ENCOUNTER — Non-Acute Institutional Stay (SKILLED_NURSING_FACILITY): Payer: Medicare Other | Admitting: Adult Health

## 2017-12-18 DIAGNOSIS — R339 Retention of urine, unspecified: Secondary | ICD-10-CM

## 2017-12-18 DIAGNOSIS — F0391 Unspecified dementia with behavioral disturbance: Secondary | ICD-10-CM

## 2017-12-18 DIAGNOSIS — F339 Major depressive disorder, recurrent, unspecified: Secondary | ICD-10-CM | POA: Diagnosis not present

## 2017-12-18 DIAGNOSIS — I10 Essential (primary) hypertension: Secondary | ICD-10-CM | POA: Diagnosis not present

## 2017-12-18 DIAGNOSIS — S72002A Fracture of unspecified part of neck of left femur, initial encounter for closed fracture: Secondary | ICD-10-CM | POA: Diagnosis not present

## 2017-12-18 DIAGNOSIS — D62 Acute posthemorrhagic anemia: Secondary | ICD-10-CM

## 2017-12-18 DIAGNOSIS — F0390 Unspecified dementia without behavioral disturbance: Secondary | ICD-10-CM

## 2017-12-18 NOTE — Progress Notes (Signed)
Location:  [facility] Room Number: 120-A Place of Service:  SNF (31) Provider:  Durenda Age, NP  Patient Care Team: Su Ley as PCP - General (Physician Assistant)  Extended Emergency Contact Information Primary Emergency Contact: Pundt,Muhammad Address: [address]          [place], [place] [address] Johnnette Litter of [place] Work Phone: [phone] Mobile Phone: [phone] Relation: Son  Code Status:  DNR  Goals of care: Advanced Directive information Advanced Directives 12/18/2017  Does Patient Have a Medical Advance Directive? Yes  Type of Advance Directive Out of facility DNR (pink MOST or yellow form)  Does patient want to make changes to medical advance directive? No - Patient declined  Copy of [place] in Chart? -  Would patient like information on creating a medical advance directive? -  Pre-existing out of facility DNR order (yellow form or pink MOST form) -     Chief Complaint  Patient presents with  . Acute Visit    Patient is seen for hospital followup, status post hospitalization at [facility] 10/10-10/15 for fall with closed left hip fracture.    HPI:  Pt is a 72 y.o. female seen today for hospital followup.  She was admitted to [facility] on 12/17/17 for short-term rehabilitation following hospitalization at [facility] 10/10-10/15/19 for fall with resultant closed left hip fracture.   She has a PMH of dementia with behavioral disturbance, hypertension, and HLD.  She apparently fell several days prior to being sent to the hospital.  She resides at an assisted living facility and was walking independently at baseline.  She complained of persistent left hip pain following her fall.  X-ray revealed left femoral neck fracture and was transferred to the hospital.  She had repair done on 12/12/17.  She had urinary retention while in the hospital. Foley catheter was inserted and was started on  Flomax.  She was seen today and was seen sleeping. She did not open her eyes but was moving her BUE.  Past Medical History:  Diagnosis Date  . Dementia ([place]) Dx 2015  . Hyperlipidemia DX 2015  . Hypertension Dx 2015   Past Surgical History:  Procedure Laterality Date  . [place]   . HIP ARTHROPLASTY Left 12/13/2017   Procedure: ARTHROPLASTY BIPOLAR HIP (HEMIARTHROPLASTY);  Surgeon: Dorna Leitz, MD;  Location: [facility] ORS;  Service: Orthopedics;  Laterality: Left;    Allergies  Allergen Reactions  . Pork-Derived Products     Outpatient Encounter Medications as of 12/18/2017  Medication Sig  . apixaban (ELIQUIS) 2.5 MG TABS tablet Take 1 tablet (2.5 mg total) by mouth 2 (two) times daily.  . bisacodyl (DULCOLAX) 10 MG suppository Place 1 suppository (10 mg total) rectally daily as needed for moderate constipation.  Marland Kitchen buPROPion (WELLBUTRIN SR) 150 MG 12 hr tablet Take 150 mg by mouth daily.  . feeding supplement, ENSURE ENLIVE, (ENSURE ENLIVE) LIQD Take 237 mLs by mouth 3 (three) times daily between meals.  . ferrous sulfate 325 (65 FE) MG tablet Take 1 tablet (325 mg total) by mouth daily with breakfast.  . HYDROcodone-acetaminophen (NORCO) 5-325 MG tablet Take 1 tablet by mouth every 6 (six) hours as needed for moderate pain.  Marland Kitchen lactase (LACTAID) 3000 units tablet Take 9,000 Units by mouth 3 (three) times daily before meals.  . mirtazapine (REMERON) 15 MG tablet Take 15 mg by mouth at bedtime.  . Multiple Vitamin (THERA) TABS Take 1 tablet  by mouth daily.   Marland Kitchen OLANZapine (ZYPREXA) 5 MG tablet Take 1 tablet (5 mg total) by mouth at bedtime.  . polyethylene glycol (MIRALAX / GLYCOLAX) packet Take 17 g by mouth daily as needed (constipation).  Marland Kitchen senna-docusate (SENOKOT-S) 8.6-50 MG tablet Take 1 tablet by mouth 2 (two) times daily.   . sertraline (ZOLOFT) 100 MG tablet Take 100 mg by mouth daily.   . simvastatin (ZOCOR) 40 MG tablet Take 40 mg by mouth daily at 6 PM.     . tamsulosin (FLOMAX) 0.4 MG CAPS capsule Take 1 capsule (0.4 mg total) by mouth daily after breakfast.  . [DISCONTINUED] buPROPion (WELLBUTRIN XL) 150 MG 24 hr tablet Take 1 tablet (150 mg total) by mouth daily.   No facility-administered encounter medications on file as of 12/18/2017.     Review of Systems Unable to obtain due to dementia    Immunization History  Administered Date(s) Administered  . Influenza,inj,Quad PF,6+ Mos 12/16/2013  . PPD Test 11/07/2015  . Tdap 09/01/2016   Pertinent  Health Maintenance Due  Topic Date Due  . MAMMOGRAM  04/23/1997  . COLONOSCOPY  04/23/1997  . DEXA SCAN  04/23/2012  . PNA vac Low Risk Adult (1 of 2 - PCV13) 04/23/2012  . INFLUENZA VACCINE  10/03/2017   Fall Risk  06/15/2014 12/16/2013  Falls in the past year? No No      Vitals:   12/18/17 1102  BP: 121/72  Pulse: 85  Resp: 18  Temp: 97.7 F (36.5 C)  TempSrc: Oral  SpO2: 100%  Weight: 119 lb 14.9 oz (54.4 kg)  Height: 5\' 6"  (1.676 m)   Body mass index is 19.36 kg/m.  Physical Exam  GENERAL APPEARANCE:  In no acute distress.  SKIN:  Left hip surgical incision covered with Aquacel dressing, dry and no redness MOUTH and THROAT: Lips are without lesions.  RESPIRATORY: Breathing is even & unlabored, BS CTAB CARDIAC: RRR, no murmur,no extra heart sounds, no edema GI: Abdomen soft, normal BS, no masses, no tenderness GU:  Has foley catheter draining to urine bag EXTREMITIES: No edema  PSYCHIATRIC:  Affect and behavior are appropriate  Labs reviewed: Recent Labs    12/13/17 0527 12/14/17 0427 12/15/17 0344  NA 145 141 142  K 3.7 4.1 3.2*  CL 106 103 105  CO2 30 28 30   GLUCOSE 96 117* 114*  BUN 15 10 8   CREATININE 0.77 0.77 0.62  CALCIUM 9.0 8.4* 8.1*   Recent Labs    12/02/17 1228 12/14/17 0427 12/15/17 0344  AST 24 27 22   ALT 16 16 13   ALKPHOS 64 57 49  BILITOT 0.6 1.0 1.1  PROT 6.6 6.2* 5.3*  ALBUMIN 3.8 3.5 2.8*   Recent Labs    12/12/17 1825  12/14/17 0427 12/15/17 0344  WBC 12.1* 10.6* 10.1  NEUTROABS 9.5* 8.6* 7.6  HGB 13.5 11.6* 10.5*  HCT 43.5 37.7 33.8*  MCV 88.8 88.5 87.6  PLT 614* 656* 641*   Lab Results  Component Value Date   TSH 1.161 09/05/2017    Lab Results  Component Value Date   CHOL 220 (H) 12/16/2013   HDL 61 12/16/2013   LDLCALC 139 (H) 12/16/2013   TRIG 98 12/16/2013   CHOLHDL 3.6 12/16/2013    Significant Diagnostic Results in last 30 days:  Dg Chest 1 View  Result Date: 12/12/2017 CLINICAL DATA:  Left hip fracture. EXAM: CHEST  1 VIEW COMPARISON:  12/02/2017 FINDINGS: Cardiac silhouette is normal in size. No  mediastinal hilar masses. No evidence of adenopathy. Clear lungs. No pleural effusion or pneumothorax noted on this supine exam. Skeletal structures are grossly intact. IMPRESSION: No active disease. Electronically Signed   By: Lajean Manes M.D.   On: 12/12/2017 19:04   Ct Head Wo Contrast  Result Date: 12/09/2017 CLINICAL DATA:  Fall, head and neck pain EXAM: CT HEAD WITHOUT CONTRAST CT CERVICAL SPINE WITHOUT CONTRAST TECHNIQUE: Multidetector CT imaging of the head and cervical spine was performed following the standard protocol without intravenous contrast. Multiplanar CT image reconstructions of the cervical spine were also generated. COMPARISON:  12/02/2017 FINDINGS: CT HEAD FINDINGS Brain: There is atrophy and chronic small vessel disease changes. No acute intracranial abnormality. Specifically, no hemorrhage, hydrocephalus, mass lesion, acute infarction, or significant intracranial injury. Vascular: No hyperdense vessel or unexpected calcification. Skull: No acute calvarial abnormality. Sinuses/Orbits: Visualized paranasal sinuses and mastoids clear. Orbital soft tissues unremarkable. Other: None CT CERVICAL SPINE FINDINGS Alignment: Normal Skull base and vertebrae: No acute fracture. No primary bone lesion or focal pathologic process. Soft tissues and spinal canal: No prevertebral fluid or  swelling. No visible canal hematoma. Disc levels: Moderate degenerative disc and facet disease. Disc space narrowing diffusely. Mild anterior and posterior spurring. Upper chest: No acute findings Other: No acute findings IMPRESSION: No acute intracranial abnormality. Atrophy, chronic microvascular disease. Degenerative disc and facet disease diffusely throughout the cervical spine. No acute bony abnormality. Electronically Signed   By: Rolm Baptise M.D.   On: 12/09/2017 09:35   Ct Head Wo Contrast  Result Date: 12/02/2017 CLINICAL DATA:  Altered level of consciousness.  Dementia EXAM: CT HEAD WITHOUT CONTRAST TECHNIQUE: Contiguous axial images were obtained from the base of the skull through the vertex without intravenous contrast. COMPARISON:  09/04/2017 FINDINGS: Brain: No evidence of acute infarction, hemorrhage, hydrocephalus, extra-axial collection or mass lesion/mass effect. Atrophy most notable in the medial temporal lobes, suggesting Alzheimer's disease in this patient with history of dementia. Chronic small vessel ischemia including remote lacunar infarct in the left corona radiata. Vascular: Atherosclerotic calcification Skull: Negative Sinuses/Orbits: Negative Other: Limited study due to degree of streak artifact and head tilt. IMPRESSION: No acute finding.  Stable from 09/04/2017. Electronically Signed   By: Monte Fantasia M.D.   On: 12/02/2017 13:21   Ct Cervical Spine Wo Contrast  Result Date: 12/09/2017 CLINICAL DATA:  Fall, head and neck pain EXAM: CT HEAD WITHOUT CONTRAST CT CERVICAL SPINE WITHOUT CONTRAST TECHNIQUE: Multidetector CT imaging of the head and cervical spine was performed following the standard protocol without intravenous contrast. Multiplanar CT image reconstructions of the cervical spine were also generated. COMPARISON:  12/02/2017 FINDINGS: CT HEAD FINDINGS Brain: There is atrophy and chronic small vessel disease changes. No acute intracranial abnormality. Specifically,  no hemorrhage, hydrocephalus, mass lesion, acute infarction, or significant intracranial injury. Vascular: No hyperdense vessel or unexpected calcification. Skull: No acute calvarial abnormality. Sinuses/Orbits: Visualized paranasal sinuses and mastoids clear. Orbital soft tissues unremarkable. Other: None CT CERVICAL SPINE FINDINGS Alignment: Normal Skull base and vertebrae: No acute fracture. No primary bone lesion or focal pathologic process. Soft tissues and spinal canal: No prevertebral fluid or swelling. No visible canal hematoma. Disc levels: Moderate degenerative disc and facet disease. Disc space narrowing diffusely. Mild anterior and posterior spurring. Upper chest: No acute findings Other: No acute findings IMPRESSION: No acute intracranial abnormality. Atrophy, chronic microvascular disease. Degenerative disc and facet disease diffusely throughout the cervical spine. No acute bony abnormality. Electronically Signed   By: Rolm Baptise M.D.  On: 12/09/2017 09:35   Pelvis Portable  Result Date: 12/13/2017 CLINICAL DATA:  Patient status post left hip arthroplasty. EXAM: PORTABLE PELVIS 1-2 VIEWS COMPARISON:  None. FINDINGS: Interval left hip arthroplasty. Hardware appears intact. Postsurgical changes within the overlying soft tissues. IMPRESSION: Status post interval left hip arthroplasty. Electronically Signed   By: Lovey Newcomer M.D.   On: 12/13/2017 18:54   Dg Chest Portable 1 View  Result Date: 12/02/2017 CLINICAL DATA:  Altered mental status EXAM: PORTABLE CHEST 1 VIEW COMPARISON:  Chest radiograph 09/04/2017 FINDINGS: Monitoring leads overlie the patient. Stable cardiac and mediastinal contours. Aortic atherosclerosis. Patchy opacities right upper hemithorax. No pleural effusion or pneumothorax. IMPRESSION: Patchy opacities right upper hemithorax may represent infection. Underlying nodule not excluded. Followup PA and lateral chest X-ray is recommended in 3-4 weeks following trial of antibiotic  therapy to ensure resolution and exclude underlying malignancy. Electronically Signed   By: Lovey Newcomer M.D.   On: 12/02/2017 12:55   Dg Hip Unilat W Or Wo Pelvis 2-3 Views Left  Result Date: 12/12/2017 CLINICAL DATA:  [place] 9 days ago. Reportedly had a hip fracture seen on x-ray at that time. EXAM: DG HIP (WITH OR WITHOUT PELVIS) 2-3V LEFT COMPARISON:  None. FINDINGS: There is a non comminuted, mildly displaced, fracture of the mid femoral neck. Distal fracture component has displaced superiorly by 1.5 cm. No other fractures. The hip joints, SI joints and symphysis pubis are normally aligned. Bones are diffusely demineralized. There is diffuse soft tissue edema/hemorrhage overlying the hip fracture. IMPRESSION: 1. Displaced, non comminuted, mid left femoral neck fracture. Electronically Signed   By: Lajean Manes M.D.   On: 12/12/2017 19:03    Assessment/Plan  1. Closed left hip fracture, initial encounter ([place]) -S/P repair on 12/12/2017, continue Eliquis 2.5 mg 1 tab twice a day (ending on 01/22/18) for DVT prophylaxis, decrease Norco 5/325 mg from every 6 hours as needed to every 8 hours as needed for pain   2. Essential hypertension - stable, not on any antihypertensive   3. Urinary retention - Foley catheter was inserted in the hospital, continue Flomax 0.4 mg 1 capsule daily   4. Depression, recurrent ([facility]) -continue bupropion 150 mg 1 tab daily and simvastatin 40 mg 1 tab daily, psych consult with [facility]   5. Anemia due to blood loss, acute - will monitor Lab Results  Component Value Date   HGB 10.5 (L) 12/15/2017    6. Malnutrition of moderate degree - Body mass index is 19.36 kg/m.,  Continue mirtazapine 15 mg 1 tab daily for appetite stimulant, Thera tablet 1 tab daily, refer to RD for additional supplementation  Last Weight  Most recent update: 12/18/2017 11:03 AM   Weight  54.4 kg (119 lb 14.9 oz)            7. Dementia with behavioral disturbance, unspecified  dementia type ([place]) - continue supportive care, fall precautions, continue Zyprexa 5 mg 1 tab nightly    Family/ staff Communication: Discussed plan of care with charge nurse.  Labs/tests ordered: CBC in 1 week  Goals of care:   Short-term rehabilitation.   Durenda Age, NP [facility] and Adult Medicine [phone] (Monday-Friday 8:00 a.m. - 5:00 p.m.) [phone] (after hours)

## 2017-12-19 ENCOUNTER — Non-Acute Institutional Stay (SKILLED_NURSING_FACILITY): Payer: Medicare Other | Admitting: Internal Medicine

## 2017-12-19 ENCOUNTER — Encounter: Payer: Self-pay | Admitting: Internal Medicine

## 2017-12-19 DIAGNOSIS — E785 Hyperlipidemia, unspecified: Secondary | ICD-10-CM | POA: Diagnosis not present

## 2017-12-19 DIAGNOSIS — S72002D Fracture of unspecified part of neck of left femur, subsequent encounter for closed fracture with routine healing: Secondary | ICD-10-CM

## 2017-12-19 DIAGNOSIS — I10 Essential (primary) hypertension: Secondary | ICD-10-CM | POA: Diagnosis not present

## 2017-12-19 DIAGNOSIS — F0391 Unspecified dementia with behavioral disturbance: Secondary | ICD-10-CM

## 2017-12-19 NOTE — Assessment & Plan Note (Addendum)
Son has requested deprescribing She is on generic Zoloft, Zyprexa, Remeron, and Wellbutrin this regimen should be reconciled with the regimen prescribed at the memory care unit at [facility].  Her son will attempt to obtain a copy of her medications prior to admission to the hospital. Psych NP monitor while at the [facility]

## 2017-12-19 NOTE — Progress Notes (Signed)
NURSING HOME LOCATION:  [facility] ROOM NUMBER:  120-A  CODE STATUS:  DNR  PCP:  Brayton Caves, PA-C  [place] [address]  This is a comprehensive admission note to [facility] performed on this date less than 30 days from date of admission. Included are preadmission medical/surgical history; reconciled medication list; family history; social history and comprehensive review of systems.   Corrections and additions to the records were documented. Comprehensive physical exam was also performed. Additionally a clinical summary was entered for each active diagnosis pertinent to this admission in the Problem List to enhance continuity of care.  HPI: Patient was hospitalized 10/10-10/14/2019 for closed left hip fracture in the context of advanced dementia.  Left hip arthroplasty was accomplished 12/12/2017 by Dr Berenice Primas. Hospital course was complicated by urinary retention, Foley was placed 10/11 and tamsulosin initiated. Apixaban (Eliquis 2.5 mg twice daily ) for DVT prophylaxis was to be discontinued 01/22/2018. Labs prior to discharge included potassium 3.2, glucose 114, total protein 5.3, hemoglobin 10.5/hematocrit 33.8 with normochromic, normocytic indices. Follow-up outpatient appointment with Dr. Berenice Primas was to be the week of 10/24.  Past medical and surgical history: Includes hypertension and dyslipidemia. The patient has not had a heart attack or stroke but has been on a statin because of elevated lipids. Surgeries also include C-section x2.  Social history: Nondrinker; never smoked.Former Marine scientist  Family history: Noncontributory due to age. The son states that there is no family history of heart attack or stroke.    Review of systems: Thankfully her son was present to provide history.  The patient has advanced dementia and is essentially nonverbal.  He states that she is been in the memory care unit at [facility] since September 2017.  [facility]  [employer] attend her at that facility.  She is typically seen by Lesia Hausen, PA.  Son states that his mother apparently fell from bed breaking her hip.  He states that she does have "violent, vivid dreams".  On occasion she is noted to be possibly sleepwalking.  He is concerned that she is less communicative here and feels that she is overmedicated.  He is requesting deprescribing.  The problem list includes dementia with behavioral dysfunction.  Apparently this is wandering. They once found [place] busy [place] street.      Physical exam:  Pertinent or positive findings: She appears frail and poorly nourished.  She is nonverbal.  She does have non-focusing of her eyes when the eyelids are open.  For the most part she keep her eyes closed.  Her mouth is sunken resulting in a scowling type appearance.  She is edentulous.  There is hirsutism over the chin.  Slight tachycardia is present. Foley present. Limb atrophy is present.  She does resist range of motion testing. Limbs are not flaccid, but tone is diminished. The dorsalis pedis pulses are slightly decreased.  General appearance: no acute distress, increased work of breathing is present.   Lymphatic: No lymphadenopathy about the head, neck, axilla. Eyes: No conjunctival inflammation or lid edema is present. There is no scleral icterus. Ears:  External ear exam shows no significant lesions or deformities.   Nose:  External nasal examination shows no deformity or inflammation. Nasal mucosa are pink and moist without lesions, exudates Oral exam: Lips and gums are healthy appearing.There is no oropharyngeal erythema or exudate. Neck:  No thyromegaly, masses, tenderness noted.    Heart:  No gallop, murmur, click,  rub.  Lungs:  without wheezes, rhonchi, rales, rubs. Abdomen: Bowel sounds are normal.  Abdomen is soft and nontender with no organomegaly, hernias, masses. GU: Deferred  Extremities:  No cyanosis, clubbing, edema. Neurologic exam:   Balance, [place], finger to nose testing could not be completed due to clinical state Skin: Warm & dry w/o tenting. No significant lesions or rash.  See clinical summary under each active problem in the Problem List with associated updated therapeutic plan

## 2017-12-19 NOTE — Patient Instructions (Signed)
See assessment and plan under each diagnosis in the problem list and acutely for this visit 

## 2017-12-19 NOTE — Assessment & Plan Note (Signed)
Orthopedic follow-up appointment the week of 12/26/2017

## 2017-12-19 NOTE — Assessment & Plan Note (Signed)
BP controlled; no change in antihypertensive medications  

## 2017-12-19 NOTE — Assessment & Plan Note (Addendum)
Based on literature recommendations, discontinue statin as part of deprescribing

## 2017-12-20 LAB — CBC AND DIFFERENTIAL
HEMATOCRIT: 38 (ref 36–46)
HEMOGLOBIN: 11.8 — AB (ref 12.0–16.0)
NEUTROS ABS: 13
PLATELETS: 959 — AB (ref 150–399)
WBC: 15.8

## 2017-12-24 ENCOUNTER — Non-Acute Institutional Stay: Payer: Medicare Other | Admitting: Primary Care

## 2017-12-24 DIAGNOSIS — Z515 Encounter for palliative care: Secondary | ICD-10-CM

## 2017-12-24 DIAGNOSIS — Z7189 Other specified counseling: Secondary | ICD-10-CM

## 2017-12-24 DIAGNOSIS — S72002D Fracture of unspecified part of neck of left femur, subsequent encounter for closed fracture with routine healing: Secondary | ICD-10-CM

## 2017-12-24 DIAGNOSIS — F0391 Unspecified dementia with behavioral disturbance: Secondary | ICD-10-CM

## 2017-12-25 ENCOUNTER — Encounter: Payer: Self-pay | Admitting: Internal Medicine

## 2017-12-25 DIAGNOSIS — D473 Essential (hemorrhagic) thrombocythemia: Secondary | ICD-10-CM | POA: Insufficient documentation

## 2017-12-25 DIAGNOSIS — D75839 Thrombocytosis, unspecified: Secondary | ICD-10-CM | POA: Insufficient documentation

## 2017-12-25 LAB — CBC AND DIFFERENTIAL
HCT: 38 (ref 36–46)
HEMOGLOBIN: 12.6 (ref 12.0–16.0)
Neutrophils Absolute: 11
Platelets: 1198 — AB (ref 150–399)
WBC: 13

## 2017-12-25 NOTE — Progress Notes (Signed)
PALLIATIVE CARE CONSULT VISIT   PATIENT NAME: Deanna [name] DOB: 12/07/1945 MRN: [id number]  PRIMARY CARE PROVIDER:   Dr Unice Cobble REFERRING PROVIDER:  Dr. Unice Cobble, [facility].  RESPONSIBLE PARTY:    Son Katalyna Socarras [phone]  ASSESSMENT:      Deanna [name] was followed by Palliative Care at her former facility. She fell on 10/7 and had left hip fracture repair. She has baseline dementia which complicates course and history. History obtained from previous notes and facility staff.  1. Pain- Difficult to assess as patient is non verbal. Appears to have 4/10 discomfort on PAINAD scale. 2. Dementia: Fast score 7d on exam today.  3. Goals of care: DNR on chart. No MOST form found. Discussion with family about any changes in goals of care would be helpful to direct further care choices.  RECOMMENDATIONS and PLAN:  1. Pain: Has PRN norco but would recommend scheduling acetaminophen 500-650 mg q 8 h, suppository if po is precluded. 2. Dementia: Fast score 7d on exam today.  3. Goals of care- Call to son Cecilee Rosner but VM full. Left SMS  message to call Palliative office and RN will f/u with calling son. Will reach out to meet re Goals of care, discuss MOST directives. Will follow for continuing care. Return 1-2 weeks.  I spent 25 minutes providing this consultation,  from 1220 to 1245. More than 50% of the time in this consultation was spent coordinating communication.   HISTORY OF PRESENT ILLNESS:  Deanna [name] is a 72 y.o. year old female with multiple medical problems including dementia with behavior disturbances, fracture of left hip. Palliative Care was asked to help address goals of care.   CODE STATUS: DNR PPS: 20% HOSPICE ELIGIBILITY/DIAGNOSIS: yes. PPS 20%, FAST 7d, Community acquired pneumonia noted on 9/30 in epic  PAST MEDICAL HISTORY:  Past Medical History:  Diagnosis Date  . Dementia ([place]) Dx 2015  . Hyperlipidemia DX 2015  .  Hypertension Dx 2015    SOCIAL HX:  Social History   Tobacco Use  . Smoking status: Never Smoker  . Smokeless tobacco: Never Used  Substance Use Topics  . Alcohol use: No    ALLERGIES:  Allergies  Allergen Reactions  . Pork-Derived Products      PERTINENT MEDICATIONS:  Outpatient Encounter Medications as of 12/24/2017  Medication Sig  . apixaban (ELIQUIS) 2.5 MG TABS tablet Take 1 tablet (2.5 mg total) by mouth 2 (two) times daily.  . bisacodyl (DULCOLAX) 10 MG suppository Place 1 suppository (10 mg total) rectally daily as needed for moderate constipation.  Marland Kitchen buPROPion (WELLBUTRIN SR) 150 MG 12 hr tablet Take 150 mg by mouth daily.  . feeding supplement, ENSURE ENLIVE, (ENSURE ENLIVE) LIQD Take 237 mLs by mouth 3 (three) times daily between meals.  . ferrous sulfate 325 (65 FE) MG tablet Take 1 tablet (325 mg total) by mouth daily with breakfast.  . HYDROcodone-acetaminophen (NORCO) 5-325 MG tablet Take 1 tablet by mouth every 6 (six) hours as needed for moderate pain.  Marland Kitchen lactase (LACTAID) 3000 units tablet Take 9,000 Units by mouth 3 (three) times daily before meals.  . mirtazapine (REMERON) 15 MG tablet Take 15 mg by mouth at bedtime.  . Multiple Vitamin (THERA) TABS Take 1 tablet by mouth daily.   Marland Kitchen OLANZapine (ZYPREXA) 5 MG tablet Take 1 tablet (5 mg total) by mouth at bedtime.  . polyethylene glycol (MIRALAX / GLYCOLAX) packet Take 17 g by mouth daily  as needed (constipation).  Marland Kitchen senna-docusate (SENOKOT-S) 8.6-50 MG tablet Take 1 tablet by mouth 2 (two) times daily.   . sertraline (ZOLOFT) 100 MG tablet Take 100 mg by mouth daily.   . simvastatin (ZOCOR) 40 MG tablet Take 40 mg by mouth daily at 6 PM.   . tamsulosin (FLOMAX) 0.4 MG CAPS capsule Take 1 capsule (0.4 mg total) by mouth daily after breakfast.   No facility-administered encounter medications on file as of 12/24/2017.     PHYSICAL EXAM:  VS 98.1-83-18 115/69  Ht 66" wt 120 on 10/10 at hospital, BMI  19.36  General: non-verbal, frown, frail appearing, thin Cardiovascular: regular rate and rhythm, S1S2 Pulmonary: clear all fields, no cough or breathing labor Abdomen: soft, nontender, + bowel sounds GU: no suprapubic tenderness, foley in place for retention per Epic notes Extremities: no edema, moves UE but not LE. Has knee brace on L leg. Skin: no rashes, staff reports no skin breakdown Neurological: Weakness, non verbal, opens eyes and tracks, minimal interaction.   Cyndia Skeeters AGPCNP-BC

## 2018-01-09 ENCOUNTER — Encounter: Payer: Self-pay | Admitting: Internal Medicine

## 2018-01-09 ENCOUNTER — Non-Acute Institutional Stay (SKILLED_NURSING_FACILITY): Payer: Medicare Other | Admitting: Internal Medicine

## 2018-01-09 DIAGNOSIS — R630 Anorexia: Secondary | ICD-10-CM | POA: Diagnosis not present

## 2018-01-09 DIAGNOSIS — R0989 Other specified symptoms and signs involving the circulatory and respiratory systems: Secondary | ICD-10-CM

## 2018-01-09 LAB — CBC AND DIFFERENTIAL
HCT: 35 — AB (ref 36–46)
Hemoglobin: 11.1 — AB (ref 12.0–16.0)
Neutrophils Absolute: 10
PLATELETS: 760 — AB (ref 150–399)
WBC: 12.4

## 2018-01-09 LAB — BASIC METABOLIC PANEL
BUN: 24 — AB (ref 4–21)
CREATININE: 0.8 (ref 0.5–1.1)
GLUCOSE: 101
Potassium: 4 (ref 3.4–5.3)
Sodium: 145 (ref 137–147)

## 2018-01-09 NOTE — Patient Instructions (Signed)
See assessment and plan under each diagnosis in the problem list and acutely for this visit 

## 2018-01-09 NOTE — Progress Notes (Signed)
NURSING HOME LOCATION:  [facility] ROOM NUMBER:  120-A  CODE STATUS:  DNR  PCP:  Brayton Caves, PA-C  [place] [address]  This is a nursing facility follow up for specific acute issue of audible congestion as per staff.  Interim medical record and care since last [facility] visit was updated with review of diagnostic studies and change in clinical status since last visit were documented.  HPI: Staff reported that they could hear rhonchi and congestion yesterday even standing near the patient.  Today this has improved.  Speech therapy stated that yesterday she was noted to be "gurgling" with her meal but this was not the case today.  She also had anorexia yesterday.  Diet advancement from pured thin to thick is being considered by speech therapy.   Chest x-ray was ordered by the on-call physician last night.  The report was no active process.  That chest x-ray was personally reviewed and compared with a film from [facility] 12/02/2017.  On the 9/30 film there was a question of ill-defined right upper lobe patchy opacities and follow-up film in 3-4 weeks was recommended.  These have resolved.  She does have diffuse interstitial markings especially in the left lower lobe in the retrocardiac area.  There was no definite infiltrate. White blood count was 12,400.  She had a normochromic, normocytic anemia with hemoglobin 11.1 and hematocrit 34.5.  Differential revealed a left shift with 10.3 neutrophils.  She has exhibited a persistent leukocytosis with white count up to 15,800 which subsequently dropped to 13,000.  Previous hemoglobin and hematocrit values were 12.6 and 38 respectively.  BMET is unremarkable. The patient was admitted to the SNF 10/14 following left hip arthroplasty 10/10 for closed left hip fracture sustained in a fall.  This is in the setting of advanced dementia.  Eliquis 2.5 mg twice a day was initiated for DVT prophylaxis with continuation  until 11/20. Palliative care has seen the patient and feels she is Hospice eligible.  Review of systems: No history could be obtained other than from the nursing and speech therapy staffs as she is nonverbal.  Physical exam:  Pertinent or positive findings: She appears chronically ill and cachectic with visible temporal wasting.  The patient keeps her eyes closed as well as her mouth.  Her mouth is in a perpetual scowl.  She is edentulous.  She resists evaluation of the eyes or the oral cavity.  Heart sounds are somewhat distant and irregular, respiratory variation is suggested.  Breath sounds are also decreased but she has low-grade rhonchi anteriorly.  She has 1/2+ pitting edema.  Her posterior tibial pulses are palpable.  There is no spontaneous activity of the limbs.  When her arms are lifted they do drift down slowly.  Foley catheter is in place.  General appearance:  no acute distress, increased work of breathing is present.   Lymphatic: No lymphadenopathy about the head, neck, axilla. Eyes: On limited exam no conjunctival inflammation or lid edema is present. There is no scleral icterus. Ears:  External ear exam shows no significant lesions or deformities.   Nose:  External nasal examination shows no deformity or inflammation. Nasal mucosa are pink and moist without lesions, exudates Oral exam:  Lips and gums are healthy appearing.  Neck:  No thyromegaly, masses, tenderness noted.    Heart:  No gallop, murmur, click, rub .  Lungs:  without wheezes,rales, rubs. Abdomen: Bowel sounds are normal. Abdomen is soft and  nontender with no organomegaly, hernias, masses. GU: Deferred  Extremities:  No cyanosis, clubbing  Skin: Warm & dry w/o tenting. No significant lesions or rash.  See summary under each active problem in the Problem List with associated updated therapeutic plan

## 2018-01-14 ENCOUNTER — Non-Acute Institutional Stay: Payer: Medicare Other | Admitting: Primary Care

## 2018-01-14 DIAGNOSIS — S72002D Fracture of unspecified part of neck of left femur, subsequent encounter for closed fracture with routine healing: Secondary | ICD-10-CM

## 2018-01-14 DIAGNOSIS — Z515 Encounter for palliative care: Secondary | ICD-10-CM

## 2018-01-14 DIAGNOSIS — F0391 Unspecified dementia with behavioral disturbance: Secondary | ICD-10-CM

## 2018-01-14 NOTE — Progress Notes (Signed)
[facility] Telephone: [phone] Fax: [phone]  PATIENT NAME: Deanna [name] DOB: 03-21-1945 MRN: [id number]  PRIMARY CARE PROVIDER:   Su Ley  REFERRING PROVIDER:  Brayton Caves, PA-C [place], [place] [address]  RESPONSIBLE PARTYJaziah, [name]  [phone]     ASSESSMENT  And RECOMMENDATIONS:  1.Goals of care: Patient is declining in function and is not eating much per staff. End of life care appropriate at this time. Function declining. Appears to be in pain, recommend ATC acetaminophen500 mg tid  for better pain control. Dementia score 7d on FAST scale. Will t/c son Deanna [name] and discuss MOST scope of care. Return 2-4 weeks.  I spent 15  minutes providing this consultation,  from 1445  to 1500. More than 50% of the time in this consultation was spent coordinating communication.   HISTORY OF PRESENT ILLNESS:  Deanna [name] is a 72 y.o. year old female with multiple medical problems including dementia,. Palliative Care was asked to help address goals of care.   CODE STATUS: DNR  PPS: 10% HOSPICE ELIGIBILITY/DIAGNOSIS: TBD  PAST MEDICAL HISTORY:  Past Medical History:  Diagnosis Date  . Dementia ([place]) Dx 2015  . Hyperlipidemia DX 2015  . Hypertension Dx 2015    SOCIAL HX:  Social History   Tobacco Use  . Smoking status: Never Smoker  . Smokeless tobacco: Never Used  Substance Use Topics  . Alcohol use: No    ALLERGIES:  Allergies  Allergen Reactions  . Pork-Derived Products      PERTINENT MEDICATIONS:  Outpatient Encounter Medications as of 01/14/2018  Medication Sig  . acetaminophen (TYLENOL) 500 MG tablet Take 500 mg by mouth every 4 (four) hours as needed.  Marland Kitchen apixaban (ELIQUIS) 2.5 MG TABS tablet Take 1 tablet (2.5 mg total) by mouth 2 (two) times daily.  . bisacodyl (DULCOLAX) 10 MG suppository Place 1 suppository (10 mg total) rectally daily as needed for moderate constipation.  Marland Kitchen  buPROPion (WELLBUTRIN SR) 150 MG 12 hr tablet Take 150 mg by mouth daily.  . ferrous sulfate 325 (65 FE) MG tablet Take 1 tablet (325 mg total) by mouth daily with breakfast.  . guaiFENesin (ROBITUSSIN) 100 MG/5ML SOLN Take 5 mLs by mouth every 6 (six) hours.  Marland Kitchen lactase (LACTAID) 3000 units tablet Take 3,000 Units by mouth 3 (three) times daily with meals.  . mirtazapine (REMERON) 15 MG tablet Take 15 mg by mouth at bedtime.   . Multiple Vitamin (THERA) TABS Take 1 tablet by mouth daily.   Marland Kitchen OLANZapine (ZYPREXA) 2.5 MG tablet Take 2.5 mg by mouth at bedtime.  . polyethylene glycol (MIRALAX / GLYCOLAX) packet Take 17 g by mouth daily as needed (constipation).  Marland Kitchen senna-docusate (SENOKOT-S) 8.6-50 MG tablet Take 1 tablet by mouth 2 (two) times daily.   . sertraline (ZOLOFT) 100 MG tablet Take 100 mg by mouth daily.   . tamsulosin (FLOMAX) 0.4 MG CAPS capsule Take 1 capsule (0.4 mg total) by mouth daily after breakfast.   No facility-administered encounter medications on file as of 01/14/2018.     PHYSICAL EXAM:  VS 98.1-80-13 138/86  96% RA  General: NAD, frail appearing, thin Cardiovascular: regular rate and rhythm, S1S2  Pulmonary: clear all fields Abdomen: soft, nontender, + bowel sounds GU: no suprapubic tenderness, foley catheter Extremities: no edema, no joint deformities Skin: no rashes Neurological: Weakness , non verbal. Awake, does not track with eyes  Deanna Skeeters DNP, AGPCNP-BC

## 2018-01-15 ENCOUNTER — Encounter: Payer: Self-pay | Admitting: Adult Health

## 2018-01-15 ENCOUNTER — Non-Acute Institutional Stay (SKILLED_NURSING_FACILITY): Payer: Medicare Other | Admitting: Adult Health

## 2018-01-15 DIAGNOSIS — S72002D Fracture of unspecified part of neck of left femur, subsequent encounter for closed fracture with routine healing: Secondary | ICD-10-CM | POA: Diagnosis not present

## 2018-01-15 DIAGNOSIS — F0390 Unspecified dementia without behavioral disturbance: Secondary | ICD-10-CM

## 2018-01-15 DIAGNOSIS — D508 Other iron deficiency anemias: Secondary | ICD-10-CM | POA: Diagnosis not present

## 2018-01-15 DIAGNOSIS — F29 Unspecified psychosis not due to a substance or known physiological condition: Secondary | ICD-10-CM

## 2018-01-15 DIAGNOSIS — F339 Major depressive disorder, recurrent, unspecified: Secondary | ICD-10-CM | POA: Diagnosis not present

## 2018-01-15 DIAGNOSIS — E440 Moderate protein-calorie malnutrition: Secondary | ICD-10-CM

## 2018-01-15 DIAGNOSIS — R339 Retention of urine, unspecified: Secondary | ICD-10-CM

## 2018-01-15 NOTE — Progress Notes (Signed)
Location:  [facility] Room Number: 120 Place of Service:  SNF (31) Provider:  Durenda Age, NP  Patient Care Team: Su Ley as PCP - General (Physician Assistant)  Extended Emergency Contact Information Primary Emergency Contact: Clawson,Muhammad Address: [address]          [place], [place] [address] Johnnette Litter of [place] Work Phone: [phone] Mobile Phone: [phone] Relation: Son  Code Status:  DNR  Goals of care: Advanced Directive information Advanced Directives 01/15/2018  Does Patient Have a Medical Advance Directive? Yes  Type of Advance Directive Out of facility DNR (pink MOST or yellow form)  Does patient want to make changes to medical advance directive? No - Patient declined  Copy of [place] in Chart? -  Would patient like information on creating a medical advance directive? -  Pre-existing out of facility DNR order (yellow form or pink MOST form) -     Chief Complaint  Patient presents with  . Medical Management of Chronic Issues    Routine [facility] visit    HPI:  Pt is a 72 y.o. female seen today for medical management of chronic diseases.  She is a short-term rehabilitation resident of [facility].  She has a PMH of dementia with behavioral disturbance, hypertension, and HLD. She was seen at the lounge area. She is nonverbal and does not follow simple verbal instructions. She is currently followed by palliative care. No reported agitation has been reported and had a recent dosage reduction on Zyprexa.   Past Medical History:  Diagnosis Date  . Dementia ([place]) Dx 2015  . Hyperlipidemia DX 2015  . Hypertension Dx 2015   Past Surgical History:  Procedure Laterality Date  . [place]   . HIP ARTHROPLASTY Left 12/13/2017   Procedure: ARTHROPLASTY BIPOLAR HIP (HEMIARTHROPLASTY);  Surgeon: Dorna Leitz, MD;  Location: [facility];  Service:  Orthopedics;  Laterality: Left;    Allergies  Allergen Reactions  . Pork-Derived Products     Outpatient Encounter Medications as of 01/15/2018  Medication Sig  . acetaminophen (TYLENOL) 500 MG tablet Take 500 mg by mouth every 4 (four) hours.   Marland Kitchen apixaban (ELIQUIS) 2.5 MG TABS tablet Take 1 tablet (2.5 mg total) by mouth 2 (two) times daily.  Marland Kitchen buPROPion (WELLBUTRIN SR) 150 MG 12 hr tablet Take 150 mg by mouth daily.  . ferrous sulfate 325 (65 FE) MG tablet Take 1 tablet (325 mg total) by mouth daily with breakfast.  . guaiFENesin (ROBITUSSIN) 100 MG/5ML SOLN Take 5 mLs by mouth every 6 (six) hours.  Marland Kitchen lactase (LACTAID) 3000 units tablet Take 3,000 Units by mouth 3 (three) times daily with meals.  . mirtazapine (REMERON) 15 MG tablet Take 15 mg by mouth at bedtime.   . Multiple Vitamin (THERA) TABS Take 1 tablet by mouth daily.   Marland Kitchen NUTRITIONAL SUPPLEMENT LIQD Take 120 mLs by mouth 3 (three) times daily. MedPass  . OLANZapine (ZYPREXA) 2.5 MG tablet Take 2.5 mg by mouth at bedtime.  . polyethylene glycol (MIRALAX / GLYCOLAX) packet Take 17 g by mouth daily as needed (constipation).  Marland Kitchen senna-docusate (SENOKOT-S) 8.6-50 MG tablet Take 1 tablet by mouth daily.   . sertraline (ZOLOFT) 100 MG tablet Take 100 mg by mouth daily.   . tamsulosin (FLOMAX) 0.4 MG CAPS capsule Take 1 capsule (0.4 mg total) by mouth daily after breakfast.  . [DISCONTINUED] bisacodyl (DULCOLAX) 10 MG suppository Place 1 suppository (10  mg total) rectally daily as needed for moderate constipation.   No facility-administered encounter medications on file as of 01/15/2018.     Review of Systems  Unable to obtain due to dementia     Immunization History  Administered Date(s) Administered  . Influenza,inj,Quad PF,6+ Mos 12/16/2013  . PPD Test 11/07/2015  . Tdap 09/01/2016   Pertinent  Health Maintenance Due  Topic Date Due  . COLONOSCOPY  04/23/1997  . PNA vac Low Risk Adult (1 of 2 - PCV13) 04/23/2012  .  INFLUENZA VACCINE  10/03/2017  . MAMMOGRAM  Discontinued  . DEXA SCAN  Discontinued   Fall Risk  06/15/2014 12/16/2013  Falls in the past year? No No      Vitals:   01/15/18 0916  BP: 132/74  Pulse: 77  Resp: 18  Temp: (!) 97.2 F (36.2 C)  TempSrc: Oral  SpO2: 93%  Weight: 106 lb 12.8 oz (48.4 kg)  Height: 5\' 6"  (1.676 m)   Body mass index is 17.24 kg/m.  Physical Exam  GENERAL APPEARANCE: In no acute distress.  SKIN:  Skin is warm and dry. MOUTH and THROAT: Lips are without lesions. Oral mucosa is moist and without lesions.  CARDIAC: RRR, no murmur,no extra heart sounds, no edema GI: Abdomen soft, normal BS, no masses, no tenderness NEUROLOGICAL: There is no tremor. Non-verbal PSYCHIATRIC:  Affect and behavior are appropriate   Labs reviewed: Recent Labs    12/13/17 0527 12/14/17 0427 12/15/17 0344 01/09/18  NA 145 141 142 145  K 3.7 4.1 3.2* 4.0  CL 106 103 105  --   CO2 30 28 30   --   GLUCOSE 96 117* 114*  --   BUN 15 10 8  24*  CREATININE 0.77 0.77 0.62 0.8  CALCIUM 9.0 8.4* 8.1*  --    Recent Labs    12/02/17 1228 12/14/17 0427 12/15/17 0344  AST 24 27 22   ALT 16 16 13   ALKPHOS 64 57 49  BILITOT 0.6 1.0 1.1  PROT 6.6 6.2* 5.3*  ALBUMIN 3.8 3.5 2.8*   Recent Labs    12/12/17 1825 12/14/17 0427 12/15/17 0344 12/20/17 12/25/17 01/09/18  WBC 12.1* 10.6* 10.1 15.8 13.0 12.4  NEUTROABS 9.5* 8.6* 7.6 13 11 10   HGB 13.5 11.6* 10.5* 11.8* 12.6 11.1*  HCT 43.5 37.7 33.8* 38 38 35*  MCV 88.8 88.5 87.6  --   --   --   PLT 614* 656* 641* 959* 1,198* 760*   Lab Results  Component Value Date   TSH 1.161 09/05/2017    Lab Results  Component Value Date   CHOL 220 (H) 12/16/2013   HDL 61 12/16/2013   LDLCALC 139 (H) 12/16/2013   TRIG 98 12/16/2013   CHOLHDL 3.6 12/16/2013    Assessment/Plan  1. Closed fracture of left hip with routine healing, subsequent encounter -S/P repair 12/12/2017, continue Eliquis 2.5 mg 1 tab twice a day for DVT  prophylaxis, acetaminophen 500 mg 1 tab every 4 hours as needed for pain, continue PT and OT for therapeutic and strengthening exercises   2. Malnutrition of moderate degree - Body mass index is 17.24 kg/m.  Will add Magic cup twice daily and continue med Pass 3 times daily, continue mirtazapine 15 mg 1 tab nightly for appetite stimulation  Last Weight  Most recent update: 01/15/2018  9:16 AM   Weight  48.4 kg (106 lb 12.8 oz)            3. Other iron deficiency  anemia -continue ferrous sulfate 325 mg 1 tab daily Lab Results  Component Value Date   HGB 11.1 (A) 01/09/2018     4. Depression, recurrent ([place]) -nonverbal with no inappropriate continue sertraline 100 mg 1 tab daily and bupropion SR 150 mg daily   5. Psychosis, unspecified psychosis type ([place]) -had a recent dosage reduction to olanzapine 2.5 mg nightly   6. Urinary retention -continue tamsulosin 0.4 mg 1 capsule daily   7. Dementia without behavioral disturbance, unspecified dementia type ([place]) -continue supportive care and fall precautions      Family/ staff Communication: Discussed plan of care with charge nurse.  Labs/tests ordered:  None  Goals of care:   Short-term rehabilitation.   Durenda Age, NP [facility] [phone] (Monday-Friday 8:00 a.m. - 5:00 p.m.) [phone] (after hours)

## 2018-01-21 ENCOUNTER — Non-Acute Institutional Stay: Payer: Medicare Other | Admitting: Primary Care

## 2018-01-21 ENCOUNTER — Encounter: Payer: Self-pay | Admitting: Internal Medicine

## 2018-01-21 ENCOUNTER — Non-Acute Institutional Stay (SKILLED_NURSING_FACILITY): Payer: Medicare Other | Admitting: Internal Medicine

## 2018-01-21 DIAGNOSIS — Z7189 Other specified counseling: Secondary | ICD-10-CM

## 2018-01-21 DIAGNOSIS — R509 Fever, unspecified: Secondary | ICD-10-CM

## 2018-01-21 DIAGNOSIS — Z515 Encounter for palliative care: Secondary | ICD-10-CM

## 2018-01-21 DIAGNOSIS — R0689 Other abnormalities of breathing: Secondary | ICD-10-CM | POA: Diagnosis not present

## 2018-01-21 DIAGNOSIS — R0902 Hypoxemia: Secondary | ICD-10-CM

## 2018-01-21 DIAGNOSIS — F0391 Unspecified dementia with behavioral disturbance: Secondary | ICD-10-CM

## 2018-01-21 DIAGNOSIS — S72002D Fracture of unspecified part of neck of left femur, subsequent encounter for closed fracture with routine healing: Secondary | ICD-10-CM

## 2018-01-21 NOTE — Progress Notes (Signed)
[facility] Telephone: [phone] Fax: [phone]  PATIENT NAME: Deanna [name] DOB: 26-Dec-1945 MRN: [id number]  PRIMARY CARE PROVIDER:     Unice Cobble MD REFERRING PROVIDER:  Brayton Caves, PA-C [place], [place] [address]  RESPONSIBLE PARTY:   Linsi, Humann  [phone]    ASSESSMENT and RECOMMENDATIONS:   1.Goals of care:Hospice admission pending.  Pt declining, 10 % PPS since last week. Not eating, fever.SNF has ordered hospice.Discussed with hospice RN who will call office to expedite. Order obtained and sent to referral for admission this pm. SNF has spoken with POA and he requests hospice.  D/c from palliative at election of hospice care.   I spent 25  minutes providing this consultation,  From 12:50  to 13:15. More than 50% of the time in this consultation was spent coordinating communication.   HISTORY OF PRESENT ILLNESS:  Deanna [name] is a 72 y.o. year old female with multiple medical problems including dementia, protein calorie malnutrition. Palliative Care was asked to help address goals of care.   CODE STATUS: DNR  PPS: 10% HOSPICE ELIGIBILITY/DIAGNOSIS: yes PAST MEDICAL HISTORY:  Past Medical History:  Diagnosis Date  . Dementia ([place]) Dx 2015  . Hyperlipidemia DX 2015  . Hypertension Dx 2015    SOCIAL HX:  Social History   Tobacco Use  . Smoking status: Never Smoker  . Smokeless tobacco: Never Used  Substance Use Topics  . Alcohol use: No    ALLERGIES:  Allergies  Allergen Reactions  . Pork-Derived Products      PERTINENT MEDICATIONS:  Outpatient Encounter Medications as of 01/21/2018  Medication Sig  . acetaminophen (TYLENOL) 500 MG tablet Take 500 mg by mouth every 4 (four) hours.   Marland Kitchen apixaban (ELIQUIS) 2.5 MG TABS tablet Take 1 tablet (2.5 mg total) by mouth 2 (two) times daily.  Marland Kitchen buPROPion (WELLBUTRIN SR) 150 MG 12 hr tablet Take 150 mg by mouth daily.  . ferrous sulfate  325 (65 FE) MG tablet Take 1 tablet (325 mg total) by mouth daily with breakfast.  . lactase (LACTAID) 3000 units tablet Take 3,000 Units by mouth 3 (three) times daily with meals.  . mirtazapine (REMERON) 15 MG tablet Take 15 mg by mouth at bedtime.   . Multiple Vitamin (THERA) TABS Take 1 tablet by mouth daily.   Marland Kitchen NUTRITIONAL SUPPLEMENT LIQD Take 120 mLs by mouth 3 (three) times daily. MedPass  . Nutritional Supplements (NUTRITIONAL SUPPLEMENT PO) Take 1 each by mouth 2 (two) times daily. Magic Cup  . polyethylene glycol (MIRALAX / GLYCOLAX) packet Take 17 g by mouth daily as needed (constipation).  Marland Kitchen senna-docusate (SENOKOT-S) 8.6-50 MG tablet Take 1 tablet by mouth 2 (two) times daily.   . sertraline (ZOLOFT) 100 MG tablet Take 100 mg by mouth daily.   . tamsulosin (FLOMAX) 0.4 MG CAPS capsule Take 1 capsule (0.4 mg total) by mouth daily after breakfast.   No facility-administered encounter medications on file as of 01/21/2018.     PHYSICAL EXAM:  VS 98.4-115-18 103/62 O2-95% 2 L oxygen General: ill and  frail appearing, thin Cardiovascular: irregular rhythm and rapid rate Pulmonary: moving air poorly Abdomen: soft, nontender, + bowel sounds Extremities: no edema, no joint deformities Skin: no rashes Neurological: Weakness, Non verbal, min. Responsive  Cyndia Skeeters DNP, AGPCNP-BC

## 2018-01-21 NOTE — Assessment & Plan Note (Addendum)
Discussion with her son Mo [phone]) Hospice referral for comfort and dignity as per her written wishes when she became aware of early stage neurocognitive deficits

## 2018-01-21 NOTE — Progress Notes (Signed)
    NURSING HOME LOCATION:  [facility] ROOM NUMBER:  120-A  CODE STATUS:  DNR  PCP:  Brayton Caves, PA-C  [place] [address]  This is a nursing facility follow up for specific acute issue of fever and cough.  Interim medical record and care since last [place] visit was updated with review of diagnostic studies and change in clinical status since last visit were documented.  HPI: Staff reported temperature of 101.3 rectally today with tachycardia with heart rates in the 120s.  O2 sat was 78% and oxygen was increased to 4 L with increase in the O2 sats to 89%.  They reported moist rales and rhonchi.  Suctioning revealed large amounts of yellow-tan secretions. Her son had noted that she was struggling to eat. he also described nonsensical mumbling about her long dead parents.  He validates that she has become more unresponsive with the acute findings today.  Physical exam:  Pertinent or positive findings: She appears chronically ill and cachectic.  She was nonresponsive verbally.  She did grasp her son's hand.  Intraoral cavity cannot be evaluated as she clenched her jaw.  She appears edentulous.  A distant tachycardia is present at the left sternal border.  Breath sounds are decreased except for rales and rhonchi on the left anterior chest.  The aorta is palpable and questionably clinically enlarged with a bounding pulse.  Dorsalis pedis pulses are decreased, posterior tibial pulses are faintly palpable.  She has a healing decubitus with eschar on the right medial calf.  General appearance:  no acute distress clinically despite VS Lymphatic: No lymphadenopathy about the head, neck, axilla. Eyes: No conjunctival inflammation or lid edema is present. There is no scleral icterus. Ears:  External ear exam shows no significant lesions or deformities.   Nose:  External nasal examination shows no deformity or inflammation. Nasal mucosa are pink and moist without  lesions, exudates Neck:  No thyromegaly, masses, tenderness noted.    Heart:  No murmur, click, rub .  Abdomen: Bowel sounds are normal. Abdomen is soft and nontender with no organomegaly, hernias, masses. GU: Deferred  Extremities:  No cyanosis, clubbing, edema  No significant rash.  See summary under each active problem in the Problem List with associated updated therapeutic plan

## 2018-01-21 NOTE — Patient Instructions (Addendum)
See assessment and plan under each diagnosis in the problem list and acutely for this visit Total time 42 minutes; greater than 50% of the visit spent counseling patient's son and coordinating care for problems addressed at this encounter

## 2018-02-02 DEATH — deceased

## 2019-04-15 IMAGING — MR MR HEAD W/O CM
9 of 10 series · 37 of 48 positions shown · non-contrast
Comparison: Head CT from 2 days ago.  Brain MRI 04/06/2014

CLINICAL DATA: Altered level of consciousness.  Evaluate for CVA

EXAM:
MRI HEAD WITHOUT CONTRAST
TECHNIQUE: Multiplanar, multiecho pulse sequences of the brain and surrounding
structures were obtained without intravenous contrast.

[Series 3: DWI · axial · 3.0mm · 1.09mm/px · z∈[-41,+103]mm · 8 of 100 slices shown (1 of 4)]
[im 1/100]
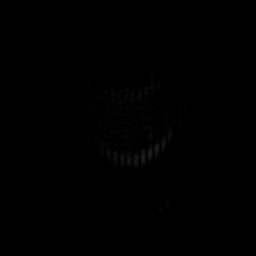
[im 12/100]
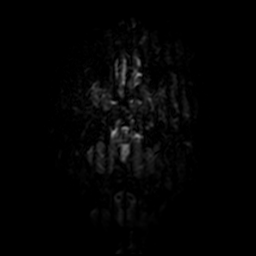
[im 34/100]
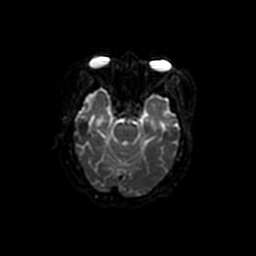
[im 45/100]
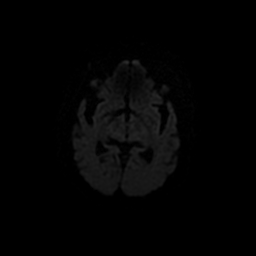
[im 56/100]
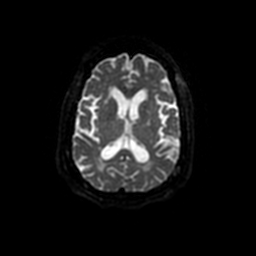
[im 67/100]
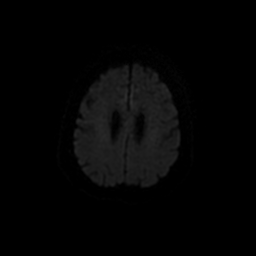
[im 89/100]
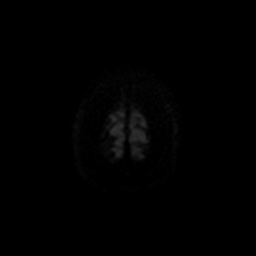
[im 100/100]
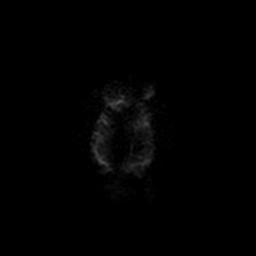

[Series 4: T1 · sagittal · 5.0mm · 0.47mm/px · 2 of 24 slices shown]
[im 1/24]
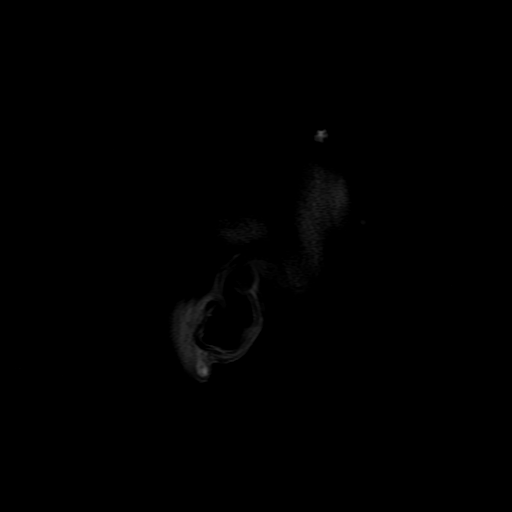
[im 24/24]
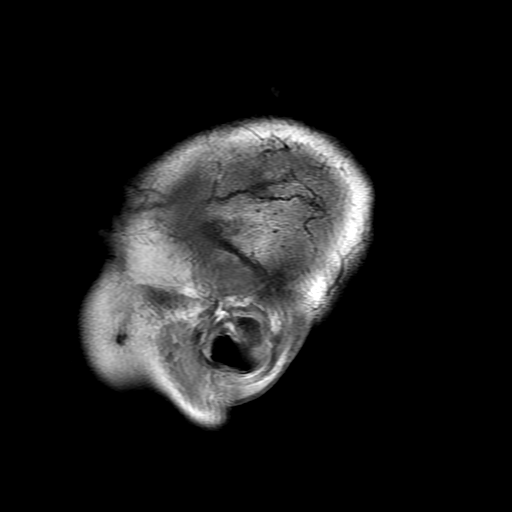

[Series 5: DWI · coronal · 4.0mm · 1.09mm/px · 7 of 64 slices shown (2 of 4)]
[im 1/64]
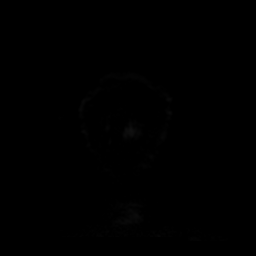
[im 11/64]
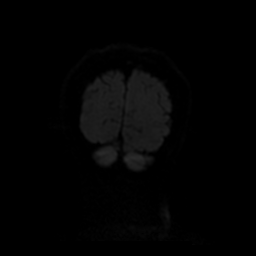
[im 22/64]
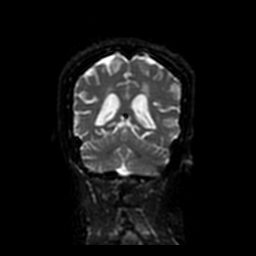
[im 32/64]
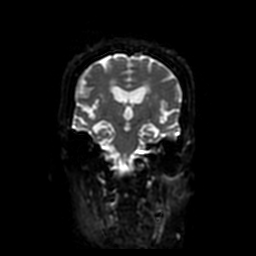
[im 43/64]
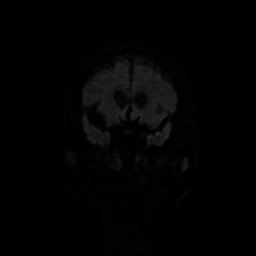
[im 53/64]
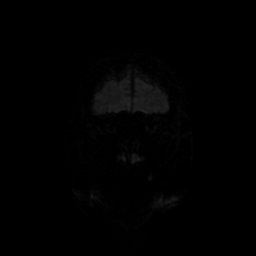
[im 64/64]
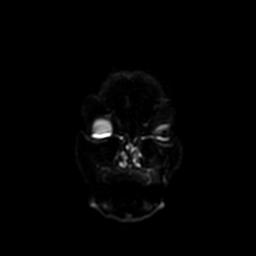

[Series 6: T2 · axial · 5.0mm · 0.43mm/px · z∈[-50,+96]mm · 3 of 26 slices shown (1 of 2)]
[im 1/26]
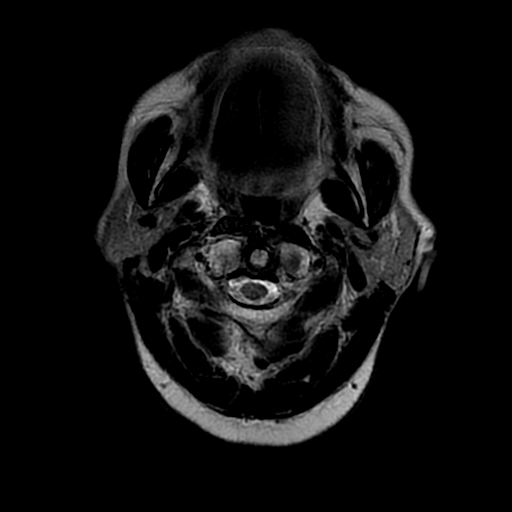
[im 13/26]
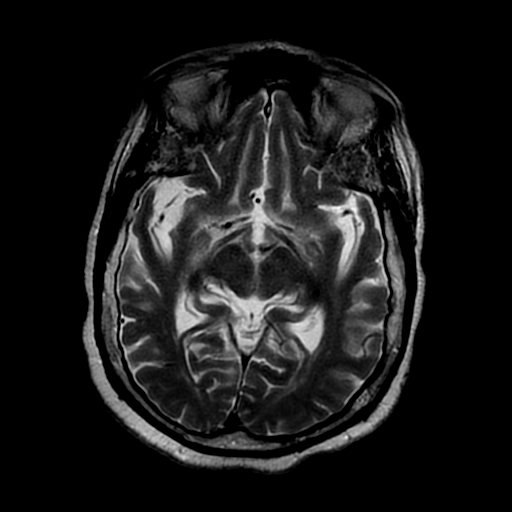
[im 26/26]
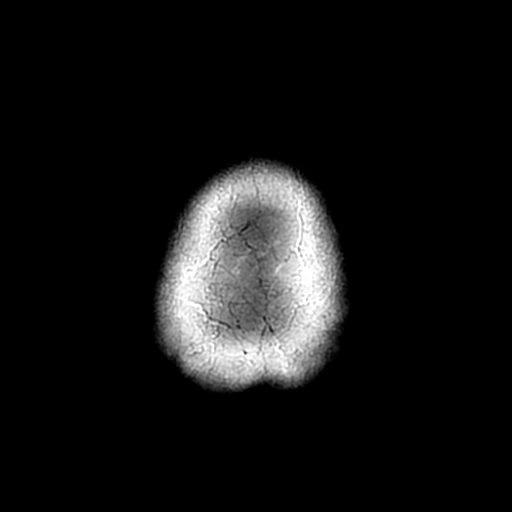

[Series 7: FLAIR · axial · 5.0mm · 0.43mm/px · z∈[-50,+96]mm · 3 of 26 slices shown]
[im 1/26]
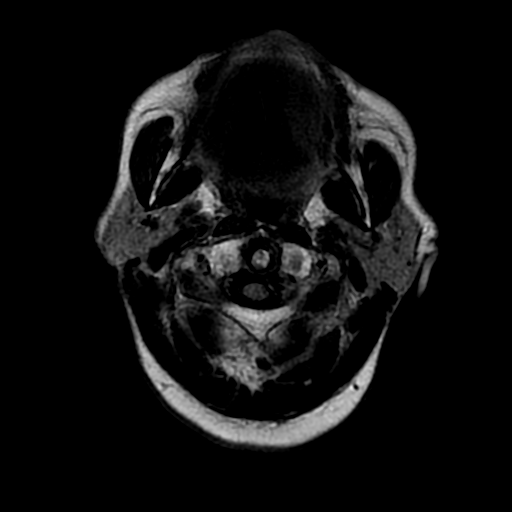
[im 13/26]
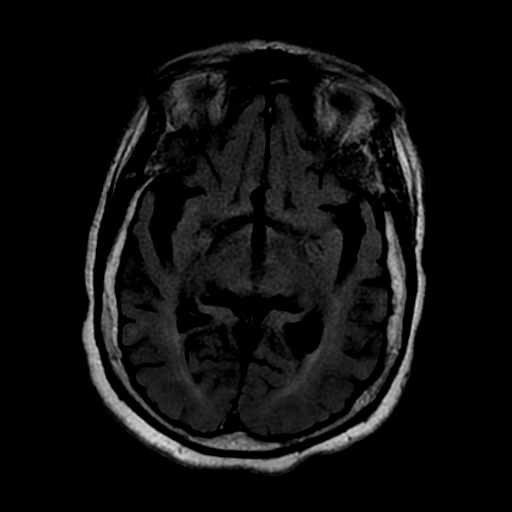
[im 26/26]
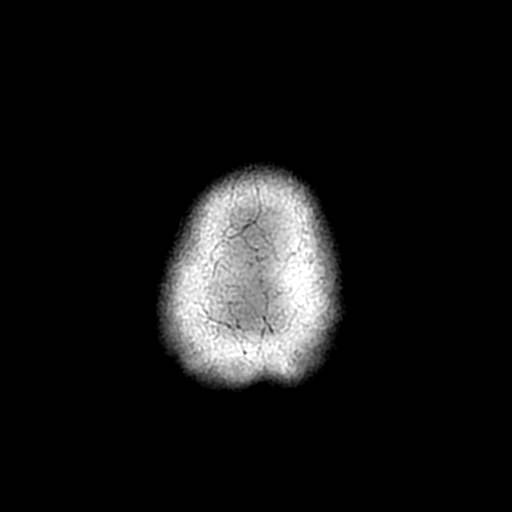

[Series 8: ax mpgr · axial · 5.0mm · 0.43mm/px · 1 of 24 slices shown]
[im 1/24]
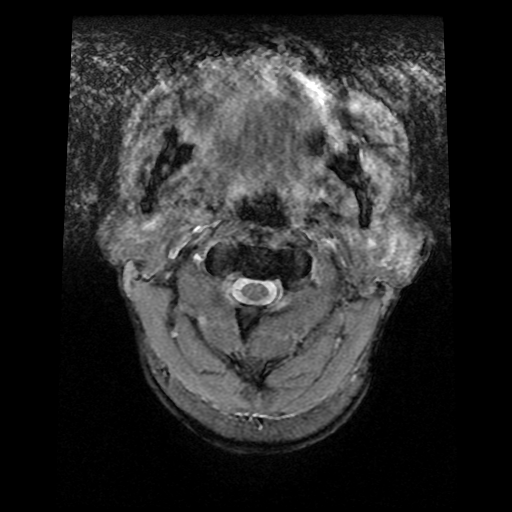

[Series 10: T2 · coronal · 5.0mm · 0.45mm/px · 3 of 24 slices shown (2 of 2)]
[im 1/24]
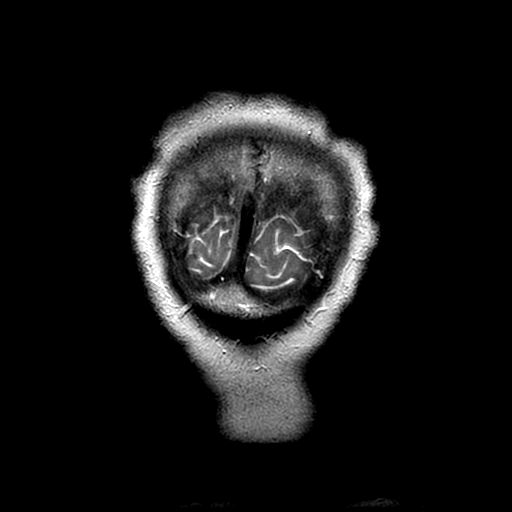
[im 12/24]
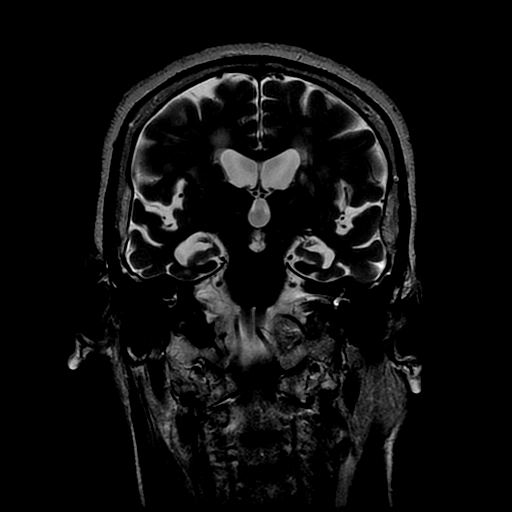
[im 24/24]
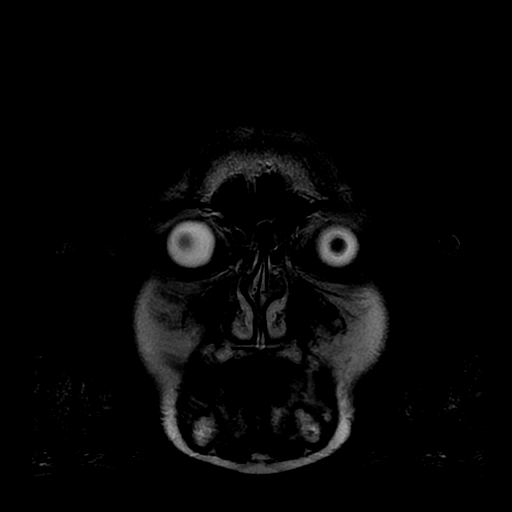

[Series 300: DWI · axial · 3.0mm · 1.09mm/px · z∈[-41,+103]mm · 6 of 50 slices shown (3 of 4)]
[im 1/50]
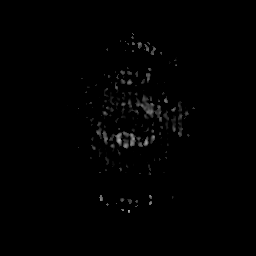
[im 10/50]
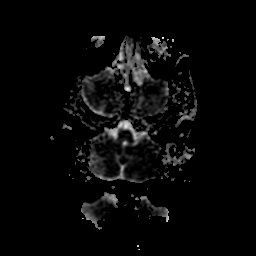
[im 20/50]
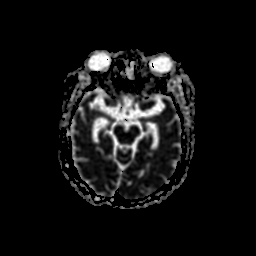
[im 30/50]
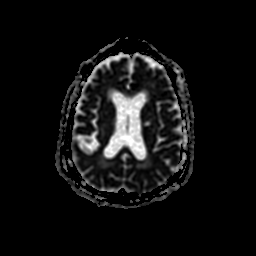
[im 40/50]
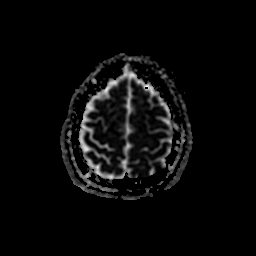
[im 50/50]
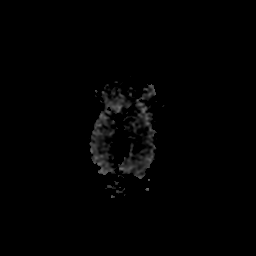

[Series 500: DWI · coronal · 4.0mm · 1.09mm/px · 4 of 32 slices shown (4 of 4)]
[im 1/32]
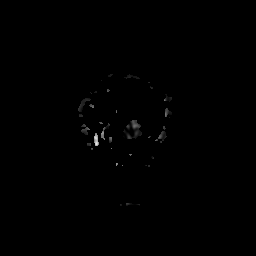
[im 11/32]
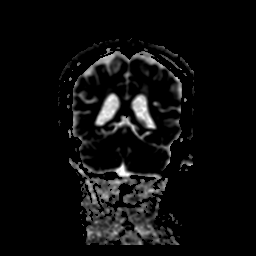
[im 21/32]
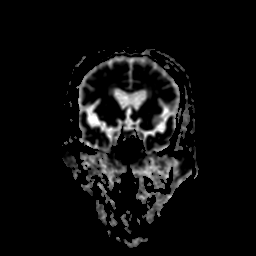
[im 32/32]
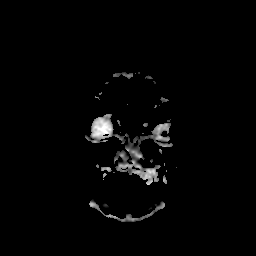

[37 of 48 positions shown; findings below may reference images not displayed]

FINDINGS: Brain: No acute infarction, hemorrhage, hydrocephalus, extra-axial
collection or mass lesion.

Atrophy, advanced in the medial temporal lobes, significantly
progressed from 0263.

Moderate chronic small vessel ischemic gliosis in the cerebral white
matter. Remote small vessel infarct in the left corona radiata and
left cerebellum.

Vascular: Major flow voids are preserved

Skull and upper cervical spine: No evidence of marrow lesion

Sinuses/Orbits: Negative
IMPRESSION: 1. No acute finding.
2. History of dementia with advanced and progressive medial temporal
atrophy suggesting Alzheimer's.
3. Moderate chronic small vessel ischemia.
